# Patient Record
Sex: Female | Born: 1970 | Race: White | Hispanic: Yes | Marital: Married | Smoking: Former smoker
Health system: Southern US, Community
[De-identification: ages and names within clinical notes are randomized; demographics above are authoritative.]

## PROBLEM LIST (undated history)

## (undated) DIAGNOSIS — F329 Major depressive disorder, single episode, unspecified: Secondary | ICD-10-CM

## (undated) DIAGNOSIS — E119 Type 2 diabetes mellitus without complications: Secondary | ICD-10-CM

## (undated) DIAGNOSIS — Z9889 Other specified postprocedural states: Secondary | ICD-10-CM

## (undated) DIAGNOSIS — G5603 Carpal tunnel syndrome, bilateral upper limbs: Secondary | ICD-10-CM

## (undated) DIAGNOSIS — R112 Nausea with vomiting, unspecified: Secondary | ICD-10-CM

## (undated) DIAGNOSIS — G4730 Sleep apnea, unspecified: Secondary | ICD-10-CM

## (undated) DIAGNOSIS — K219 Gastro-esophageal reflux disease without esophagitis: Secondary | ICD-10-CM

## (undated) DIAGNOSIS — F419 Anxiety disorder, unspecified: Secondary | ICD-10-CM

## (undated) DIAGNOSIS — F32A Depression, unspecified: Secondary | ICD-10-CM

## (undated) DIAGNOSIS — Z8489 Family history of other specified conditions: Secondary | ICD-10-CM

## (undated) DIAGNOSIS — E669 Obesity, unspecified: Secondary | ICD-10-CM

## (undated) DIAGNOSIS — R011 Cardiac murmur, unspecified: Secondary | ICD-10-CM

## (undated) DIAGNOSIS — E039 Hypothyroidism, unspecified: Secondary | ICD-10-CM

## (undated) DIAGNOSIS — R42 Dizziness and giddiness: Secondary | ICD-10-CM

## (undated) DIAGNOSIS — K760 Fatty (change of) liver, not elsewhere classified: Secondary | ICD-10-CM

## (undated) DIAGNOSIS — E785 Hyperlipidemia, unspecified: Secondary | ICD-10-CM

## (undated) DIAGNOSIS — T8859XA Other complications of anesthesia, initial encounter: Secondary | ICD-10-CM

## (undated) DIAGNOSIS — I10 Essential (primary) hypertension: Secondary | ICD-10-CM

## (undated) DIAGNOSIS — T4145XA Adverse effect of unspecified anesthetic, initial encounter: Secondary | ICD-10-CM

## (undated) HISTORY — PX: ABDOMINAL HYSTERECTOMY: SHX81

## (undated) HISTORY — DX: Essential (primary) hypertension: I10

## (undated) HISTORY — PX: HYSTERECTOMY ABDOMINAL WITH SALPINGECTOMY: SHX6725

## (undated) HISTORY — PX: OVARIAN CYST REMOVAL: SHX89

## (undated) HISTORY — DX: Sleep apnea, unspecified: G47.30

## (undated) HISTORY — DX: Obesity, unspecified: E66.9

## (undated) HISTORY — DX: Anxiety disorder, unspecified: F41.9

## (undated) HISTORY — DX: Depression, unspecified: F32.A

## (undated) HISTORY — PX: TUBAL LIGATION: SHX77

## (undated) HISTORY — DX: Carpal tunnel syndrome, bilateral upper limbs: G56.03

## (undated) HISTORY — PX: EYE SURGERY: SHX253

## (undated) HISTORY — DX: Major depressive disorder, single episode, unspecified: F32.9

## (undated) HISTORY — DX: Hyperlipidemia, unspecified: E78.5

---

## 1993-03-18 HISTORY — PX: CHOLECYSTECTOMY: SHX55

## 2001-03-18 HISTORY — PX: BREAST SURGERY: SHX581

## 2001-03-18 HISTORY — PX: TONSILLECTOMY AND ADENOIDECTOMY: SUR1326

## 2006-07-25 ENCOUNTER — Ambulatory Visit: Payer: Self-pay | Admitting: Urology

## 2006-08-04 ENCOUNTER — Ambulatory Visit: Payer: Self-pay | Admitting: Unknown Physician Specialty

## 2006-08-12 ENCOUNTER — Ambulatory Visit: Payer: Self-pay | Admitting: Unknown Physician Specialty

## 2006-08-27 ENCOUNTER — Ambulatory Visit: Payer: Self-pay | Admitting: Unknown Physician Specialty

## 2006-12-10 ENCOUNTER — Ambulatory Visit: Payer: Self-pay | Admitting: Internal Medicine

## 2008-07-18 ENCOUNTER — Ambulatory Visit: Payer: Self-pay | Admitting: Internal Medicine

## 2008-08-12 ENCOUNTER — Ambulatory Visit: Payer: Self-pay | Admitting: Internal Medicine

## 2008-08-19 ENCOUNTER — Ambulatory Visit: Payer: Self-pay | Admitting: Internal Medicine

## 2009-03-18 DIAGNOSIS — G4730 Sleep apnea, unspecified: Secondary | ICD-10-CM

## 2009-03-18 HISTORY — DX: Sleep apnea, unspecified: G47.30

## 2009-11-16 LAB — HM PAP SMEAR

## 2011-03-26 ENCOUNTER — Ambulatory Visit (INDEPENDENT_AMBULATORY_CARE_PROVIDER_SITE_OTHER): Payer: 59 | Admitting: Internal Medicine

## 2011-03-26 ENCOUNTER — Encounter: Payer: Self-pay | Admitting: Internal Medicine

## 2011-03-26 VITALS — BP 134/90 | HR 76 | Temp 98.3°F | Wt 205.0 lb

## 2011-03-26 DIAGNOSIS — Z124 Encounter for screening for malignant neoplasm of cervix: Secondary | ICD-10-CM

## 2011-03-26 DIAGNOSIS — F329 Major depressive disorder, single episode, unspecified: Secondary | ICD-10-CM | POA: Insufficient documentation

## 2011-03-26 DIAGNOSIS — F32A Depression, unspecified: Secondary | ICD-10-CM | POA: Insufficient documentation

## 2011-03-26 DIAGNOSIS — I10 Essential (primary) hypertension: Secondary | ICD-10-CM

## 2011-03-26 DIAGNOSIS — Z87891 Personal history of nicotine dependence: Secondary | ICD-10-CM | POA: Insufficient documentation

## 2011-03-26 DIAGNOSIS — G5603 Carpal tunnel syndrome, bilateral upper limbs: Secondary | ICD-10-CM | POA: Insufficient documentation

## 2011-03-26 DIAGNOSIS — E785 Hyperlipidemia, unspecified: Secondary | ICD-10-CM

## 2011-03-26 DIAGNOSIS — E1169 Type 2 diabetes mellitus with other specified complication: Secondary | ICD-10-CM | POA: Insufficient documentation

## 2011-03-26 DIAGNOSIS — Z716 Tobacco abuse counseling: Secondary | ICD-10-CM

## 2011-03-26 DIAGNOSIS — Z7189 Other specified counseling: Secondary | ICD-10-CM

## 2011-03-26 DIAGNOSIS — E669 Obesity, unspecified: Secondary | ICD-10-CM | POA: Insufficient documentation

## 2011-03-26 DIAGNOSIS — G4733 Obstructive sleep apnea (adult) (pediatric): Secondary | ICD-10-CM | POA: Insufficient documentation

## 2011-03-26 LAB — BASIC METABOLIC PANEL
BUN: 10 mg/dL (ref 6–23)
Calcium: 9.3 mg/dL (ref 8.4–10.5)
Chloride: 104 mEq/L (ref 96–112)
Creatinine, Ser: 0.7 mg/dL (ref 0.4–1.2)
GFR: 95.66 mL/min (ref >60.00)

## 2011-03-26 LAB — MICROALBUMIN / CREATININE URINE RATIO
Creatinine,U: 41.2 mg/dL
Microalb Creat Ratio: 1.5 mg/g (ref 0.0–30.0)
Microalb, Ur: 0.6 mg/dL (ref 0.0–1.9)

## 2011-03-26 MED ORDER — VARENICLINE TARTRATE 0.5 MG PO TABS: 0.5000 mg | ORAL_TABLET | Freq: Two times a day (BID) | ORAL | Status: AC

## 2011-03-26 MED ORDER — HYDROCHLOROTHIAZIDE 25 MG PO TABS: 25.0000 mg | ORAL_TABLET | Freq: Every day | ORAL | Status: DC

## 2011-03-26 NOTE — Patient Instructions (Signed)
Your goal bp is < 130/80.   Return in one month fasting labs.

## 2011-03-26 NOTE — Assessment & Plan Note (Signed)
With prior elevated readings,   Checking BMET and urine for protein.  Starting with hctz.  Repeat labs in one month. Has history of OSA but wears her CPAP nightly.

## 2011-03-26 NOTE — Progress Notes (Signed)
Subjective:    Patient ID: Christina Stafford, female    DOB: Mar 06, 1971, 41 y.o.   MRN: [id number]  HPI  41 yo white female with history of obesity, elevated blood pressure readings without diagnosis of hypertension presents with recent development of visual changes, occurring on 3 separate occasions.  The fiirst occurred in a fastinng state. 2nd during dinner  Hared to read.  3rd time not sure of circumstances.  She describes the change is a waviness ot her vision,  Affected both eyes,. And lasted 10 to 15 minutes resolved spontaneously.  No headaches, sinus congestion or dizzy spells. She ahs an eye appt next week.  Last episode occurred about 2 weeks ago.  Past Medical History  Diagnosis Date  . Sleep apnea 2011    using CPAP 6 cm H20  [facility] Sleep Study  . Carpal tunnel syndrome on both sides   . Depression   . Hyperlipidemia   . Hypertension   . Obesity    No current outpatient prescriptions on file prior to visit.    Review of Systems  Constitutional: Negative for fever, chills and unexpected weight change.  HENT: Negative for hearing loss, ear pain, nosebleeds, congestion, sore throat, facial swelling, rhinorrhea, sneezing, mouth sores, trouble swallowing, neck pain, neck stiffness, voice change, postnasal drip, sinus pressure, tinnitus and ear discharge.   Eyes: Negative for pain, discharge, redness and visual disturbance.  Respiratory: Negative for cough, chest tightness, shortness of breath, wheezing and stridor.   Cardiovascular: Negative for chest pain, palpitations and leg swelling.  Musculoskeletal: Negative for myalgias and arthralgias.  Skin: Negative for color change and rash.  Neurological: Negative for dizziness, weakness, light-headedness and headaches.  Hematological: Negative for adenopathy.   BP 134/90  Pulse 76  Temp(Src) 98.3 F (36.8 C) (Oral)  Wt 205 lb (92.987 kg)     Objective:   Physical Exam  Constitutional: She is oriented to person, place, and  time. She appears well-developed and well-nourished.  HENT:  Mouth/Throat: Oropharynx is clear and moist.  Eyes: EOM are normal. Pupils are equal, round, and reactive to light. No scleral icterus.  Neck: Normal range of motion. Neck supple. No JVD present. No thyromegaly present.  Cardiovascular: Normal rate, regular rhythm, normal heart sounds and intact distal pulses.   Pulmonary/Chest: Effort normal and breath sounds normal.  Abdominal: Soft. Bowel sounds are normal. She exhibits no mass. There is no tenderness.  Musculoskeletal: Normal range of motion. She exhibits no edema.  Lymphadenopathy:    She has no cervical adenopathy.  Neurological: She is alert and oriented to person, place, and time.  Skin: Skin is warm and dry.  Psychiatric: She has a normal mood and affect.      Assessment & Plan:   Tobacco abuse counseling She has been using tobacco intermittently for 20 years and has had prior cessation for months at a time. Spent 5 minutes discussing mehtods of quitting.  Rx for chantix starter pack given with discussion of possible adverse effects done.   Hypertension With prior elevated readings,   Checking BMET and urine for protein.  Starting with hctz.  Repeat labs in one month. Has history of OSA but wears her CPAP nightly.     Updated Medication List Outpatient Encounter Prescriptions as of 03/26/2011  Medication Sig Dispense Refill  . cetirizine (ZYRTEC) 10 MG tablet Take one by mouth daily when needed.       Marland Kitchen omeprazole (PRILOSEC) 20 MG capsule Take 20 mg by mouth daily.        Marland Kitchen  hydrochlorothiazide (HYDRODIURIL) 25 MG tablet Take 1 tablet (25 mg total) by mouth daily.  30 tablet  11  . varenicline (CHANTIX) 0.5 MG tablet Take 1 tablet (0.5 mg total) by mouth 2 (two) times daily. Qty sufficient for starting month pack  60 tablet  1

## 2011-03-26 NOTE — Assessment & Plan Note (Addendum)
She has been using tobacco intermittently for 20 years and has had prior cessation for months at a time. Spent 5 minutes discussing mehtods of quitting.  Rx for chantix starter pack given with discussion of possible adverse effects done.

## 2011-03-29 ENCOUNTER — Telehealth: Payer: Self-pay | Admitting: *Deleted

## 2011-03-29 NOTE — Telephone Encounter (Signed)
Advised pt, she will try this and will call and let us know how taking half a day works.

## 2011-03-29 NOTE — Telephone Encounter (Signed)
Pt was given hctz on Tuesday.  She started this on Wednesday and had no problems.  She took it yesterday with lunch and felt dizzy, lightheaded and had nauseated.  She has not taken any today.  She's taking 25 mg's a day.  Should she decrease the dose until she gets used to it?

## 2011-03-29 NOTE — Telephone Encounter (Signed)
Yes she can cut it in half and take it once daily. If still a problem, stop it.

## 2011-04-02 ENCOUNTER — Telehealth: Payer: Self-pay | Admitting: *Deleted

## 2011-04-02 NOTE — Telephone Encounter (Signed)
Patient called to let you know that she has been taking 1/2 of the hctz since Friday and so far everything is good. She has not had any trouble with her BP or dizziness. She is asking if she should continue taking it this way.

## 2011-04-02 NOTE — Telephone Encounter (Signed)
Yes, continue 1/2 tablet daily

## 2011-04-03 NOTE — Telephone Encounter (Signed)
Patient notified

## 2011-05-09 ENCOUNTER — Encounter: Payer: Self-pay | Admitting: Internal Medicine

## 2011-05-09 ENCOUNTER — Ambulatory Visit (INDEPENDENT_AMBULATORY_CARE_PROVIDER_SITE_OTHER): Payer: 59 | Admitting: Internal Medicine

## 2011-05-09 DIAGNOSIS — R51 Headache: Secondary | ICD-10-CM

## 2011-05-09 DIAGNOSIS — Z716 Tobacco abuse counseling: Secondary | ICD-10-CM

## 2011-05-09 DIAGNOSIS — Z7189 Other specified counseling: Secondary | ICD-10-CM

## 2011-05-09 DIAGNOSIS — R519 Headache, unspecified: Secondary | ICD-10-CM

## 2011-05-09 DIAGNOSIS — E785 Hyperlipidemia, unspecified: Secondary | ICD-10-CM

## 2011-05-09 DIAGNOSIS — I10 Essential (primary) hypertension: Secondary | ICD-10-CM

## 2011-05-09 LAB — COMPREHENSIVE METABOLIC PANEL
ALT: 18 U/L (ref 0–35)
AST: 18 U/L (ref 0–37)
Alkaline Phosphatase: 74 U/L (ref 39–117)
BUN: 11 mg/dL (ref 6–23)
Calcium: 9.5 mg/dL (ref 8.4–10.5)
Chloride: 103 mEq/L (ref 96–112)
Creatinine, Ser: 0.8 mg/dL (ref 0.4–1.2)
Total Bilirubin: 0.2 mg/dL — ABNORMAL LOW (ref 0.3–1.2)

## 2011-05-09 LAB — LIPID PANEL
HDL: 39.6 mg/dL (ref >39.00)
Triglycerides: 224 mg/dL — ABNORMAL HIGH (ref 0.0–149.0)
VLDL: 44.8 mg/dL — ABNORMAL HIGH (ref 0.0–40.0)

## 2011-05-09 LAB — TSH: TSH: 0.88 u[IU]/mL (ref 0.35–5.50)

## 2011-05-09 LAB — LDL CHOLESTEROL, DIRECT: Direct LDL: 210.6 mg/dL

## 2011-05-09 NOTE — Patient Instructions (Addendum)
Congratulations on quitting tobacco!!  I will e amil you your labs tomorrow,  Make sure you sign up for MyChart tonight

## 2011-05-09 NOTE — Progress Notes (Signed)
Subjective:    Patient ID: Christina Stafford, female    DOB: 04-13-1970, 41 y.o.   MRN: [id number]  HPI  [name] is a 41 yr old white female with a history of hypertension and toacco abuse who presents for one month follow up.  She stopped smoking 5 days ago.  She developed new onset headaches starting the month before Christmas.   The headaches are not daily,  She had a severe headache on Monday through Tuesday.  Her headache resolved on Tuesday.  No neurologic  Symptoms or visual changes.   Past Medical History  Diagnosis Date  . Sleep apnea 2011    using CPAP 6 cm H20  [facility] Sleep Study  . Carpal tunnel syndrome on both sides   . Depression   . Hyperlipidemia   . Hypertension   . Obesity    Current Outpatient Prescriptions on File Prior to Visit  Medication Sig Dispense Refill  . cetirizine (ZYRTEC) 10 MG tablet Take one by mouth daily when needed.       Marland Kitchen omeprazole (PRILOSEC) 20 MG capsule Take 20 mg by mouth daily.        . varenicline (CHANTIX) 0.5 MG tablet Take 1 tablet (0.5 mg total) by mouth 2 (two) times daily. Qty sufficient for starting month pack  60 tablet  1     Review of Systems  Constitutional: Negative for fever, chills and unexpected weight change.  HENT: Negative for hearing loss, ear pain, nosebleeds, congestion, sore throat, facial swelling, rhinorrhea, sneezing, mouth sores, trouble swallowing, neck pain, neck stiffness, voice change, postnasal drip, sinus pressure, tinnitus and ear discharge.   Eyes: Negative for pain, discharge, redness and visual disturbance.  Respiratory: Negative for cough, chest tightness, shortness of breath, wheezing and stridor.   Cardiovascular: Negative for chest pain, palpitations and leg swelling.  Musculoskeletal: Negative for myalgias and arthralgias.  Skin: Negative for color change and rash.  Neurological: Negative for dizziness, weakness, light-headedness and headaches.  Hematological: Negative for adenopathy.      Objective:   Physical Exam  Constitutional: She is oriented to person, place, and time. She appears well-developed and well-nourished.  HENT:  Mouth/Throat: Oropharynx is clear and moist.  Eyes: EOM are normal. Pupils are equal, round, and reactive to light. No scleral icterus.  Neck: Normal range of motion. Neck supple. No JVD present. No thyromegaly present.  Cardiovascular: Normal rate, regular rhythm, normal heart sounds and intact distal pulses.   Pulmonary/Chest: Effort normal and breath sounds normal.  Abdominal: Soft. Bowel sounds are normal. She exhibits no mass. There is no tenderness.  Musculoskeletal: Normal range of motion. She exhibits no edema.  Lymphadenopathy:    She has no cervical adenopathy.  Neurological: She is alert and oriented to person, place, and time.  Skin: Skin is warm and dry.  Psychiatric: She has a normal mood and affect.       Assessment & Plan:   Hyperlipidemia Her untreated LDL is 210, and triglycerides are over 200 as well.  Will try red yeast rice  For a few months  And recheck.   Tobacco abuse counseling She has stopped smoking.  Encouragement given.   Headache in front of head New onset. aggravated by nicotine withdrawal.  Without neurologic symptoms will defer MRI for now and treat tension and nicotine withdrawal.     Updated Medication List Outpatient Encounter Prescriptions as of 05/09/2011  Medication Sig Dispense Refill  . cetirizine (ZYRTEC) 10 MG tablet Take one by mouth  daily when needed.       . hydrochlorothiazide (HYDRODIURIL) 25 MG tablet Take one half by mouth daily      . omeprazole (PRILOSEC) 20 MG capsule Take 20 mg by mouth daily.        Marland Kitchen DISCONTD: hydrochlorothiazide (HYDRODIURIL) 25 MG tablet Take 1 tablet (25 mg total) by mouth daily.  30 tablet  11  . varenicline (CHANTIX) 0.5 MG tablet Take 1 tablet (0.5 mg total) by mouth 2 (two) times daily. Qty sufficient for starting month pack  60 tablet  1

## 2011-05-10 ENCOUNTER — Telehealth: Payer: Self-pay | Admitting: *Deleted

## 2011-05-10 ENCOUNTER — Encounter: Payer: Self-pay | Admitting: Internal Medicine

## 2011-05-10 NOTE — Telephone Encounter (Signed)
Mychart message sent.

## 2011-05-10 NOTE — Telephone Encounter (Signed)
Pt received the labs that you released and wants to know if she needs medication OR if she needs follow up office visit to discuss.

## 2011-05-10 NOTE — Telephone Encounter (Signed)
In my message to her via mychart,  i suggested red yeast rice 500 mg bid and repeat  In 6 months.  Did she not get the message ?  Am I not doing something right? Again?

## 2011-05-12 ENCOUNTER — Encounter: Payer: Self-pay | Admitting: Internal Medicine

## 2011-05-12 DIAGNOSIS — R519 Headache, unspecified: Secondary | ICD-10-CM | POA: Insufficient documentation

## 2011-05-12 NOTE — Assessment & Plan Note (Signed)
She has stopped smoking.  Encouragement given.

## 2011-05-12 NOTE — Assessment & Plan Note (Signed)
New onset. aggravated by nicotine withdrawal.  Without neurologic symptoms will defer MRI for now and treat tension and nicotine withdrawal.

## 2011-05-12 NOTE — Assessment & Plan Note (Addendum)
Her untreated LDL is 210, and triglycerides are over 200 as well.  Will try red yeast rice  For a few months  And recheck.

## 2011-05-13 NOTE — Telephone Encounter (Signed)
See phone note

## 2011-12-24 ENCOUNTER — Encounter: Payer: Self-pay | Admitting: Internal Medicine

## 2012-01-11 ENCOUNTER — Emergency Department: Payer: Self-pay | Admitting: Emergency Medicine

## 2012-01-21 ENCOUNTER — Encounter: Payer: Self-pay | Admitting: Internal Medicine

## 2012-01-21 ENCOUNTER — Ambulatory Visit (INDEPENDENT_AMBULATORY_CARE_PROVIDER_SITE_OTHER): Payer: 59 | Admitting: Internal Medicine

## 2012-01-21 VITALS — BP 114/68 | HR 81 | Temp 99.1°F | Ht 63.0 in | Wt 200.0 lb

## 2012-01-21 DIAGNOSIS — N926 Irregular menstruation, unspecified: Secondary | ICD-10-CM

## 2012-01-21 DIAGNOSIS — Z23 Encounter for immunization: Secondary | ICD-10-CM

## 2012-01-21 DIAGNOSIS — E785 Hyperlipidemia, unspecified: Secondary | ICD-10-CM

## 2012-01-21 DIAGNOSIS — G4730 Sleep apnea, unspecified: Secondary | ICD-10-CM

## 2012-01-21 NOTE — Progress Notes (Signed)
Patient ID: Christina Stafford, female   DOB: [birth year], 41 y.o.   MRN: [id number]   Patient Active Problem List  Diagnosis  . Sleep apnea  . Carpal tunnel syndrome on both sides  . Tobacco abuse counseling  . Screening for cervical cancer  . Depression  . Hyperlipidemia  . Hypertension  . Obesity  . Headache in front of head  . Menstrual irregularity    Subjective:  CC:   Chief Complaint  Patient presents with  . Menstrual Problem    flu shot    HPI: St one was  Christina Stafford a 41 y.o. female who presents 1) recent onset of irregular periods, started 2 months ago. Her first one was 10 days late followed a second one occurring early so she was back on track after after 2 months. No changes in medications. No positive pregnancy tests.  2) 2 weeks ago woke up from a 1 hour nap which she took without her CPAP and woke up with neurologic symptoms including numbness of the lips and lips have felt like they wouldn't cooperate. She also noted that her hands were cramping up. She went to the ER for evaluation after a thorough neurologic evaluation without CT scan was given a dose of Xanax in symptoms promptly resolved.    Past Medical History  Diagnosis Date  . Sleep apnea 2011    using CPAP 6 cm H20  [facility] Sleep Study  . Carpal tunnel syndrome on both sides   . Depression   . Hyperlipidemia   . Hypertension   . Obesity     Past Surgical History  Procedure Date  . Breast surgery 2003    negative biopsy  . Cholecystectomy 1995  . Tonsillectomy and adenoidectomy 2003         The following portions of the patient's history were reviewed and updated as appropriate: Allergies, current medications, and problem list.    Review of Systems:   12 Pt  review of systems was negative except those addressed in the HPI,     History   Social History  . Marital Status: Married    Spouse Name: N/A    Number of Children: N/A  . Years of Education: N/A    Occupational History  . Not on file.   Social History Main Topics  . Smoking status: Current Every Day Smoker  . Smokeless tobacco: Not on file  . Alcohol Use: Not on file  . Drug Use: Not on file  . Sexually Active: Not on file   Other Topics Concern  . Not on file   Social History Narrative  . No narrative on file    Objective:  BP 114/68  Pulse 81  Temp 99.1 F (37.3 C) (Oral)  Ht 5' 3"  (1.6 m)  Wt 200 lb (90.719 kg)  BMI 35.43 kg/m2  SpO2 97%  LMP 12/21/2011  General appearance: alert, cooperative and appears stated age Ears: normal TM's and external ear canals both ears Throat: lips, mucosa, and tongue normal; teeth and gums normal Neck: no adenopathy, no carotid bruit, supple, symmetrical, trachea midline and thyroid not enlarged, symmetric, no tenderness/mass/nodules Back: symmetric, no curvature. ROM normal. No CVA tenderness. Lungs: clear to auscultation bilaterally Heart: regular rate and rhythm, S1, S2 normal, no murmur, click, rub or gallop Abdomen: soft, non-tender; bowel sounds normal; no masses,  no organomegaly Pulses: 2+ and symmetric Skin: Skin color, texture, turgor normal. No rashes or lesions Lymph nodes: Cervical, supraclavicular, and axillary nodes normal.  Neuro: CNS 2-12 intact, finger to nose intact. No ataxia . Sensation intact.   Assessment and Plan:  Menstrual irregularity Ideology may be due to development of polycystic ovarian syndrome given her obesity versus early menopause. Thyroid function was normal today. Since her symptoms have only been occurring for 2 months suggested delaying workup for now.   Sleep apnea Her recent episode of acrocyanosis was more likely caused by a hypoxic event  during her Pap was taken without a seatbelt.. Reassurance provided. Neurologic exam was normal  Hyperlipidemia Uncontrolled. LDL is 218, and triglycerides are over 250. Will recommend treatment given her excessively high LDL obesity and probable  metabolic syndrome.   Updated Medication List Outpatient Encounter Prescriptions as of 01/21/2012  Medication Sig Dispense Refill  . cetirizine (ZYRTEC) 10 MG tablet Take one by mouth daily when needed.       . hydrochlorothiazide (HYDRODIURIL) 25 MG tablet Take one half by mouth daily      . omeprazole (PRILOSEC) 20 MG capsule Take 20 mg by mouth daily.

## 2012-01-21 NOTE — Patient Instructions (Addendum)
This is  Dr. Cammy Brochure version of a  "Low GI"  Diet:  All of the foods can be found at grocery stores and in bulk at [employer].  The Atkins protein bars and shakes are available in more varieties at [employer], [employer] and [place].     7 AM Breakfast:  Low carbohydrate Protein  Shakes (I recommend the EAS AdvantEdge "Carb Control" shakes  Or the low carb shakes by Atkins.   Both are available everywhere:  In  cases at [employer]  Or in 4 packs at grocery stores and pharmacies  2.5 carbs  (Alternative is  a toasted Arnold's Sandwhich Thin w/ peanut butter, a "Bagel Thin" with cream cheese and salmon) or  a scrambled egg burrito made with a low carb tortilla .  Avoid cereal and bananas, oatmeal too unless you are cooking the old fashioned kind that takes 30-40 minutes to prepare.  the rest is overly processed, has minimal fiber, and is loaded with carbohydrates!   10 AM: Protein bar by Atkins (the snack size, under 200 cal).  There are many varieties , available widely again or in bulk in limited varieties at [employer])  Other so called "protein bars" tend to be loaded with carbohydrates.  Remember, in food advertising, the word "energy" is synonymous for " carbohydrate."  Lunch: sandwich of [place], (or any lunchmeat, grilled meat or canned tuna), fresh avocado, mayonnaise  and cheese on a lower carbohydrate pita bread, flatbread, or tortilla . Ok to use regular mayonnaise. The bread is the only source or carbohydrate that can be decreased (Joseph's makes a pita bread and a flat bread that are 50 cal and 4 net carbs ; Toufayan makes a low carb flatbread that's 100 cal and 9 net carbs  and  [place] makes a low carb whole wheat tortilla  That is 210 cal and 6 net carbs)  3 PM:  Mid day :  Another protein bar,  Or a  cheese stick (100 cal, 0 carbs),  Or 1 ounce of  almonds, walnuts, pistachios, pecans, peanuts,  Macadamia nuts. Or a Dannon light n Fit greek yogurt, 80 cal 8 net carbs . Avoid "granola"; the dried cranberries  and raisins are loaded with carbohydrates. Mixed nuts ok if no raisins or cranberries or dried fruit.      6 PM  Dinner:  "mean and green:"  Meat/chicken/fish or a high protein legume; , with a green salad, and a low GI  Veggie (broccoli, cauliflower, green beans, spinach, brussel sprouts. Lima beans) : Avoid "Low fat dressings, as well as [place]! They are loaded with sugar! Instead use ranch, vinagrette,  Blue cheese, etc  9 PM snack : Breyer's "low carb" fudgsicle or  ice cream bar (Carb Smart line), or  Weight Watcher's ice cream bar , or another "no sugar added" ice cream;a serving of fresh berries/cherries with whipped cream (Avoid bananas, pineapple, grapes  and watermelon on a regular basis because they are high in sugar)   Remember that snack Substitutions should be less than 15 to 20 carbs  Per serving. Remember to subtract fiber grams and sugar alcohols to get the "net carbs."

## 2012-01-22 ENCOUNTER — Other Ambulatory Visit (INDEPENDENT_AMBULATORY_CARE_PROVIDER_SITE_OTHER): Payer: 59

## 2012-01-22 DIAGNOSIS — Z0000 Encounter for general adult medical examination without abnormal findings: Secondary | ICD-10-CM

## 2012-01-22 LAB — COMPREHENSIVE METABOLIC PANEL
ALT: 25 U/L (ref 0–35)
CO2: 25 mEq/L (ref 19–32)
Calcium: 9.3 mg/dL (ref 8.4–10.5)
Chloride: 103 mEq/L (ref 96–112)
GFR: 78.48 mL/min (ref >60.00)
Glucose, Bld: 103 mg/dL — ABNORMAL HIGH (ref 70–99)
Sodium: 137 mEq/L (ref 135–145)
Total Protein: 7.6 g/dL (ref 6.0–8.3)

## 2012-01-22 LAB — CBC WITH DIFFERENTIAL/PLATELET
Basophils Relative: 0.5 % (ref 0.0–3.0)
Eosinophils Absolute: 0.1 10*3/uL (ref 0.0–0.7)
HCT: 42.2 % (ref 36.0–46.0)
Hemoglobin: 14.3 g/dL (ref 12.0–15.0)
Lymphs Abs: 2.4 10*3/uL (ref 0.7–4.0)
MCHC: 33.8 g/dL (ref 30.0–36.0)
MCV: 90.5 fl (ref 78.0–100.0)
Monocytes Absolute: 0.4 10*3/uL (ref 0.1–1.0)
Neutro Abs: 4.5 10*3/uL (ref 1.4–7.7)
RBC: 4.67 Mil/uL (ref 3.87–5.11)
RDW: 13.8 % (ref 11.5–14.6)

## 2012-01-22 LAB — LIPID PANEL: Cholesterol: 311 mg/dL — ABNORMAL HIGH (ref 0–200)

## 2012-01-23 ENCOUNTER — Encounter: Payer: Self-pay | Admitting: Internal Medicine

## 2012-01-23 ENCOUNTER — Telehealth: Payer: Self-pay | Admitting: Internal Medicine

## 2012-01-23 DIAGNOSIS — N926 Irregular menstruation, unspecified: Secondary | ICD-10-CM | POA: Insufficient documentation

## 2012-01-23 DIAGNOSIS — E785 Hyperlipidemia, unspecified: Secondary | ICD-10-CM

## 2012-01-23 DIAGNOSIS — Z79899 Other long term (current) drug therapy: Secondary | ICD-10-CM

## 2012-01-23 MED ORDER — SIMVASTATIN 20 MG PO TABS: 20.0000 mg | ORAL_TABLET | Freq: Every day | ORAL | Status: DC

## 2012-01-23 NOTE — Assessment & Plan Note (Addendum)
Ideology may be due to development of polycystic ovarian syndrome given her obesity versus early menopause. Thyroid function was normal today. Since her symptoms have only been occurring for 2 months suggested delaying workup for now.

## 2012-01-23 NOTE — Telephone Encounter (Signed)
Christina Stafford thyroid function and CBC were normal. Actually everything was normal except for cholesterol which was through the roof at 311. LDL was 218 and triglycerides are 250; I recommend that we start treating this. If she can lose enough weight to drop Christina Stafford cholesterol even lower we can stop the statin at some point.  I'm prescribing simvastatin 20 mg one tablet daily at bedtime. She can return in 3 months for repeat fasting lipids and see metastases.

## 2012-01-23 NOTE — Assessment & Plan Note (Signed)
Her recent episode of acrocyanosis was more likely caused by a hypoxic event  during her Pap was taken without a seatbelt.. Reassurance provided. Neurologic exam was normal

## 2012-01-23 NOTE — Assessment & Plan Note (Signed)
Uncontrolled. LDL is 218, and triglycerides are over 250. Will recommend treatment given her excessively high LDL obesity and probable metabolic syndrome.

## 2012-01-24 NOTE — Telephone Encounter (Signed)
Notified patient as directed.

## 2012-02-25 ENCOUNTER — Encounter: Payer: Self-pay | Admitting: Adult Health

## 2012-02-25 ENCOUNTER — Ambulatory Visit (INDEPENDENT_AMBULATORY_CARE_PROVIDER_SITE_OTHER): Payer: 59 | Admitting: Adult Health

## 2012-02-25 VITALS — BP 128/86 | HR 72 | Temp 98.6°F | Ht 63.0 in | Wt 200.0 lb

## 2012-02-25 DIAGNOSIS — N390 Urinary tract infection, site not specified: Secondary | ICD-10-CM

## 2012-02-25 LAB — POCT URINALYSIS DIPSTICK
Bilirubin, UA: NEGATIVE
Glucose, UA: NEGATIVE
Ketones, UA: NEGATIVE
Spec Grav, UA: <=1.005
Urobilinogen, UA: 0.2

## 2012-02-25 MED ORDER — SULFAMETHOXAZOLE-TRIMETHOPRIM 800-160 MG PO TABS: 1.0000 | ORAL_TABLET | Freq: Two times a day (BID) | ORAL | Status: DC

## 2012-02-25 NOTE — Patient Instructions (Addendum)
Please have labs done prior to leaving the office. The results will be available through My Chart for your convenience.  Drink plenty of liquids.   Start Bactrim DS today and take until complete. Please call if symptoms do not improve within 2-3 days.  You could also take pyridium (AZO, also have other products with different names) for symptoms of burning with urination. This medication turns your urine orange.

## 2012-02-25 NOTE — Progress Notes (Signed)
  Subjective:    Patient ID: Christina Stafford, female    DOB: 1970/05/20, 41 y.o.   MRN: [id number]  HPI  Patient is a 41 y/o female who presents to clinic today with symptoms of low back discomfort and burning with urination. Denies fever, chills or other systemic symptoms.  Current Outpatient Prescriptions on File Prior to Visit  Medication Sig Dispense Refill  . hydrochlorothiazide (HYDRODIURIL) 25 MG tablet Take one half by mouth daily      . omeprazole (PRILOSEC) 20 MG capsule Take 20 mg by mouth daily.        . simvastatin (ZOCOR) 20 MG tablet Take 1 tablet (20 mg total) by mouth at bedtime.  90 tablet  3  . cetirizine (ZYRTEC) 10 MG tablet Take one by mouth daily when needed.         Review of Systems  Constitutional: Negative for fever, chills and fatigue.  Respiratory: Negative.   Cardiovascular: Negative.   Genitourinary: Positive for dysuria, flank pain and pelvic pain.  Neurological: Negative.   Psychiatric/Behavioral: Negative.     BP 128/86  Pulse 72  Temp 98.6 F (37 C) (Oral)  Ht 5' 3"  (1.6 m)  Wt 200 lb (90.719 kg)  BMI 35.43 kg/m2  SpO2 99%  LMP 01/26/2012    Objective:   Physical Exam  Constitutional: She is oriented to person, place, and time. She appears well-developed and well-nourished. No distress.  Cardiovascular: Normal rate, regular rhythm and normal heart sounds.   No murmur heard. Pulmonary/Chest: Effort normal and breath sounds normal.  Abdominal: Soft. Bowel sounds are normal. She exhibits no mass. There is tenderness. There is no rebound and no guarding.  Lymphadenopathy:    She has no cervical adenopathy.  Neurological: She is alert and oriented to person, place, and time.  Skin: Skin is warm and dry.  Psychiatric: She has a normal mood and affect. Her behavior is normal. Judgment and thought content normal.       Assessment & Plan:

## 2012-02-25 NOTE — Assessment & Plan Note (Signed)
Start Bactrim DS bid x 3 days. May use pyridium for dysuria. Follow up if no relief in 2-3 days or if symptoms worsen.

## 2012-02-27 LAB — URINE CULTURE
Colony Count: NO GROWTH
Organism ID, Bacteria: NO GROWTH

## 2012-05-14 ENCOUNTER — Telehealth: Payer: Self-pay | Admitting: Internal Medicine

## 2012-05-14 DIAGNOSIS — R519 Headache, unspecified: Secondary | ICD-10-CM

## 2012-05-14 DIAGNOSIS — I10 Essential (primary) hypertension: Secondary | ICD-10-CM

## 2012-05-14 NOTE — Telephone Encounter (Signed)
Patient wanting labs done prior to her appointment on 3.11.14. She has been scheduled for labs on 3.6.14.

## 2012-05-14 NOTE — Telephone Encounter (Signed)
Dr. Derrel Nip,  The patient is requesting a refill for hydrodiuril 25 mg. Currently scratched through on her MAR.  Please advise.

## 2012-05-14 NOTE — Telephone Encounter (Signed)
hydrochlorothiazide (HYDRODIURIL) 25 MG tablet   #90

## 2012-05-15 MED ORDER — HYDROCHLOROTHIAZIDE 25 MG PO TABS: 12.5000 mg | ORAL_TABLET | Freq: Every day | ORAL | Status: DC

## 2012-05-15 NOTE — Telephone Encounter (Signed)
Labs are already ordered.  Refill sent. Epic updated

## 2012-05-21 ENCOUNTER — Other Ambulatory Visit (INDEPENDENT_AMBULATORY_CARE_PROVIDER_SITE_OTHER): Payer: 59

## 2012-05-21 ENCOUNTER — Encounter: Payer: Self-pay | Admitting: Internal Medicine

## 2012-05-21 DIAGNOSIS — Z79899 Other long term (current) drug therapy: Secondary | ICD-10-CM

## 2012-05-21 DIAGNOSIS — E785 Hyperlipidemia, unspecified: Secondary | ICD-10-CM

## 2012-05-21 LAB — COMPREHENSIVE METABOLIC PANEL
AST: 21 U/L (ref 0–37)
Albumin: 4 g/dL (ref 3.5–5.2)
Alkaline Phosphatase: 74 U/L (ref 39–117)
BUN: 14 mg/dL (ref 6–23)
Creatinine, Ser: 0.8 mg/dL (ref 0.4–1.2)
Glucose, Bld: 98 mg/dL (ref 70–99)
Potassium: 3.9 mEq/L (ref 3.5–5.1)
Total Bilirubin: 0.7 mg/dL (ref 0.3–1.2)

## 2012-05-21 LAB — LIPID PANEL
Cholesterol: 257 mg/dL — ABNORMAL HIGH (ref 0–200)
HDL: 43.4 mg/dL (ref >39.00)
Triglycerides: 235 mg/dL — ABNORMAL HIGH (ref 0.0–149.0)
VLDL: 47 mg/dL — ABNORMAL HIGH (ref 0.0–40.0)

## 2012-05-21 LAB — LDL CHOLESTEROL, DIRECT: Direct LDL: 176.5 mg/dL

## 2012-05-26 ENCOUNTER — Encounter: Payer: Self-pay | Admitting: Internal Medicine

## 2012-05-26 ENCOUNTER — Ambulatory Visit (INDEPENDENT_AMBULATORY_CARE_PROVIDER_SITE_OTHER): Payer: 59 | Admitting: Internal Medicine

## 2012-05-26 VITALS — BP 122/84 | HR 70 | Temp 98.5°F | Resp 12 | Wt 196.0 lb

## 2012-05-26 DIAGNOSIS — G4730 Sleep apnea, unspecified: Secondary | ICD-10-CM

## 2012-05-26 DIAGNOSIS — E785 Hyperlipidemia, unspecified: Secondary | ICD-10-CM

## 2012-05-26 DIAGNOSIS — I10 Essential (primary) hypertension: Secondary | ICD-10-CM

## 2012-05-26 DIAGNOSIS — E669 Obesity, unspecified: Secondary | ICD-10-CM

## 2012-05-28 ENCOUNTER — Encounter: Payer: Self-pay | Admitting: Internal Medicine

## 2012-05-28 NOTE — Assessment & Plan Note (Signed)
Improving on current regimen. Liver enzymes normal, no changes today. New ACC guidelines recommend statin use in patients aged 44 or higher with moderate intensity statin therapy for LDL between 70-189 and 10 yr risk of CAD > 7.5% ;  and high intensity therapy for anyone with LDL > 190.

## 2012-05-28 NOTE — Progress Notes (Signed)
Patient ID: Christina Stafford, female   DOB: [birth year], 42 y.o.   MRN: [id number]  Patient Active Problem List  Diagnosis  . Sleep apnea  . Carpal tunnel syndrome on both sides  . Tobacco abuse counseling  . Screening for cervical cancer  . Depression  . Hyperlipidemia  . Hypertension  . Obesity  . Headache in front of head  . Menstrual irregularity    Subjective:  CC:   Chief Complaint  Patient presents with  . Follow-up    Cholesterol     HPI:   Christina Stafford a 42 y.o. female who presents  For followup on hyperlipidemia hypertension and obesity with treated sleep apnea. . Patient is tolerating her medications well. Her blood pressure has been well controlled by outside checks. She has lost 9 pounds over the last year through dietary modification and regular exercise. She has no muscle aches but does note that she has frequent foot cramps after exercising. She is drinking water to rehydrate and has not tried any electrolyte replacement drinks. She remains abstinent from cigarettes. She is using her CPAP regularly every night and averaging 6-8 hours of sleep with use.   Past Medical History  Diagnosis Date  . Sleep apnea 2011    using CPAP 6 cm H20  [facility] Sleep Study  . Carpal tunnel syndrome on both sides   . Depression   . Hyperlipidemia   . Hypertension   . Obesity     Past Surgical History  Procedure Laterality Date  . Breast surgery  2003    negative biopsy  . Cholecystectomy  1995  . Tonsillectomy and adenoidectomy  2003       The following portions of the patient's history were reviewed and updated as appropriate: Allergies, current medications, and problem list.    Review of Systems:  A comprehensive ROS was done and positive for nausea.   The rest was negative.   History   Social History  . Marital Status: Married    Spouse Name: N/A    Number of Children: N/A  . Years of Education: N/A   Occupational History  . Not on file.    Social History Main Topics  . Smoking status: Former Smoker    Start date: 05/17/2011  . Smokeless tobacco: Not on file  . Alcohol Use: Not on file  . Drug Use: Not on file  . Sexually Active: Not on file   Other Topics Concern  . Not on file   Social History Narrative  . No narrative on file    Objective:  BP 122/84  Pulse 70  Temp(Src) 98.5 F (36.9 C) (Oral)  Resp 12  Wt 196 lb (88.905 kg)  BMI 34.73 kg/m2  SpO2 95%  LMP 05/26/2012  General appearance: alert, cooperative and appears stated age Ears: normal TM's and external ear canals both ears Throat: lips, mucosa, and tongue normal; teeth and gums normal Neck: no adenopathy, no carotid bruit, supple, symmetrical, trachea midline and thyroid not enlarged, symmetric, no tenderness/mass/nodules Back: symmetric, no curvature. ROM normal. No CVA tenderness. Lungs: clear to auscultation bilaterally Heart: regular rate and rhythm, S1, S2 normal, no murmur, click, rub or gallop Abdomen: soft, non-tender; bowel sounds normal; no masses,  no organomegaly Pulses: 2+ and symmetric Skin: Skin color, texture, turgor normal. No rashes or lesions Lymph nodes: Cervical, supraclavicular, and axillary nodes normal.  Assessment and Plan:  Sleep apnea Managed with daily use of CPAP. She feels well rested with use. No changes  today.  Hypertension Well controlled on current regimen. Renal function stable, no changes today.  Hyperlipidemia Improving on current regimen. Liver enzymes normal, no changes today. New ACC guidelines recommend statin use in patients aged 26 or higher with moderate intensity statin therapy for LDL between 70-189 and 10 yr risk of CAD > 7.5% ;  and high intensity therapy for anyone with LDL > 190.   Obesity Improving with current exercise and dietary modification plans. Encouragement given diet reviewed.   Updated Medication List Outpatient Encounter Prescriptions as of 05/26/2012  Medication Sig  Dispense Refill  . cetirizine (ZYRTEC) 10 MG tablet Take one by mouth daily when needed.       . hydrochlorothiazide (HYDRODIURIL) 25 MG tablet Take 0.5 tablets (12.5 mg total) by mouth daily. Take one half by mouth daily  30 tablet  5  . omeprazole (PRILOSEC) 20 MG capsule Take 20 mg by mouth daily.        . simvastatin (ZOCOR) 20 MG tablet Take 1 tablet (20 mg total) by mouth at bedtime.  90 tablet  3  . sulfamethoxazole-trimethoprim (BACTRIM DS,SEPTRA DS) 800-160 MG per tablet Take 1 tablet by mouth 2 (two) times daily. Take with food.  6 tablet  0   No facility-administered encounter medications on file as of 05/26/2012.     No orders of the defined types were placed in this encounter.    No Follow-up on file.

## 2012-05-28 NOTE — Assessment & Plan Note (Signed)
Well controlled on current regimen. Renal function stable, no changes today. 

## 2012-05-28 NOTE — Patient Instructions (Addendum)
You are doing great!  Keep up the good work on the diet and regular exercise.   We have not changed any medications today.   Return in 6 months.

## 2012-05-28 NOTE — Assessment & Plan Note (Signed)
Improving with current exercise and dietary modification plans. Encouragement given diet reviewed.

## 2012-05-28 NOTE — Assessment & Plan Note (Signed)
Managed with daily use of CPAP. She feels well rested with use. No changes today.

## 2012-07-06 ENCOUNTER — Encounter: Payer: Self-pay | Admitting: Internal Medicine

## 2012-07-06 ENCOUNTER — Ambulatory Visit (INDEPENDENT_AMBULATORY_CARE_PROVIDER_SITE_OTHER): Payer: 59 | Admitting: Internal Medicine

## 2012-07-06 VITALS — BP 128/68 | HR 76 | Temp 98.3°F | Resp 18 | Wt 196.8 lb

## 2012-07-06 DIAGNOSIS — I10 Essential (primary) hypertension: Secondary | ICD-10-CM

## 2012-07-06 DIAGNOSIS — R42 Dizziness and giddiness: Secondary | ICD-10-CM

## 2012-07-07 ENCOUNTER — Telehealth: Payer: Self-pay | Admitting: *Deleted

## 2012-07-07 ENCOUNTER — Encounter: Payer: Self-pay | Admitting: Internal Medicine

## 2012-07-07 NOTE — Telephone Encounter (Signed)
Pt called back, states much improved from yesterday, no dizziness episodes since appt yesterday morning with Dr. Nicki Reaper. Pt had appt with Dr. Kathyrn Sheriff yesterday, scheduled for a vertigo test tomorrow morning to confirm diagnosis. States Dr. Kathyrn Sheriff will then discuss management options if it is vertigo.

## 2012-07-07 NOTE — Assessment & Plan Note (Signed)
Blood pressure doing well.

## 2012-07-07 NOTE — Telephone Encounter (Signed)
Left message on home and work number, for pt to call back, to followup from yesterday's visit and appt with Dr. Kathyrn Sheriff

## 2012-07-07 NOTE — Progress Notes (Signed)
Subjective:    Patient ID: Christina Stafford, female    DOB: January 25, 1971, 42 y.o.   MRN: [id number]  HPI 42 year old female with past history of sleep apnea, hypertension and hypercholesterolemia who comes in today as a work in with concerns regarding dizziness.  States when she woke this am, she got up out of bed and had to sit back down.  Almost "fell back".  Felt dizzy. Described it as the "room spinning".  Lasted approximately 15 seconds and resolved.  Went to the bathroom and felt ok.  Back in bed and was lying on her right side.  She flipped over to her left side and the room started spinning again.  Got out of bed to go to the bathroom and had to hold on to things to walk.  She continues to feel a little dizzy.  Walking slowly.  Did not drive.  Husband brought her.  Has never had issues with vertigo/inner ear previously.     Past Medical History  Diagnosis Date  . Sleep apnea 2011    using CPAP 6 cm H20  [facility] Sleep Study  . Carpal tunnel syndrome on both sides   . Depression   . Hyperlipidemia   . Hypertension   . Obesity     Outpatient Encounter Prescriptions as of 07/06/2012  Medication Sig Dispense Refill  . cetirizine (ZYRTEC) 10 MG tablet Take one by mouth daily when needed.       . hydrochlorothiazide (HYDRODIURIL) 25 MG tablet Take 0.5 tablets (12.5 mg total) by mouth daily. Take one half by mouth daily  30 tablet  5  . omeprazole (PRILOSEC) 20 MG capsule Take 20 mg by mouth daily.        . simvastatin (ZOCOR) 20 MG tablet Take 1 tablet (20 mg total) by mouth at bedtime.  90 tablet  3  . [DISCONTINUED] sulfamethoxazole-trimethoprim (BACTRIM DS,SEPTRA DS) 800-160 MG per tablet Take 1 tablet by mouth 2 (two) times daily. Take with food.  6 tablet  0   No facility-administered encounter medications on file as of 07/06/2012.    Review of Systems Patient denies any headache.  States that last week she had some pressure in her ears and headache and has been taking zyrtec.  No  headache now.  No increased sinus pressure or nasal congestion.  Dizziness as outlined.  Described as room spinning.  Movement aggravates.   No chest pain, tightness or palpitations.  No increased shortness of breath, cough or congestion.  No nausea or vomiting.  No bowel change, such as diarrhea.  States she feels (other than the dizziness) - at her baseline.  She denies possibility of being pregnant.  Has had BTL.         Objective:   Physical Exam Filed Vitals:   07/06/12 1036  BP: 128/68  Pulse: 76  Temp: 98.3 F (36.8 C)  Resp: 5   42 year old female in no acute distress.   HEENT:  Nares- clear.  Oropharynx - without lesions.  TMs visualized - without erythema or fullness.  No tenderness to palpation over the sinuses.    Reproducible symptoms with going from lying to sitting.    NECK:  Supple.  Nontender.  No audible bruit.  HEART:  Appears to be regular. LUNGS:  No crackles or wheezing audible.  Respirations even and unlabored.  RADIAL PULSE:  Equal bilaterally.       Assessment & Plan:   ACUTE DIZZINESS.  Symptoms and exam  as outlined.  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  Is unable to work or drive.  Ongoing symptoms.  Discussed use of meclizine.  Will also refer to ENT for evaluation/testing and possible Epley maneuvers.  Pt comfortable with this plan.  If any change or problems, she is to call or be reevaluated.

## 2012-11-03 ENCOUNTER — Ambulatory Visit: Payer: Self-pay | Admitting: Internal Medicine

## 2012-11-04 ENCOUNTER — Ambulatory Visit (INDEPENDENT_AMBULATORY_CARE_PROVIDER_SITE_OTHER): Payer: 59 | Admitting: Internal Medicine

## 2012-11-04 ENCOUNTER — Encounter: Payer: Self-pay | Admitting: Internal Medicine

## 2012-11-04 VITALS — BP 128/98 | HR 91 | Temp 99.1°F | Resp 16 | Wt 198.5 lb

## 2012-11-04 DIAGNOSIS — I10 Essential (primary) hypertension: Secondary | ICD-10-CM

## 2012-11-04 DIAGNOSIS — N926 Irregular menstruation, unspecified: Secondary | ICD-10-CM

## 2012-11-04 DIAGNOSIS — E669 Obesity, unspecified: Secondary | ICD-10-CM

## 2012-11-04 DIAGNOSIS — F410 Panic disorder [episodic paroxysmal anxiety] without agoraphobia: Secondary | ICD-10-CM | POA: Insufficient documentation

## 2012-11-04 DIAGNOSIS — R002 Palpitations: Secondary | ICD-10-CM

## 2012-11-04 MED ORDER — ALPRAZOLAM 0.5 MG PO TABS: 0.5000 mg | ORAL_TABLET | Freq: Two times a day (BID) | ORAL | Status: DC | PRN

## 2012-11-04 MED ORDER — ALPRAZOLAM 0.5 MG PO TBDP: 0.5000 mg | ORAL_TABLET | Freq: Two times a day (BID) | ORAL | Status: DC | PRN

## 2012-11-04 MED ORDER — PAROXETINE HCL ER 12.5 MG PO TB24
12.5000 mg | ORAL_TABLET | ORAL | Status: DC
Start: 2012-11-04 — End: 2012-11-30

## 2012-11-04 NOTE — Assessment & Plan Note (Addendum)
Trial of paxil,  Prn alprazolam and referral to therapist offered. Patient was given Mateo Flow Padgett's contact information. Regular exercise also frecommewdned as a nonpharmacologic way to manage anxiety

## 2012-11-04 NOTE — Progress Notes (Signed)
Patient ID: Christina Stafford, female   DOB: 1970/12/02, 42 y.o.   MRN: [id number]   Patient Active Problem List   Diagnosis Date Noted  . Palpitations 11/06/2012  . Panic disorder (episodic paroxysmal anxiety) 11/04/2012  . Menstrual irregularity 01/23/2012  . Tobacco abuse counseling 03/26/2011  . Screening for cervical cancer 03/26/2011  . Sleep apnea   . Carpal tunnel syndrome on both sides   . Depression   . Hyperlipidemia   . Hypertension   . Obesity     Subjective:  CC:   Chief Complaint  Patient presents with  . Acute Visit    patient broke down crying in exam room.  Marland Kitchen Anxiety    having an impending since of doom, tingling feeling in lips.    HPI:   Christina Stafford a 42 y.o. female who presents For recent onset of uncontrolled anxiety and panic attackss.  She was been waking up with episodes of heart racing while in bed . No hyperventilation  But then feels panicky  Her PMS is worse,  Very angry a lot,  Irritable,  . Home life ok .  Periods have been irregular for the last 6 to 8 months .   She is tearful in the office when describing her sympotms.  She wakes up in the morning dreading the workday due to increased pressures and workload at the office.   She has OSA and has been using her CPAP intermittently due to an ill fitting mask and notes that some nights wakes up without it.   Not sure if the events are happening on the nights she hasn't worn it.   Dizziness on and off for 3 weeks ,  Not vertigo ,  Head feels heavy at times, and worried she might pass out.  Happens during the day and at night, also while driving.     Past Medical History  Diagnosis Date  . Sleep apnea 2011    using CPAP 6 cm H20  [facility] Sleep Study  . Carpal tunnel syndrome on both sides   . Depression   . Hyperlipidemia   . Hypertension   . Obesity     Past Surgical History  Procedure Laterality Date  . Breast surgery  2003    negative biopsy  . Cholecystectomy  1995  .  Tonsillectomy and adenoidectomy  2003       The following portions of the patient's history were reviewed and updated as appropriate: Allergies, current medications, and problem list.    Review of Systems:   12 Pt  review of systems was negative except those addressed in the HPI,     History   Social History  . Marital Status: Married    Spouse Name: N/A    Number of Children: N/A  . Years of Education: N/A   Occupational History  . Not on file.   Social History Main Topics  . Smoking status: Former Smoker    Start date: 05/17/2011  . Smokeless tobacco: Not on file  . Alcohol Use: 1.8 oz/week    3 Glasses of wine per week  . Drug Use: Not on file  . Sexual Activity: Not on file   Other Topics Concern  . Not on file   Social History Narrative  . No narrative on file    Objective:  Filed Vitals:   11/04/12 1614  BP: 128/98  Pulse: 91  Temp: 99.1 F (37.3 C)  Resp: 16     General appearance: alert,  cooperative and appears stated age Ears: normal TM's and external ear canals both ears Throat: lips, mucosa, and tongue normal; teeth and gums normal Neck: no adenopathy, no carotid bruit, supple, symmetrical, trachea midline and thyroid not enlarged, symmetric, no tenderness/mass/nodules Back: symmetric, no curvature. ROM normal. No CVA tenderness. Lungs: clear to auscultation bilaterally Heart: regular rate and rhythm, S1, S2 normal, no murmur, click, rub or gallop Abdomen: soft, non-tender; bowel sounds normal; no masses,  no organomegaly Pulses: 2+ and symmetric Skin: Skin color, texture, turgor normal. No rashes or lesions Lymph nodes: Cervical, supraclavicular, and axillary nodes normal.  Assessment and Plan:  Panic disorder (episodic paroxysmal anxiety) Trial of paxil,  Prn alprazolam and referral to therapist offered. Patient was given Mateo Flow Padgett's contact information. Regular exercise also frecommewdned as a nonpharmacologic way to manage  anxiety  Menstrual irregularity likely PCOS given her obesity and metabolic syndrome.   Obesity I have addressed  BMI and recommended a low glycemic index diet utilizing smaller more frequent meals to increase metabolism.  I have also recommended that patient start exercising with a goal of 30 minutes of aerobic exercise a minimum of 5 days per week. Screening for lipid disorders, thyroid and diabetes to be done today.    Hypertension Well controlled on current regimen. Renal function stable, no changes today.  Palpitations Patient has normal cardiac exam.,  Episodes are not nightly and most likely occurring bc of untreated OSA and resulting hypoxemia vs ;anic attacks since they occur nocturnally.,  checking thyroid, lytes today and if they contineu with treatment of OSA and anxiety will refer for Holter monitor. Marland Kitchen    Updated Medication List Outpatient Encounter Prescriptions as of 11/04/2012  Medication Sig Dispense Refill  . cetirizine (ZYRTEC) 10 MG tablet Take one by mouth daily when needed.       Marland Kitchen esomeprazole (NEXIUM) 20 MG packet Take 20 mg by mouth daily before breakfast.      . hydrochlorothiazide (HYDRODIURIL) 25 MG tablet Take 0.5 tablets (12.5 mg total) by mouth daily. Take one half by mouth daily  30 tablet  5  . ALPRAZolam (XANAX) 0.5 MG tablet Take 1 tablet (0.5 mg total) by mouth 2 (two) times daily as needed for sleep or anxiety.  30 tablet  2  . PARoxetine (PAXIL-CR) 12.5 MG 24 hr tablet Take 1 tablet (12.5 mg total) by mouth every morning.  30 tablet  1  . simvastatin (ZOCOR) 20 MG tablet Take 1 tablet (20 mg total) by mouth at bedtime.  90 tablet  3  . [DISCONTINUED] ALPRAZolam (NIRAVAM) 0.5 MG dissolvable tablet Take 1 tablet (0.5 mg total) by mouth 2 (two) times daily as needed for anxiety.  30 tablet  2  . [DISCONTINUED] omeprazole (PRILOSEC) 20 MG capsule Take 20 mg by mouth daily.         No facility-administered encounter medications on file as of 11/04/2012.      Orders Placed This Encounter  Procedures  . TSH  . Comprehensive metabolic panel  . CBC with Differential  . Magnesium    No Follow-up on file.

## 2012-11-04 NOTE — Patient Instructions (Addendum)
I am starting you on Paxil CR  At 12, 5 mg daily for your anxiety   The alprazolam can be used as needed for panic attacks  I recommend the therapist Lavella Hammock if you think you would benefit from talk therapy  Her phone number is [phone] and she is in [place]   Return in 3 weeks

## 2012-11-05 LAB — CBC WITH DIFFERENTIAL/PLATELET
Basophils Relative: 0.7 % (ref 0.0–3.0)
Eosinophils Absolute: 0.2 10*3/uL (ref 0.0–0.7)
Eosinophils Relative: 1.8 % (ref 0.0–5.0)
Hemoglobin: 13.6 g/dL (ref 12.0–15.0)
Lymphocytes Relative: 44.4 % (ref 12.0–46.0)
MCHC: 34.1 g/dL (ref 30.0–36.0)
Monocytes Relative: 6.3 % (ref 3.0–12.0)
Neutro Abs: 4 10*3/uL (ref 1.4–7.7)
RBC: 4.46 Mil/uL (ref 3.87–5.11)
WBC: 8.6 10*3/uL (ref 4.5–10.5)

## 2012-11-05 LAB — COMPREHENSIVE METABOLIC PANEL
ALT: 20 U/L (ref 0–35)
BUN: 11 mg/dL (ref 6–23)
CO2: 27 mEq/L (ref 19–32)
Creatinine, Ser: 0.8 mg/dL (ref 0.4–1.2)
GFR: 87.77 mL/min (ref >60.00)
Total Bilirubin: 0.4 mg/dL (ref 0.3–1.2)

## 2012-11-05 LAB — TSH: TSH: 2.04 u[IU]/mL (ref 0.35–5.50)

## 2012-11-06 ENCOUNTER — Encounter: Payer: Self-pay | Admitting: Internal Medicine

## 2012-11-06 DIAGNOSIS — R002 Palpitations: Secondary | ICD-10-CM | POA: Insufficient documentation

## 2012-11-06 NOTE — Assessment & Plan Note (Signed)
Patient has normal cardiac exam.,  Episodes are not nightly and most likely occurring bc of untreated OSA and resulting hypoxemia vs ;anic attacks since they occur nocturnally.,  checking thyroid, lytes today and if they contineu with treatment of OSA and anxiety will refer for Holter monitor. Marland Kitchen

## 2012-11-06 NOTE — Assessment & Plan Note (Signed)
I have addressed  BMI and recommended a low glycemic index diet utilizing smaller more frequent meals to increase metabolism.  I have also recommended that patient start exercising with a goal of 30 minutes of aerobic exercise a minimum of 5 days per week. Screening for lipid disorders, thyroid and diabetes to be done today.   

## 2012-11-06 NOTE — Assessment & Plan Note (Signed)
Well controlled on current regimen. Renal function stable, no changes today. 

## 2012-11-06 NOTE — Assessment & Plan Note (Signed)
likely PCOS given her obesity and metabolic syndrome.

## 2012-11-27 ENCOUNTER — Encounter: Payer: Self-pay | Admitting: *Deleted

## 2012-11-30 ENCOUNTER — Ambulatory Visit (INDEPENDENT_AMBULATORY_CARE_PROVIDER_SITE_OTHER): Payer: 59 | Admitting: Internal Medicine

## 2012-11-30 ENCOUNTER — Encounter: Payer: Self-pay | Admitting: Internal Medicine

## 2012-11-30 VITALS — BP 120/84 | HR 69 | Temp 98.3°F | Resp 14 | Ht 63.0 in | Wt 199.0 lb

## 2012-11-30 DIAGNOSIS — E669 Obesity, unspecified: Secondary | ICD-10-CM

## 2012-11-30 DIAGNOSIS — F329 Major depressive disorder, single episode, unspecified: Secondary | ICD-10-CM

## 2012-11-30 DIAGNOSIS — F3289 Other specified depressive episodes: Secondary | ICD-10-CM

## 2012-11-30 DIAGNOSIS — K219 Gastro-esophageal reflux disease without esophagitis: Secondary | ICD-10-CM

## 2012-11-30 DIAGNOSIS — F410 Panic disorder [episodic paroxysmal anxiety] without agoraphobia: Secondary | ICD-10-CM

## 2012-11-30 DIAGNOSIS — F32A Depression, unspecified: Secondary | ICD-10-CM

## 2012-11-30 DIAGNOSIS — N926 Irregular menstruation, unspecified: Secondary | ICD-10-CM

## 2012-11-30 DIAGNOSIS — E785 Hyperlipidemia, unspecified: Secondary | ICD-10-CM

## 2012-11-30 MED ORDER — PAROXETINE HCL ER 12.5 MG PO TB24: 12.5000 mg | ORAL_TABLET | ORAL | Status: DC

## 2012-11-30 NOTE — Progress Notes (Signed)
Patient ID: Christina Stafford, female   DOB: March 27, 1970, 42 y.o.   MRN: [id number]    Patient Active Problem List   Diagnosis Date Noted  . Palpitations 11/06/2012  . Panic disorder (episodic paroxysmal anxiety) 11/04/2012  . Menstrual irregularity 01/23/2012  . Tobacco abuse counseling 03/26/2011  . Screening for cervical cancer 03/26/2011  . Sleep apnea   . Carpal tunnel syndrome on both sides   . Depression   . Hyperlipidemia   . Hypertension   . Obesity     Subjective:  CC:   Chief Complaint  Patient presents with  . Follow-up    anxiety issue's    HPI:   Christina Stafford a 42 y.o. female who presents Followup on recent escalation of anxiety and depression with frequent panic attacks. She was evaluated last month and was started on Paxil CR for daily use. She was also given a prescription for alprazolam to use for panic attacks. She was not using her CPAP for known sleep apnea the time and was advised strongly to resume using this. She states that she feels much better today given the changes that were made last month. She is using her CPAP every night and has noted improved energy level and concentration. Her for panic attacks have been minimal and infrequent. She is using the alprazolam very rarely but keeps it with her for reassurance.  Gastritis. She continues to have dyspepsia and increased flatulence with occasional loose stools. She thought that her atorvastatin may been the cause so she suspend it for several weeks but has noted no difference. She switched her omeprazole to Nexium but developed recurrent dizziness which has resolved since she went back to omeprazole. She has made no dietary changes;  specifically she has not reduced her meal size or given up those foods that are known to cause reflux.   Past Medical History  Diagnosis Date  . Sleep apnea 2011    using CPAP 6 cm H20  [facility] Sleep Study  . Carpal tunnel syndrome on both sides   . Depression    . Hyperlipidemia   . Hypertension   . Obesity     Past Surgical History  Procedure Laterality Date  . Breast surgery  2003    negative biopsy  . Cholecystectomy  1995  . Tonsillectomy and adenoidectomy  2003       The following portions of the patient's history were reviewed and updated as appropriate: Allergies, current medications, and problem list.    Review of Systems:   12 Pt  review of systems was negative except those addressed in the HPI,     History   Social History  . Marital Status: Married    Spouse Name: N/A    Number of Children: N/A  . Years of Education: N/A   Occupational History  . Not on file.   Social History Main Topics  . Smoking status: Former Smoker    Start date: 05/17/2011  . Smokeless tobacco: Not on file  . Alcohol Use: 1.8 oz/week    3 Glasses of wine per week  . Drug Use: Not on file  . Sexual Activity: Not on file   Other Topics Concern  . Not on file   Social History Narrative  . No narrative on file    Objective:  Filed Vitals:   11/30/12 1612  BP: 120/84  Pulse: 69  Temp: 98.3 F (36.8 C)  Resp: 14     General appearance: alert, cooperative and appears  stated age Ears: normal TM's and external ear canals both ears Throat: lips, mucosa, and tongue normal; teeth and gums normal Neck: no adenopathy, no carotid bruit, supple, symmetrical, trachea midline and thyroid not enlarged, symmetric, no tenderness/mass/nodules Back: symmetric, no curvature. ROM normal. No CVA tenderness. Lungs: clear to auscultation bilaterally Heart: regular rate and rhythm, S1, S2 normal, no murmur, click, rub or gallop Abdomen: soft, non-tender; bowel sounds normal; no masses,  no organomegaly Pulses: 2+ and symmetric Skin: Skin color, texture, turgor normal. No rashes or lesions Lymph nodes: Cervical, supraclavicular, and axillary nodes normal.  Assessment and Plan:  Depression Improved with treatment of sleep apnea and  initiation of Paxil. No changes today. Recommended that she remain on Paxil for the holidays. We will discuss tapering her off of it in January.  Menstrual irregularity Likely secondary to polycystic ovarian syndrome given her metabolic state. Recommended weight loss and regular exercise.  Panic disorder (episodic paroxysmal anxiety) Her panic attacks were also occurring nocturnally likely a result of hypoxia from noncompliance with CPAP. Since she has resumed using CPAP she hasn't not had a single episode at night. She has alprazolam for when necessary use and has been using it very sparingly.  Hyperlipidemia Recommended that she resume the atorvastatin since her symptoms have not changed with suspension of statin and she has dyslipidemia obesity and tobacco abuse.  GERD (gastroesophageal reflux disease) Recommended   Updated Medication List Outpatient Encounter Prescriptions as of 11/30/2012  Medication Sig Dispense Refill  . cetirizine (ZYRTEC) 10 MG tablet Take one by mouth daily when needed.       . hydrochlorothiazide (HYDRODIURIL) 25 MG tablet Take 0.5 tablets (12.5 mg total) by mouth daily. Take one half by mouth daily  30 tablet  5  . omeprazole (PRILOSEC OTC) 20 MG tablet Take 20 mg by mouth daily.      Marland Kitchen PARoxetine (PAXIL-CR) 12.5 MG 24 hr tablet Take 1 tablet (12.5 mg total) by mouth every morning.  30 tablet  4  . [DISCONTINUED] PARoxetine (PAXIL-CR) 12.5 MG 24 hr tablet Take 1 tablet (12.5 mg total) by mouth every morning.  30 tablet  1  . ALPRAZolam (XANAX) 0.5 MG tablet Take 1 tablet (0.5 mg total) by mouth 2 (two) times daily as needed for sleep or anxiety.  30 tablet  2  . simvastatin (ZOCOR) 20 MG tablet Take 1 tablet (20 mg total) by mouth at bedtime.  90 tablet  3  . [DISCONTINUED] esomeprazole (NEXIUM) 20 MG packet Take 20 mg by mouth daily before breakfast.       No facility-administered encounter medications on file as of 11/30/2012.

## 2012-11-30 NOTE — Patient Instructions (Addendum)
For reflux:  Continue omeprazole in the AM 30 minutes ahead of food  Try adding 20 mg pepcid (famotidine) or zantac (ranitidine) 150 mg in the evening or a swig of Mylanta Gas   Avoid large meals,  chococolate and mint  NO lying down within 2 hrs of eating

## 2012-12-01 DIAGNOSIS — K219 Gastro-esophageal reflux disease without esophagitis: Secondary | ICD-10-CM | POA: Insufficient documentation

## 2012-12-01 NOTE — Assessment & Plan Note (Signed)
Her panic attacks were also occurring nocturnally likely a result of hypoxia from noncompliance with CPAP. Since she has resumed using CPAP she hasn't not had a single episode at night. She has alprazolam for when necessary use and has been using it very sparingly.

## 2012-12-01 NOTE — Assessment & Plan Note (Signed)
Likely secondary to polycystic ovarian syndrome given her metabolic state. Recommended weight loss and regular exercise.

## 2012-12-01 NOTE — Assessment & Plan Note (Signed)
Improved with treatment of sleep apnea and initiation of Paxil. No changes today. Recommended that she remain on Paxil for the holidays. We will discuss tapering her off of it in January.

## 2012-12-01 NOTE — Assessment & Plan Note (Addendum)
Recommended that she resume the atorvastatin since her symptoms have not changed with suspension of statin and she has dyslipidemia obesity and tobacco abuse.

## 2012-12-01 NOTE — Assessment & Plan Note (Signed)
Recommended

## 2013-01-21 ENCOUNTER — Other Ambulatory Visit: Payer: Self-pay

## 2013-02-04 ENCOUNTER — Encounter: Payer: Self-pay | Admitting: Adult Health

## 2013-02-04 ENCOUNTER — Ambulatory Visit (INDEPENDENT_AMBULATORY_CARE_PROVIDER_SITE_OTHER): Payer: 59 | Admitting: Adult Health

## 2013-02-04 VITALS — BP 126/82 | HR 74 | Temp 98.5°F | Resp 12 | Ht 63.0 in | Wt 204.0 lb

## 2013-02-04 DIAGNOSIS — J329 Chronic sinusitis, unspecified: Secondary | ICD-10-CM

## 2013-02-04 MED ORDER — AMOXICILLIN-POT CLAVULANATE 875-125 MG PO TABS: 1.0000 | ORAL_TABLET | Freq: Two times a day (BID) | ORAL | Status: DC

## 2013-02-04 MED ORDER — FLUTICASONE PROPIONATE 50 MCG/ACT NA SUSP: 2.0000 | Freq: Every day | NASAL | Status: DC

## 2013-02-04 NOTE — Progress Notes (Signed)
Pre visit review using our clinic review tool, if applicable. No additional management support is needed unless otherwise documented below in the visit note. 

## 2013-02-04 NOTE — Progress Notes (Signed)
  Subjective:    Patient ID: Christina Stafford, female    DOB: 16-Apr-1970, 42 y.o.   MRN: [id number]  HPI  Pt is a pleasant 42 y/o female who presents with sinus pressure, ear fullness, cough, green sputum, nasal drainage. Symptoms started on Monday. She started taking ibuprofen but now is taking DayQuil. No fever, however, she has been taking antipyretics.    Current Outpatient Prescriptions on File Prior to Visit  Medication Sig Dispense Refill  . ALPRAZolam (XANAX) 0.5 MG tablet Take 1 tablet (0.5 mg total) by mouth 2 (two) times daily as needed for sleep or anxiety.  30 tablet  2  . cetirizine (ZYRTEC) 10 MG tablet Take one by mouth daily when needed.       . hydrochlorothiazide (HYDRODIURIL) 25 MG tablet Take 0.5 tablets (12.5 mg total) by mouth daily. Take one half by mouth daily  30 tablet  5  . omeprazole (PRILOSEC OTC) 20 MG tablet Take 20 mg by mouth daily.      Marland Kitchen PARoxetine (PAXIL-CR) 12.5 MG 24 hr tablet Take 1 tablet (12.5 mg total) by mouth every morning.  30 tablet  4   No current facility-administered medications on file prior to visit.     Review of Systems  Constitutional: Negative for fever and chills.  HENT: Positive for congestion, postnasal drip, rhinorrhea, sinus pressure, sore throat and voice change.        Ear pressure  Respiratory: Positive for cough and wheezing.        Objective:   Physical Exam  Constitutional: She is oriented to person, place, and time. No distress.  HENT:  Head: Normocephalic and atraumatic.  Right Ear: External ear normal.  Left Ear: External ear normal.  Pharyngeal erythema  Cardiovascular: Normal rate and regular rhythm.   Pulmonary/Chest: Effort normal and breath sounds normal. No respiratory distress. She has no wheezes. She has no rales.  Lymphadenopathy:    She has cervical adenopathy.  Neurological: She is alert and oriented to person, place, and time.  Skin: Skin is warm and dry.  Psychiatric: She has a normal mood  and affect. Her behavior is normal. Judgment and thought content normal.          Assessment & Plan:

## 2013-02-04 NOTE — Patient Instructions (Signed)
Start Augmentin twice a day for 10 days. Flonase nasal spray 2 sprays in each nostril daily  Sinusitis Sinusitis is redness, soreness, and swelling (inflammation) of the paranasal sinuses. Paranasal sinuses are air pockets within the bones of your face (beneath the eyes, the middle of the forehead, or above the eyes). In healthy paranasal sinuses, mucus is able to drain out, and air is able to circulate through them by way of your nose. However, when your paranasal sinuses are inflamed, mucus and air can become trapped. This can allow bacteria and other germs to grow and cause infection. Sinusitis can develop quickly and last only a short time (acute) or continue over a long period (chronic). Sinusitis that lasts for more than 12 weeks is considered chronic.  CAUSES  Causes of sinusitis include:  Allergies.  Structural abnormalities, such as displacement of the cartilage that separates your nostrils (deviated septum), which can decrease the air flow through your nose and sinuses and affect sinus drainage.  Functional abnormalities, such as when the small hairs (cilia) that line your sinuses and help remove mucus do not work properly or are not present. SYMPTOMS  Symptoms of acute and chronic sinusitis are the same. The primary symptoms are pain and pressure around the affected sinuses. Other symptoms include:  Upper toothache.  Earache.  Headache.  Bad breath.  Decreased sense of smell and taste.  A cough, which worsens when you are lying flat.  Fatigue.  Fever.  Thick drainage from your nose, which often is green and may contain pus (purulent).  Swelling and warmth over the affected sinuses. DIAGNOSIS  Your caregiver will perform a physical exam. During the exam, your caregiver may:  Look in your nose for signs of abnormal growths in your nostrils (nasal polyps).  Tap over the affected sinus to check for signs of infection.  View the inside of your sinuses (endoscopy) with  a special imaging device with a light attached (endoscope), which is inserted into your sinuses. If your caregiver suspects that you have chronic sinusitis, one or more of the following tests may be recommended:  Allergy tests.  Nasal culture A sample of mucus is taken from your nose and sent to a lab and screened for bacteria.  Nasal cytology A sample of mucus is taken from your nose and examined by your caregiver to determine if your sinusitis is related to an allergy. TREATMENT  Most cases of acute sinusitis are related to a viral infection and will resolve on their own within 10 days. Sometimes medicines are prescribed to help relieve symptoms (pain medicine, decongestants, nasal steroid sprays, or saline sprays).  However, for sinusitis related to a bacterial infection, your caregiver will prescribe antibiotic medicines. These are medicines that will help kill the bacteria causing the infection.  Rarely, sinusitis is caused by a fungal infection. In theses cases, your caregiver will prescribe antifungal medicine. For some cases of chronic sinusitis, surgery is needed. Generally, these are cases in which sinusitis recurs more than 3 times per year, despite other treatments. HOME CARE INSTRUCTIONS   Drink plenty of water. Water helps thin the mucus so your sinuses can drain more easily.  Use a humidifier.  Inhale steam 3 to 4 times a day (for example, sit in the bathroom with the shower running).  Apply a warm, moist washcloth to your face 3 to 4 times a day, or as directed by your caregiver.  Use saline nasal sprays to help moisten and clean your sinuses.  Take  over-the-counter or prescription medicines for pain, discomfort, or fever only as directed by your caregiver. SEEK IMMEDIATE MEDICAL CARE IF:  You have increasing pain or severe headaches.  You have nausea, vomiting, or drowsiness.  You have swelling around your face.  You have vision problems.  You have a stiff  neck.  You have difficulty breathing. MAKE SURE YOU:   Understand these instructions.  Will watch your condition.  Will get help right away if you are not doing well or get worse. Document Released: 03/04/2005 Document Revised: 05/27/2011 Document Reviewed: 03/19/2011 [facility] Patient Information 2014 [place], [place].

## 2013-02-04 NOTE — Assessment & Plan Note (Signed)
Start Augmentin twice a day for 10 days. Flonase nasal spray 2 sprays in each nostril daily

## 2013-05-23 ENCOUNTER — Other Ambulatory Visit: Payer: Self-pay | Admitting: Internal Medicine

## 2013-06-17 ENCOUNTER — Other Ambulatory Visit: Payer: Self-pay | Admitting: Internal Medicine

## 2013-06-25 ENCOUNTER — Ambulatory Visit: Payer: Self-pay | Admitting: Internal Medicine

## 2013-07-02 ENCOUNTER — Ambulatory Visit: Payer: 59 | Admitting: Internal Medicine

## 2013-07-05 ENCOUNTER — Encounter: Payer: Self-pay | Admitting: Internal Medicine

## 2013-07-05 ENCOUNTER — Ambulatory Visit (INDEPENDENT_AMBULATORY_CARE_PROVIDER_SITE_OTHER): Payer: 59 | Admitting: Internal Medicine

## 2013-07-05 VITALS — BP 108/76 | HR 74 | Temp 98.8°F | Resp 16 | Wt 200.5 lb

## 2013-07-05 DIAGNOSIS — E669 Obesity, unspecified: Secondary | ICD-10-CM

## 2013-07-05 DIAGNOSIS — Z9989 Dependence on other enabling machines and devices: Secondary | ICD-10-CM

## 2013-07-05 DIAGNOSIS — I10 Essential (primary) hypertension: Secondary | ICD-10-CM

## 2013-07-05 DIAGNOSIS — Z79899 Other long term (current) drug therapy: Secondary | ICD-10-CM

## 2013-07-05 DIAGNOSIS — G4733 Obstructive sleep apnea (adult) (pediatric): Secondary | ICD-10-CM

## 2013-07-05 DIAGNOSIS — E785 Hyperlipidemia, unspecified: Secondary | ICD-10-CM

## 2013-07-05 DIAGNOSIS — N943 Premenstrual tension syndrome: Secondary | ICD-10-CM

## 2013-07-05 MED ORDER — BUSPIRONE HCL 5 MG PO TABS: 5.0000 mg | ORAL_TABLET | Freq: Three times a day (TID) | ORAL | Status: DC

## 2013-07-05 MED ORDER — PAROXETINE HCL ER 12.5 MG PO TB24: ORAL_TABLET | ORAL | Status: DC

## 2013-07-05 NOTE — Patient Instructions (Signed)
Start the buspirone  10 days before your period ,  Can take up to 3 times daily   After the period starts  Decrease gradually over 3 days  Continue the paxil Cr for now and e mail me when you are ready to dc it.   Return in 5 weeks for fasting labs  [facility] lab appt only)

## 2013-07-05 NOTE — Progress Notes (Signed)
Patient ID: Christina Stafford, female   DOB: 1970/09/03, 43 y.o.   MRN: [id number]  Patient Active Problem List   Diagnosis Date Noted  . PMS (premenstrual syndrome) 07/06/2013  . Sinusitis 02/04/2013  . GERD (gastroesophageal reflux disease) 12/01/2012  . Palpitations 11/06/2012  . Panic disorder (episodic paroxysmal anxiety) 11/04/2012  . Menstrual irregularity 01/23/2012  . Tobacco abuse counseling 03/26/2011  . Screening for cervical cancer 03/26/2011  . OSA on CPAP   . Carpal tunnel syndrome on both sides   . Depression   . Hyperlipidemia   . Hypertension   . Obesity     Subjective:  CC:   Chief Complaint  Patient presents with  . Follow-up    medication    HPI:   Christina Stafford is a 43 y.o. female who presents for   6 month follow up on panic disorder managed with paxil .  Feels she is having fewr attacks since resuming paxil but has some mood swings which she describes as PMS before her period. becomes very inpatient with coworkers,  And has had discussion with supervisor .      Obesity:  Participating in a 6 week GET FIT challenge, has lost 8 lbs in 4 weeks,   mela plans exercises.    Past Medical History  Diagnosis Date  . Sleep apnea 2011    using CPAP 6 cm H20  [facility] Sleep Study  . Carpal tunnel syndrome on both sides   . Depression   . Hyperlipidemia   . Hypertension   . Obesity     Past Surgical History  Procedure Laterality Date  . Breast surgery  2003    negative biopsy  . Cholecystectomy  1995  . Tonsillectomy and adenoidectomy  2003       The following portions of the patient's history were reviewed and updated as appropriate: Allergies, current medications, and problem list.    Review of Systems:   Patient denies headache, fevers, malaise, unintentional weight loss, skin rash, eye pain, sinus congestion and sinus pain, sore throat, dysphagia,  hemoptysis , cough, dyspnea, wheezing, chest pain, palpitations, orthopnea, edema,  abdominal pain, nausea, melena, diarrhea, constipation, flank pain, dysuria, hematuria, urinary  Frequency, nocturia, numbness, tingling, seizures,  Focal weakness, Loss of consciousness,  Tremor, insomnia, depression, anxiety, and suicidal ideation.     History   Social History  . Marital Status: Married    Spouse Name: N/A    Number of Children: N/A  . Years of Education: N/A   Occupational History  . Not on file.   Social History Main Topics  . Smoking status: Former Smoker    Start date: 05/17/2011  . Smokeless tobacco: Not on file  . Alcohol Use: 1.8 oz/week    3 Glasses of wine per week  . Drug Use: Not on file  . Sexual Activity: Not on file   Other Topics Concern  . Not on file   Social History Narrative  . No narrative on file    Objective:  Filed Vitals:   07/05/13 1726  BP: 108/76  Pulse: 74  Temp: 98.8 F (37.1 C)  Resp: 16     General appearance: alert, cooperative and appears stated age Ears: normal TM's and external ear canals both ears Throat: lips, mucosa, and tongue normal; teeth and gums normal Neck: no adenopathy, no carotid bruit, supple, symmetrical, trachea midline and thyroid not enlarged, symmetric, no tenderness/mass/nodules Back: symmetric, no curvature. ROM normal. No CVA tenderness. Lungs: clear to auscultation  bilaterally Heart: regular rate and rhythm, S1, S2 normal, no murmur, click, rub or gallop Abdomen: soft, non-tender; bowel sounds normal; no masses,  no organomegaly Pulses: 2+ and symmetric Skin: Skin color, texture, turgor normal. No rashes or lesions Lymph nodes: Cervical, supraclavicular, and axillary nodes normal.  Assessment and Plan:  PMS (premenstrual syndrome) Adding buspirone for PMs sympotnms of increased anxiety and irritability starting 10 days prior to menses   Obesity Improving with participation in  a comprehensive program.    Hypertension Well controlled on current regimen. Renal function stable, no  changes today.  Lab Results  Component Value Date   CREATININE 0.8 11/04/2012     OSA on CPAP Diagnosed by sleep study. She is wearing her CPAP every night a minimum of 6 hours per night.    Updated Medication List Outpatient Encounter Prescriptions as of 07/05/2013  Medication Sig  . ALPRAZolam (XANAX) 0.5 MG tablet Take 1 tablet (0.5 mg total) by mouth 2 (two) times daily as needed for sleep or anxiety.  Marland Kitchen amoxicillin-clavulanate (AUGMENTIN) 875-125 MG per tablet Take 1 tablet by mouth 2 (two) times daily.  . cetirizine (ZYRTEC) 10 MG tablet Take one by mouth daily when needed.   . fluticasone (FLONASE) 50 MCG/ACT nasal spray Place 2 sprays into both nostrils daily.  . hydrochlorothiazide (HYDRODIURIL) 25 MG tablet TAKE 1/2 TABLET BY MOUTH EVERY DAY  . omeprazole (PRILOSEC OTC) 20 MG tablet Take 20 mg by mouth daily.  Marland Kitchen PARoxetine (PAXIL-CR) 12.5 MG 24 hr tablet TAKE 1 TABLET BY MOUTH EVERY MORNING  . [DISCONTINUED] PARoxetine (PAXIL-CR) 12.5 MG 24 hr tablet TAKE 1 TABLET BY MOUTH EVERY MORNING  . busPIRone (BUSPAR) 5 MG tablet Take 1 tablet (5 mg total) by mouth 3 (three) times daily.     Orders Placed This Encounter  Procedures  . Comprehensive metabolic panel  . Lipid panel    No Follow-up on file.

## 2013-07-05 NOTE — Progress Notes (Signed)
Pre-visit discussion using our clinic review tool. No additional management support is needed unless otherwise documented below in the visit note.  

## 2013-07-06 DIAGNOSIS — N943 Premenstrual tension syndrome: Secondary | ICD-10-CM | POA: Insufficient documentation

## 2013-07-06 NOTE — Assessment & Plan Note (Signed)
Diagnosed by sleep study. She is wearing her CPAP every night a minimum of 6 hours per night.

## 2013-07-06 NOTE — Assessment & Plan Note (Signed)
Improving with participation in  a comprehensive program.

## 2013-07-06 NOTE — Assessment & Plan Note (Signed)
Well controlled on current regimen. Renal function stable, no changes today.  Lab Results  Component Value Date   CREATININE 0.8 11/04/2012

## 2013-07-06 NOTE — Assessment & Plan Note (Signed)
Adding buspirone for PMs sympotnms of increased anxiety and irritability starting 10 days prior to menses

## 2013-08-01 ENCOUNTER — Other Ambulatory Visit: Payer: Self-pay | Admitting: Internal Medicine

## 2013-10-12 ENCOUNTER — Telehealth: Payer: Self-pay | Admitting: *Deleted

## 2013-10-12 NOTE — Telephone Encounter (Signed)
Pt called states she is ready to wean off Paxil.  Please advise

## 2013-10-12 NOTE — Telephone Encounter (Signed)
Paxil wean:  I tablet every other day for 4 tablets,  then every 3rd day for 4 tablets, then every 4th day for 4 tablets,  Then stop

## 2013-10-13 NOTE — Telephone Encounter (Signed)
Left detailed message on pts identified VM

## 2013-10-15 ENCOUNTER — Other Ambulatory Visit (INDEPENDENT_AMBULATORY_CARE_PROVIDER_SITE_OTHER): Payer: 59

## 2013-10-15 DIAGNOSIS — Z79899 Other long term (current) drug therapy: Secondary | ICD-10-CM

## 2013-10-15 DIAGNOSIS — E785 Hyperlipidemia, unspecified: Secondary | ICD-10-CM

## 2013-10-15 LAB — COMPREHENSIVE METABOLIC PANEL
ALT: 28 U/L (ref 0–35)
AST: 25 U/L (ref 0–37)
Albumin: 3.8 g/dL (ref 3.5–5.2)
Alkaline Phosphatase: 67 U/L (ref 39–117)
BILIRUBIN TOTAL: 0.6 mg/dL (ref 0.2–1.2)
BUN: 17 mg/dL (ref 6–23)
CO2: 28 meq/L (ref 19–32)
CREATININE: 0.8 mg/dL (ref 0.4–1.2)
Calcium: 9.2 mg/dL (ref 8.4–10.5)
Chloride: 104 mEq/L (ref 96–112)
GFR: 81.2 mL/min (ref >60.00)
GLUCOSE: 98 mg/dL (ref 70–99)
Potassium: 3.8 mEq/L (ref 3.5–5.1)
SODIUM: 138 meq/L (ref 135–145)
Total Protein: 7.2 g/dL (ref 6.0–8.3)

## 2013-10-15 LAB — LIPID PANEL
CHOL/HDL RATIO: 6
CHOLESTEROL: 246 mg/dL — AB (ref 0–200)
HDL: 41.1 mg/dL (ref >39.00)
LDL CALC: 180 mg/dL — AB (ref 0–99)
NonHDL: 204.9
Triglycerides: 125 mg/dL (ref 0.0–149.0)
VLDL: 25 mg/dL (ref 0.0–40.0)

## 2013-10-17 ENCOUNTER — Encounter: Payer: Self-pay | Admitting: Internal Medicine

## 2013-11-03 ENCOUNTER — Encounter: Payer: Self-pay | Admitting: Internal Medicine

## 2014-03-07 ENCOUNTER — Ambulatory Visit (INDEPENDENT_AMBULATORY_CARE_PROVIDER_SITE_OTHER): Payer: 59 | Admitting: Internal Medicine

## 2014-03-07 ENCOUNTER — Ambulatory Visit (INDEPENDENT_AMBULATORY_CARE_PROVIDER_SITE_OTHER): Payer: 59 | Admitting: *Deleted

## 2014-03-07 ENCOUNTER — Encounter: Payer: Self-pay | Admitting: Internal Medicine

## 2014-03-07 ENCOUNTER — Other Ambulatory Visit: Payer: Self-pay | Admitting: Internal Medicine

## 2014-03-07 VITALS — BP 112/78 | HR 67 | Temp 98.3°F | Ht 63.0 in | Wt 187.0 lb

## 2014-03-07 DIAGNOSIS — Z23 Encounter for immunization: Secondary | ICD-10-CM

## 2014-03-07 DIAGNOSIS — I10 Essential (primary) hypertension: Secondary | ICD-10-CM

## 2014-03-07 DIAGNOSIS — E785 Hyperlipidemia, unspecified: Secondary | ICD-10-CM

## 2014-03-07 DIAGNOSIS — E669 Obesity, unspecified: Secondary | ICD-10-CM

## 2014-03-07 DIAGNOSIS — N926 Irregular menstruation, unspecified: Secondary | ICD-10-CM

## 2014-03-07 NOTE — Progress Notes (Signed)
Patient ID: Christina Stafford, female   DOB: 07/22/1970, 43 y.o.   MRN: [id number]   Patient Active Problem List   Diagnosis Date Noted  . Irregular menstrual bleeding 03/07/2014  . PMS (premenstrual syndrome) 07/06/2013  . GERD (gastroesophageal reflux disease) 12/01/2012  . Palpitations 11/06/2012  . Panic disorder (episodic paroxysmal anxiety) 11/04/2012  . Menstrual irregularity 01/23/2012  . Tobacco abuse counseling 03/26/2011  . Screening for cervical cancer 03/26/2011  . OSA on CPAP   . Carpal tunnel syndrome on both sides   . Depression   . Hyperlipidemia   . Hypertension   . Obesity     Subjective:  CC:   Chief Complaint  Patient presents with  . Follow-up    cholesterol and blood pressure     HPI:   Christina Stafford is a 43 y.o. female who presents for 6 month follow up on hypertension, obesity and GAD. She weaned herself off of  paxil  About 2 months ago. She feels calm ,  Does not thinks she needs to resume any daily pharmacotherapy at this time.  However her symptoms are aggravated by premenstural syndrome and has been using the buspirone  prn when premenstrual   Having some night sweats ,  A couple of hot flashes, no palpitations,  tremors or headaches.   Periods have become irregular and are occurring every 2 weeks since Nov 4th .   Wants  to have her thyoid checked and rule out menopause.    Past Medical History  Diagnosis Date  . Sleep apnea 2011    using CPAP 6 cm H20  [facility] Sleep Study  . Carpal tunnel syndrome on both sides   . Depression   . Hyperlipidemia   . Hypertension   . Obesity     Past Surgical History  Procedure Laterality Date  . Breast surgery  2003    negative biopsy  . Cholecystectomy  1995  . Tonsillectomy and adenoidectomy  2003       The following portions of the patient's history were reviewed and updated as appropriate: Allergies, current medications, and problem list.    Review of Systems:   Patient  denies headache, fevers, malaise, unintentional weight loss, skin rash, eye pain, sinus congestion and sinus pain, sore throat, dysphagia,  hemoptysis , cough, dyspnea, wheezing, chest pain, palpitations, orthopnea, edema, abdominal pain, nausea, melena, diarrhea, constipation, flank pain, dysuria, hematuria, urinary  Frequency, nocturia, numbness, tingling, seizures,  Focal weakness, Loss of consciousness,  Tremor, insomnia, depression, anxiety, and suicidal ideation.     History   Social History  . Marital Status: Married    Spouse Name: N/A    Number of Children: N/A  . Years of Education: N/A   Occupational History  . Not on file.   Social History Main Topics  . Smoking status: Former Smoker    Start date: 05/17/2011  . Smokeless tobacco: Not on file  . Alcohol Use: 1.8 oz/week    3 Glasses of wine per week  . Drug Use: Not on file  . Sexual Activity: Not on file   Other Topics Concern  . Not on file   Social History Narrative    Objective:  Filed Vitals:   03/07/14 1728  BP: 112/78  Pulse: 67  Temp: 98.3 F (36.8 C)     General appearance: alert, cooperative and appears stated age Ears: normal TM's and external ear canals both ears Throat: lips, mucosa, and tongue normal; teeth and gums normal  Neck: no adenopathy, no carotid bruit, supple, symmetrical, trachea midline and thyroid not enlarged, symmetric, no tenderness/mass/nodules Back: symmetric, no curvature. ROM normal. No CVA tenderness. Lungs: clear to auscultation bilaterally Heart: regular rate and rhythm, S1, S2 normal, no murmur, click, rub or gallop Abdomen: soft, non-tender; bowel sounds normal; no masses,  no organomegaly Pulses: 2+ and symmetric Skin: Skin color, texture, turgor normal. No rashes or lesions Lymph nodes: Cervical, supraclavicular, and axillary nodes normal.  Assessment and Plan:  Problem List Items Addressed This Visit    Hyperlipidemia   Relevant Orders      Lipid panel    Hypertension    Current normotensive without medications since she lost weight. Renal function was normal in late July   Lab Results  Component Value Date   CREATININE 0.8 10/15/2013   Lab Results  Component Value Date   NA 138 10/15/2013   K 3.8 10/15/2013   CL 104 10/15/2013   CO2 28 10/15/2013       Relevant Orders      Comprehensive metabolic panel   Irregular menstrual bleeding - Primary    Recurrent , with last year's occurrence considered secondary to PCOS.  checking TSH, FSH, and LH.  If all are normal,  Consider PCOS    Relevant Orders      TSH      Follicle stimulating hormone      Luteinizing hormone      CBC with Differential          Obesity    I have congratulated her in reduction of   BMI and encouraged  Continued weight loss with goal of 10% of body weigh over the next 6 months using a low glycemic index diet and regular exercise a minimum of 5 days per week.

## 2014-03-07 NOTE — Patient Instructions (Signed)
Congratulations on the weight loss!  You may have resolved your hypertension  Stop the hctz and return in 2 weeks for a RN visit for BP check AND fasting labs (including hormone levels)  Perimenopause Perimenopause is the time when your body begins to move into the menopause (no menstrual period for 12 straight months). It is a natural process. Perimenopause can begin 2-8 years before the menopause and usually lasts for 1 year after the menopause. During this time, your ovaries may or may not produce an egg. The ovaries vary in their production of estrogen and progesterone hormones each month. This can cause irregular menstrual periods, difficulty getting pregnant, vaginal bleeding between periods, and uncomfortable symptoms. CAUSES  Irregular production of the ovarian hormones, estrogen and progesterone, and not ovulating every month.  Other causes include:  Tumor of the pituitary gland in the brain.  Medical disease that affects the ovaries.  Radiation treatment.  Chemotherapy.  Unknown causes.  Heavy smoking and excessive alcohol intake can bring on perimenopause sooner. SIGNS AND SYMPTOMS   Hot flashes.  Night sweats.  Irregular menstrual periods.  Decreased sex drive.  Vaginal dryness.  Headaches.  Mood swings.  Depression.  Memory problems.  Irritability.  Tiredness.  Weight gain.  Trouble getting pregnant.  The beginning of losing bone cells (osteoporosis).  The beginning of hardening of the arteries (atherosclerosis). DIAGNOSIS  Your health care provider will make a diagnosis by analyzing your age, menstrual history, and symptoms. He or she will do a physical exam and note any changes in your body, especially your female organs. Female hormone tests may or may not be helpful depending on the amount of female hormones you produce and when you produce them. However, other hormone tests may be helpful to rule out other problems. TREATMENT  In some cases,  no treatment is needed. The decision on whether treatment is necessary during the perimenopause should be made by you and your health care provider based on how the symptoms are affecting you and your lifestyle. Various treatments are available, such as:  Treating individual symptoms with a specific medicine for that symptom.  Herbal medicines that can help specific symptoms.  Counseling.  Group therapy. HOME CARE INSTRUCTIONS   Keep track of your menstrual periods (when they occur, how heavy they are, how long between periods, and how long they last) as well as your symptoms and when they started.  Only take over-the-counter or prescription medicines as directed by your health care provider.  Sleep and rest.  Exercise.  Eat a diet that contains calcium (good for your bones) and soy (acts like the estrogen hormone).  Do not smoke.  Avoid alcoholic beverages.  Take vitamin supplements as recommended by your health care provider. Taking vitamin E may help in certain cases.  Take calcium and vitamin D supplements to help prevent bone loss.  Group therapy is sometimes helpful.  Acupuncture may help in some cases. SEEK MEDICAL CARE IF:   You have questions about any symptoms you are having.  You need a referral to a specialist (gynecologist, psychiatrist, or psychologist). SEEK IMMEDIATE MEDICAL CARE IF:   You have vaginal bleeding.  Your period lasts longer than 8 days.  Your periods are recurring sooner than 21 days.  You have bleeding after intercourse.  You have severe depression.  You have pain when you urinate.  You have severe headaches.  You have vision problems. Document Released: 04/11/2004 Document Revised: 12/23/2012 Document Reviewed: 10/01/2012 [employer] Patient Information 2015 [place] [employer]. This information is not intended to replace advice given to you by your health care provider. Make sure you discuss any questions you have with your health care  provider.

## 2014-03-07 NOTE — Progress Notes (Signed)
Pre visit review using our clinic review tool, if applicable. No additional management support is needed unless otherwise documented below in the visit note. 

## 2014-03-10 ENCOUNTER — Encounter: Payer: Self-pay | Admitting: Internal Medicine

## 2014-03-10 NOTE — Assessment & Plan Note (Addendum)
Recurrent , with last year's occurrence considered secondary to PCOS.  checking TSH, FSH, and LH.  If all are normal,  Consider PCOS

## 2014-03-10 NOTE — Assessment & Plan Note (Addendum)
Current normotensive without medications since she lost weight. Renal function was normal in late July   Lab Results  Component Value Date   CREATININE 0.8 10/15/2013   Lab Results  Component Value Date   NA 138 10/15/2013   K 3.8 10/15/2013   CL 104 10/15/2013   CO2 28 10/15/2013

## 2014-03-10 NOTE — Assessment & Plan Note (Signed)
I have congratulated her in reduction of   BMI and encouraged  Continued weight loss with goal of 10% of body weigh over the next 6 months using a low glycemic index diet and regular exercise a minimum of 5 days per week.    

## 2014-03-10 NOTE — Assessment & Plan Note (Signed)
Likely secondary to polycystic ovarian syndrome given her metabolic state. Recommended weight loss and regular exercise.

## 2014-03-24 ENCOUNTER — Ambulatory Visit: Payer: Commercial Managed Care - PPO

## 2014-03-24 ENCOUNTER — Other Ambulatory Visit (INDEPENDENT_AMBULATORY_CARE_PROVIDER_SITE_OTHER): Payer: Commercial Managed Care - PPO

## 2014-03-24 ENCOUNTER — Telehealth: Payer: Self-pay

## 2014-03-24 VITALS — BP 122/80 | HR 76

## 2014-03-24 DIAGNOSIS — I10 Essential (primary) hypertension: Secondary | ICD-10-CM

## 2014-03-24 DIAGNOSIS — N926 Irregular menstruation, unspecified: Secondary | ICD-10-CM

## 2014-03-24 DIAGNOSIS — E785 Hyperlipidemia, unspecified: Secondary | ICD-10-CM

## 2014-03-24 LAB — LIPID PANEL
CHOL/HDL RATIO: 7
Cholesterol: 372 mg/dL — ABNORMAL HIGH (ref 0–200)
HDL: 57 mg/dL (ref >39.00)
LDL CALC: 276 mg/dL — AB (ref 0–99)
NonHDL: 315
TRIGLYCERIDES: 196 mg/dL — AB (ref 0.0–149.0)
VLDL: 39.2 mg/dL (ref 0.0–40.0)

## 2014-03-24 LAB — CBC WITH DIFFERENTIAL/PLATELET
BASOS PCT: 0.5 % (ref 0.0–3.0)
Basophils Absolute: 0 10*3/uL (ref 0.0–0.1)
EOS ABS: 0.2 10*3/uL (ref 0.0–0.7)
Eosinophils Relative: 2.1 % (ref 0.0–5.0)
HCT: 41.7 % (ref 36.0–46.0)
HEMOGLOBIN: 14 g/dL (ref 12.0–15.0)
LYMPHS ABS: 3.3 10*3/uL (ref 0.7–4.0)
Lymphocytes Relative: 40.7 % (ref 12.0–46.0)
MCHC: 33.6 g/dL (ref 30.0–36.0)
MCV: 92.5 fl (ref 78.0–100.0)
Monocytes Absolute: 0.5 10*3/uL (ref 0.1–1.0)
Monocytes Relative: 5.7 % (ref 3.0–12.0)
NEUTROS ABS: 4.1 10*3/uL (ref 1.4–7.7)
Neutrophils Relative %: 51 % (ref 43.0–77.0)
Platelets: 303 10*3/uL (ref 150.0–400.0)
RBC: 4.51 Mil/uL (ref 3.87–5.11)
RDW: 13.4 % (ref 11.5–15.5)
WBC: 8.1 10*3/uL (ref 4.0–10.5)

## 2014-03-24 LAB — COMPREHENSIVE METABOLIC PANEL
ALT: 23 U/L (ref 0–35)
AST: 23 U/L (ref 0–37)
Albumin: 4.1 g/dL (ref 3.5–5.2)
Alkaline Phosphatase: 63 U/L (ref 39–117)
BUN: 14 mg/dL (ref 6–23)
CALCIUM: 9.4 mg/dL (ref 8.4–10.5)
CHLORIDE: 104 meq/L (ref 96–112)
CO2: 27 mEq/L (ref 19–32)
CREATININE: 0.8 mg/dL (ref 0.4–1.2)
GFR: 83.41 mL/min (ref >60.00)
Glucose, Bld: 96 mg/dL (ref 70–99)
Potassium: 4.4 mEq/L (ref 3.5–5.1)
Sodium: 138 mEq/L (ref 135–145)
Total Bilirubin: 0.7 mg/dL (ref 0.2–1.2)
Total Protein: 7.2 g/dL (ref 6.0–8.3)

## 2014-03-24 LAB — TSH: TSH: 3.29 u[IU]/mL (ref 0.35–4.50)

## 2014-03-24 LAB — FOLLICLE STIMULATING HORMONE: FSH: 25.8 m[IU]/mL

## 2014-03-24 LAB — LUTEINIZING HORMONE: LH: 19.67 m[IU]/mL

## 2014-03-24 NOTE — Telephone Encounter (Signed)
Patient was seen in office today to have her blood pressure checked after stopping medication to treat hypertension. Had patient sit in the chair with feet flat on the ground in silence for 5 minutes. Took patient's blood pressure using a manual, normal size cuff on her right arm. Patient blood pressure was 122/80 pulse was 76. Note will be routed to PCP for review.

## 2014-03-24 NOTE — Progress Notes (Signed)
Patient was seen in office today to have her blood pressure checked after stopping medication to treat hypertension. Had patient sit in the chair with feet flat on the ground in silence for 5 minutes. Took patient's blood pressure using a manual, normal size cuff on her right arm. Patient blood pressure was 122/80 pulse was 76. Note will be routed to PCP for review.

## 2014-05-02 ENCOUNTER — Encounter: Payer: Self-pay | Admitting: Internal Medicine

## 2014-05-02 ENCOUNTER — Ambulatory Visit (INDEPENDENT_AMBULATORY_CARE_PROVIDER_SITE_OTHER): Payer: 59 | Admitting: Internal Medicine

## 2014-05-02 VITALS — BP 132/74 | HR 75 | Temp 98.3°F | Resp 16 | Ht 63.0 in | Wt 187.5 lb

## 2014-05-02 DIAGNOSIS — E669 Obesity, unspecified: Secondary | ICD-10-CM

## 2014-05-02 DIAGNOSIS — Z23 Encounter for immunization: Secondary | ICD-10-CM

## 2014-05-02 DIAGNOSIS — N926 Irregular menstruation, unspecified: Secondary | ICD-10-CM

## 2014-05-02 DIAGNOSIS — E785 Hyperlipidemia, unspecified: Secondary | ICD-10-CM

## 2014-05-02 DIAGNOSIS — Z87891 Personal history of nicotine dependence: Secondary | ICD-10-CM

## 2014-05-02 NOTE — Progress Notes (Signed)
Pre-visit discussion using our clinic review tool. No additional management support is needed unless otherwise documented below in the visit note.  

## 2014-05-02 NOTE — Progress Notes (Signed)
Patient ID: Christina Stafford, female   DOB: September 25, 1970, 44 y.o.   MRN: [id number]  Patient Active Problem List   Diagnosis Date Noted  . Irregular menstrual bleeding 03/07/2014  . PMS (premenstrual syndrome) 07/06/2013  . GERD (gastroesophageal reflux disease) 12/01/2012  . Palpitations 11/06/2012  . Panic disorder (episodic paroxysmal anxiety) 11/04/2012  . Menstrual irregularity 01/23/2012  . History of tobacco abuse 03/26/2011  . Screening for cervical cancer 03/26/2011  . OSA on CPAP   . Carpal tunnel syndrome on both sides   . Depression   . Hyperlipidemia   . Hypertension   . Obesity     Subjective:  CC:   Chief Complaint  Patient presents with  . Follow-up    Cholesterol    HPI:   Christina Stafford is a 44 y.o. female who presents for  follow up on recent fasting labs including a fasting lipid panel indicated moderate Hyperlipidemia.  Patient reports that the labs were drawn after 3 months of poor dietary habits and sedentary lifestyle due family stressors including the death of her father in law who died in 03/07/23.  For the last several weeks she has been making a concerted effort to change her lifestyle.  She is particpitating in a community sponsored weight loss challenge using both diet and exercise.  She has a personal gaol for 150 lbs as a target weight .  She is currently eating  A carb , calorie reduced diet involving  6 meals daily    Her thyroid function was normal, as were her [place] and LF, checked due to new onset menstrual  irregularities and hot flashes,    Past Medical History  Diagnosis Date  . Sleep apnea 2011    using CPAP 6 cm H20  [facility] Sleep Study  . Carpal tunnel syndrome on both sides   . Depression   . Hyperlipidemia   . Hypertension   . Obesity     Past Surgical History  Procedure Laterality Date  . Breast surgery  2003    negative biopsy  . Cholecystectomy  1995  . Tonsillectomy and adenoidectomy  2003       The  following portions of the patient's history were reviewed and updated as appropriate: Allergies, current medications, and problem list.    Review of Systems:   Patient denies headache, fevers, malaise, unintentional weight loss, skin rash, eye pain, sinus congestion and sinus pain, sore throat, dysphagia,  hemoptysis , cough, dyspnea, wheezing, chest pain, palpitations, orthopnea, edema, abdominal pain, nausea, melena, diarrhea, constipation, flank pain, dysuria, hematuria, urinary  Frequency, nocturia, numbness, tingling, seizures,  Focal weakness, Loss of consciousness,  Tremor, insomnia, depression, anxiety, and suicidal ideation.     History   Social History  . Marital Status: Married    Spouse Name: N/A  . Number of Children: N/A  . Years of Education: N/A   Occupational History  . Not on file.   Social History Main Topics  . Smoking status: Former Smoker    Start date: 05/17/2011  . Smokeless tobacco: Not on file  . Alcohol Use: 1.8 oz/week    3 Glasses of wine per week  . Drug Use: Not on file  . Sexual Activity: Not on file   Other Topics Concern  . Not on file   Social History Narrative    Objective:  Filed Vitals:   05/02/14 1424  BP: 132/74  Pulse: 75  Temp: 98.3 F (36.8 C)  Resp: 16  General appearance: alert, cooperative and appears stated age Lungs: clear to auscultation bilaterally Heart: regular rate and rhythm, S1, S2 normal, no murmur, click, rub or gallop  Pulses: 2+ and symmetric Skin: Skin color, texture, turgor normal. No rashes or lesions   Assessment and Plan:  Obesity I have congratulated her in reduction of   BMI and encouraged  Continued weight loss with goal of 10% of body weigh over the next 6 months using a low glycemic index diet and regular exercise a minimum of 5 days per week. Her personal goal is 150 lbs  Based on her height,    BMI < 30 : 169 lbs  BMI < 25 :  140 Lbs     Menstrual irregularity Patient may be  perimenopausal or developing PCOS.  Weight loss and management of metabolic syndrome advised   Hyperlipidemia New ACC guidelines recommend starting patients aged 46 or higher on moderate intensity statin therapy for LDL between 70-189 and 10 yr risk of CAD > 7.5% ;  and high intensity therapy for anyone with LDL > 190. She has requested deferring until summer after she has made a comprehensive lifestyle change.    History of tobacco abuse She remains tobacco abstinent.    A total of 25 minutes of face to face time was spent with patient more than half of which was spent in counselling about the above mentioned conditions  and coordination of care  Updated Medication List Outpatient Encounter Prescriptions as of 05/02/2014  Medication Sig  . ALPRAZolam (XANAX) 0.5 MG tablet Take 1 tablet (0.5 mg total) by mouth 2 (two) times daily as needed for sleep or anxiety.  . busPIRone (BUSPAR) 5 MG tablet Take 1 tablet (5 mg total) by mouth 3 (three) times daily.  . fluticasone (FLONASE) 50 MCG/ACT nasal spray Place 2 sprays into both nostrils daily.  Marland Kitchen omeprazole (PRILOSEC OTC) 20 MG tablet Take 20 mg by mouth daily.  . cetirizine (ZYRTEC) 10 MG tablet Take one by mouth daily when needed.      Orders Placed This Encounter  Procedures  . Td vaccine greater than 7yo preservative free IM  . Lipid panel  . Comprehensive metabolic panel    Return in about 4 months (around 08/31/2014).

## 2014-05-02 NOTE — Patient Instructions (Addendum)
Joseph's Pita Bread and Lavash breads are  low carb and low calorie.    They taste better toasted and work great with   Hummous '(available  at [employer] and at [facility])    We will repeat your fasting labs in 4 months   You received a tetanus booster today

## 2014-05-03 ENCOUNTER — Encounter: Payer: Self-pay | Admitting: Internal Medicine

## 2014-05-03 NOTE — Assessment & Plan Note (Signed)
New ACC guidelines recommend starting patients aged 46 or higher on moderate intensity statin therapy for LDL between 70-189 and 10 yr risk of CAD > 7.5% ;  and high intensity therapy for anyone with LDL > 190. She has requested deferring until summer after she has made a comprehensive lifestyle change.

## 2014-05-03 NOTE — Assessment & Plan Note (Signed)
Patient may be perimenopausal or developing PCOS.  Weight loss and management of metabolic syndrome advised

## 2014-05-03 NOTE — Assessment & Plan Note (Signed)
She remains tobacco abstinent.

## 2014-05-03 NOTE — Assessment & Plan Note (Signed)
I have congratulated her in reduction of   BMI and encouraged  Continued weight loss with goal of 10% of body weigh over the next 6 months using a low glycemic index diet and regular exercise a minimum of 5 days per week. Her personal goal is 150 lbs  Based on her height,    BMI < 30 : 169 lbs  BMI < 25 :  140 Lbs

## 2014-07-27 ENCOUNTER — Encounter: Payer: Self-pay | Admitting: Internal Medicine

## 2014-08-02 ENCOUNTER — Other Ambulatory Visit (INDEPENDENT_AMBULATORY_CARE_PROVIDER_SITE_OTHER): Payer: 59

## 2014-08-02 DIAGNOSIS — E785 Hyperlipidemia, unspecified: Secondary | ICD-10-CM

## 2014-08-02 DIAGNOSIS — E669 Obesity, unspecified: Secondary | ICD-10-CM | POA: Diagnosis not present

## 2014-08-02 LAB — COMPREHENSIVE METABOLIC PANEL
ALT: 22 U/L (ref 0–35)
AST: 27 U/L (ref 0–37)
Albumin: 4.4 g/dL (ref 3.5–5.2)
Alkaline Phosphatase: 57 U/L (ref 39–117)
BILIRUBIN TOTAL: 0.5 mg/dL (ref 0.2–1.2)
BUN: 16 mg/dL (ref 6–23)
CALCIUM: 9.9 mg/dL (ref 8.4–10.5)
CHLORIDE: 102 meq/L (ref 96–112)
CO2: 29 mEq/L (ref 19–32)
Creatinine, Ser: 0.83 mg/dL (ref 0.40–1.20)
GFR: 78.67 mL/min (ref >60.00)
GLUCOSE: 89 mg/dL (ref 70–99)
Potassium: 3.7 mEq/L (ref 3.5–5.1)
Sodium: 136 mEq/L (ref 135–145)
TOTAL PROTEIN: 7.3 g/dL (ref 6.0–8.3)

## 2014-08-02 LAB — LIPID PANEL
CHOLESTEROL: 246 mg/dL — AB (ref 0–200)
HDL: 63.5 mg/dL (ref >39.00)
LDL Cholesterol: 165 mg/dL — ABNORMAL HIGH (ref 0–99)
NonHDL: 182.5
Total CHOL/HDL Ratio: 4
Triglycerides: 90 mg/dL (ref 0.0–149.0)
VLDL: 18 mg/dL (ref 0.0–40.0)

## 2014-08-03 ENCOUNTER — Encounter: Payer: Self-pay | Admitting: Internal Medicine

## 2014-10-11 ENCOUNTER — Emergency Department: Payer: 59

## 2014-10-11 ENCOUNTER — Encounter: Payer: Self-pay | Admitting: Emergency Medicine

## 2014-10-11 ENCOUNTER — Emergency Department
Admission: EM | Admit: 2014-10-11 | Discharge: 2014-10-11 | Disposition: A | Discharge status: Home / Self Care | Payer: 59 | Attending: Emergency Medicine | Admitting: Emergency Medicine

## 2014-10-11 ENCOUNTER — Other Ambulatory Visit: Payer: Self-pay

## 2014-10-11 DIAGNOSIS — Z87891 Personal history of nicotine dependence: Secondary | ICD-10-CM | POA: Insufficient documentation

## 2014-10-11 DIAGNOSIS — R0602 Shortness of breath: Secondary | ICD-10-CM | POA: Diagnosis not present

## 2014-10-11 DIAGNOSIS — Z79899 Other long term (current) drug therapy: Secondary | ICD-10-CM | POA: Diagnosis not present

## 2014-10-11 DIAGNOSIS — R079 Chest pain, unspecified: Secondary | ICD-10-CM | POA: Insufficient documentation

## 2014-10-11 DIAGNOSIS — I10 Essential (primary) hypertension: Secondary | ICD-10-CM | POA: Insufficient documentation

## 2014-10-11 HISTORY — DX: Gastro-esophageal reflux disease without esophagitis: K21.9

## 2014-10-11 LAB — CBC
HEMATOCRIT: 42.7 % (ref 35.0–47.0)
Hemoglobin: 14.5 g/dL (ref 12.0–16.0)
MCH: 31.7 pg (ref 26.0–34.0)
MCHC: 34 g/dL (ref 32.0–36.0)
MCV: 93.3 fL (ref 80.0–100.0)
Platelets: 268 10*3/uL (ref 150–440)
RBC: 4.57 MIL/uL (ref 3.80–5.20)
RDW: 13.6 % (ref 11.5–14.5)
WBC: 8.2 10*3/uL (ref 3.6–11.0)

## 2014-10-11 LAB — BASIC METABOLIC PANEL
Anion gap: 7 (ref 5–15)
BUN: 10 mg/dL (ref 6–20)
CO2: 25 mmol/L (ref 22–32)
Calcium: 9 mg/dL (ref 8.9–10.3)
Chloride: 106 mmol/L (ref 101–111)
Creatinine, Ser: 0.77 mg/dL (ref 0.44–1.00)
GFR calc Af Amer: >60 mL/min (ref >60)
GFR calc non Af Amer: >60 mL/min (ref >60)
GLUCOSE: 131 mg/dL — AB (ref 65–99)
POTASSIUM: 4.2 mmol/L (ref 3.5–5.1)
SODIUM: 138 mmol/L (ref 135–145)

## 2014-10-11 LAB — TROPONIN I: Troponin I: <0.03 ng/mL (ref <0.031)

## 2014-10-11 NOTE — ED Notes (Signed)
Patient denies chest pain at this time. Denies any symptoms

## 2014-10-11 NOTE — Discharge Instructions (Signed)
Please seek medical attention for any high fevers, chest pain, shortness of breath, change in behavior, persistent vomiting, bloody stool or any other new or concerning symptoms. ° °Chest Pain (Nonspecific) °It is often hard to give a specific diagnosis for the cause of chest pain. There is always a chance that your pain could be related to something serious, such as a heart attack or a blood clot in the lungs. You need to follow up with your health care provider for further evaluation. °CAUSES  °· Heartburn. °· Pneumonia or bronchitis. °· Anxiety or stress. °· Inflammation around your heart (pericarditis) or lung (pleuritis or pleurisy). °· A blood clot in the lung. °· A collapsed lung (pneumothorax). It can develop suddenly on its own (spontaneous pneumothorax) or from trauma to the chest. °· Shingles infection (herpes zoster virus). °The chest wall is composed of bones, muscles, and cartilage. Any of these can be the source of the pain. °· The bones can be bruised by injury. °· The muscles or cartilage can be strained by coughing or overwork. °· The cartilage can be affected by inflammation and become sore (costochondritis). °DIAGNOSIS  °Lab tests or other studies may be needed to find the cause of your pain. Your health care provider may have you take a test called an ambulatory electrocardiogram (ECG). An ECG records your heartbeat patterns over a 24-hour period. You may also have other tests, such as: °· Transthoracic echocardiogram (TTE). During echocardiography, sound waves are used to evaluate how blood flows through your heart. °· Transesophageal echocardiogram (TEE). °· Cardiac monitoring. This allows your health care provider to monitor your heart rate and rhythm in real time. °· Holter monitor. This is a portable device that records your heartbeat and can help diagnose heart arrhythmias. It allows your health care provider to track your heart activity for several days, if needed. °· Stress tests by  exercise or by giving medicine that makes the heart beat faster. °TREATMENT  °· Treatment depends on what may be causing your chest pain. Treatment may include: °¨ Acid blockers for heartburn. °¨ Anti-inflammatory medicine. °¨ Pain medicine for inflammatory conditions. °¨ Antibiotics if an infection is present. °· You may be advised to change lifestyle habits. This includes stopping smoking and avoiding alcohol, caffeine, and chocolate. °· You may be advised to keep your head raised (elevated) when sleeping. This reduces the chance of acid going backward from your stomach into your esophagus. °Most of the time, nonspecific chest pain will improve within 2-3 days with rest and mild pain medicine.  °HOME CARE INSTRUCTIONS  °· If antibiotics were prescribed, take them as directed. Finish them even if you start to feel better. °· For the next few days, avoid physical activities that bring on chest pain. Continue physical activities as directed. °· Do not use any tobacco products, including cigarettes, chewing tobacco, or electronic cigarettes. °· Avoid drinking alcohol. °· Only take medicine as directed by your health care provider. °· Follow your health care provider's suggestions for further testing if your chest pain does not go away. °· Keep any follow-up appointments you made. If you do not go to an appointment, you could develop lasting (chronic) problems with pain. If there is any problem keeping an appointment, call to reschedule. °SEEK MEDICAL CARE IF:  °· Your chest pain does not go away, even after treatment. °· You have a rash with blisters on your chest. °· You have a fever. °SEEK IMMEDIATE MEDICAL CARE IF:  °· You have increased chest   pain or pain that spreads to your arm, neck, jaw, back, or abdomen. °· You have shortness of breath. °· You have an increasing cough, or you cough up blood. °· You have severe back or abdominal pain. °· You feel nauseous or vomit. °· You have severe weakness. °· You  faint. °· You have chills. °This is an emergency. Do not wait to see if the pain will go away. Get medical help at once. Call your local emergency services (911 in [place] Do not drive yourself to the hospital. °MAKE SURE YOU:  °· Understand these instructions. °· Will watch your condition. °· Will get help right away if you are not doing well or get worse. °Document Released: 12/12/2004 Document Revised: 03/09/2013 Document Reviewed: 10/08/2007 °ExitCare® Patient Information ©2015 [employer]. This information is not intended to replace advice given to you by your health care provider. Make sure you discuss any questions you have with your health care provider. ° °

## 2014-10-11 NOTE — ED Provider Notes (Signed)
[facility] Emergency Department Provider Note    ____________________________________________  Time seen: 1310  I have reviewed the triage vital signs and the nursing notes.   HISTORY  Chief Complaint Chest Pain   History limited by: Not Limited   HPI Christina Stafford is a 44 y.o. female who presented to the emergency department today because of concerns for chest pain. Patient states that she has had intermittent chest pain for 4 days. She describes the pain as being located in her center chest and right chest. She describes a pressure type pain with occasional episodes of sharp pain. She denies any exertional component to the pain. She had a little bit of shortness breath this morning but otherwise has not had any shortness of breath with the chest pain. She denies any fevers or diaphoresis. She denies similar symptoms in the past.   Past Medical History  Diagnosis Date  . Sleep apnea 2011    using CPAP 6 cm H20  [facility] Sleep Study  . Carpal tunnel syndrome on both sides   . Depression   . Hyperlipidemia   . Hypertension   . Obesity   . GERD (gastroesophageal reflux disease)     Patient Active Problem List   Diagnosis Date Noted  . Irregular menstrual bleeding 03/07/2014  . PMS (premenstrual syndrome) 07/06/2013  . GERD (gastroesophageal reflux disease) 12/01/2012  . Palpitations 11/06/2012  . Panic disorder (episodic paroxysmal anxiety) 11/04/2012  . Menstrual irregularity 01/23/2012  . History of tobacco abuse 03/26/2011  . Screening for cervical cancer 03/26/2011  . OSA on CPAP   . Carpal tunnel syndrome on both sides   . Depression   . Hyperlipidemia   . Hypertension   . Obesity     Past Surgical History  Procedure Laterality Date  . Breast surgery  2003    negative biopsy  . Cholecystectomy  1995  . Tonsillectomy and adenoidectomy  2003    Current Outpatient Rx  Name  Route  Sig  Dispense  Refill  . ALPRAZolam (XANAX)  0.5 MG tablet   Oral   Take 1 tablet (0.5 mg total) by mouth 2 (two) times daily as needed for sleep or anxiety.   30 tablet   2   . busPIRone (BUSPAR) 5 MG tablet   Oral   Take 1 tablet (5 mg total) by mouth 3 (three) times daily.   90 tablet   1   . cetirizine (ZYRTEC) 10 MG tablet      Take one by mouth daily when needed.          . fluticasone (FLONASE) 50 MCG/ACT nasal spray   Each Nare   Place 2 sprays into both nostrils daily.   16 g   6   . omeprazole (PRILOSEC OTC) 20 MG tablet   Oral   Take 20 mg by mouth daily.           Allergies Review of patient's allergies indicates no known allergies.  Family History  Problem Relation Age of Onset  . Hypertension Mother   . Diabetes Mother   . Hyperlipidemia Father     Social History History  Substance Use Topics  . Smoking status: Former Smoker    Start date: 05/17/2011  . Smokeless tobacco: Not on file  . Alcohol Use: 1.8 oz/week    3 Glasses of wine per week    Review of Systems  Constitutional: Negative for fever. Cardiovascular: Positive for chest pain. Respiratory: Positive for  shortness of breath. Gastrointestinal: Negative for abdominal pain, vomiting and diarrhea. Genitourinary: Negative for dysuria. Musculoskeletal: Negative for back pain. Skin: Negative for rash. Neurological: Negative for headaches, focal weakness or numbness.   10-point ROS otherwise negative.  ____________________________________________   PHYSICAL EXAM:  VITAL SIGNS: ED Triage Vitals  Enc Vitals Group     BP 10/11/14 1047 153/95 mmHg     Pulse Rate 10/11/14 1047 81     Resp 10/11/14 1047 20     Temp 10/11/14 1047 98.6 F (37 C)     Temp Source 10/11/14 1047 Oral     SpO2 10/11/14 1047 100 %     Weight 10/11/14 1047 178 lb (80.74 kg)     Height 10/11/14 1047 5' 3"  (1.6 m)     Head Cir --      Peak Flow --      Pain Score 10/11/14 1048 2   Constitutional: Alert and oriented. Well appearing and in no  distress. Eyes: Conjunctivae are normal. PERRL. Normal extraocular movements. ENT   Head: Normocephalic and atraumatic.   Nose: No congestion/rhinnorhea.   Mouth/Throat: Mucous membranes are moist.   Neck: No stridor. Hematological/Lymphatic/Immunilogical: No cervical lymphadenopathy. Cardiovascular: Normal rate, regular rhythm.  No murmurs, rubs, or gallops. Respiratory: Normal respiratory effort without tachypnea nor retractions. Breath sounds are clear and equal bilaterally. No wheezes/rales/rhonchi. Genitourinary: Deferred Musculoskeletal: Normal range of motion in all extremities. No joint effusions.  No lower extremity tenderness nor edema. Neurologic:  Normal speech and language. No gross focal neurologic deficits are appreciated. Speech is normal.  Skin:  Skin is warm, dry and intact. No rash noted. Psychiatric: Mood and affect are normal. Speech and behavior are normal. Patient exhibits appropriate insight and judgment.  ____________________________________________    LABS (pertinent positives/negatives) Labs Reviewed  BASIC METABOLIC PANEL - Abnormal; Notable for the following:    Glucose, Bld 131 (*)    All other components within normal limits  CBC  TROPONIN I     ____________________________________________   EKG  I, Nance Pear, attending physician, personally viewed and interpreted this EKG  EKG Time: 1046 Rate: 83 Rhythm: Normal sinus rhythm  Axis: Normal Intervals: qtc 465 QRS: Narrow ST changes: No ST elevation  ____________________________________________    RADIOLOGY  Chest x-ray  IMPRESSION: No edema or consolidation. ____________________________________________   PROCEDURES  Procedure(s) performed: None  Critical Care performed: No  ____________________________________________   INITIAL IMPRESSION / ASSESSMENT AND PLAN / ED COURSE  Pertinent labs & imaging results that were available during my care of the patient  were reviewed by me and considered in my medical decision making (see chart for details).  Patient presented to the emergency department today because of concerns for pressure type and sharp chest pain in the center and right chest. This is been going on intermittently for the past 4 days. EKG without any acute findings. Additionally Trop negative. I think ACS unlikely given clinical story, negative troponin. Additionally patient perc negative. Chest x-ray without any concerning findings. Discussed with patient that this could be inflammation of the chest wall. I did however discuss the patient following up with cardiology. Will discharge home.  ____________________________________________   FINAL CLINICAL IMPRESSION(S) / ED DIAGNOSES  Final diagnoses:  Chest pain, unspecified chest pain type     Nance Pear, MD 10/11/14 1334

## 2014-10-11 NOTE — ED Notes (Signed)
Pt to ed with c/o intermittent chest pain since Friday.  Pt states she has sharp pain in right shoulder but a pressure in center of chest, also states she feels sob and weak.

## 2014-10-12 ENCOUNTER — Ambulatory Visit (INDEPENDENT_AMBULATORY_CARE_PROVIDER_SITE_OTHER): Payer: 59 | Admitting: Nurse Practitioner

## 2014-10-12 VITALS — BP 124/96 | HR 78 | Temp 99.2°F | Resp 16 | Ht 63.0 in | Wt 181.2 lb

## 2014-10-12 DIAGNOSIS — Z131 Encounter for screening for diabetes mellitus: Secondary | ICD-10-CM

## 2014-10-12 DIAGNOSIS — F410 Panic disorder [episodic paroxysmal anxiety] without agoraphobia: Secondary | ICD-10-CM

## 2014-10-12 DIAGNOSIS — E669 Obesity, unspecified: Secondary | ICD-10-CM | POA: Diagnosis not present

## 2014-10-12 LAB — HEMOGLOBIN A1C: Hgb A1c MFr Bld: 5.3 % (ref 4.6–6.5)

## 2014-10-12 MED ORDER — BUSPIRONE HCL 5 MG PO TABS: 5.0000 mg | ORAL_TABLET | Freq: Three times a day (TID) | ORAL | Status: DC

## 2014-10-12 NOTE — Patient Instructions (Signed)
Have fun at the beach and we will contact you with your results. Follow up in 2 weeks.

## 2014-10-12 NOTE — Progress Notes (Signed)
   Subjective:    Patient ID: Christina Stafford, female    DOB: 1970/10/02, 44 y.o.   MRN: [id number]  HPI  Christina Stafford is a 44 yo female with a CC of ER f/u with anxiety diagnosis.   1) Started last Friday, chest tightness, feels like a pressure almost like a sense of dread. Eating bland because food makes her queasy. Last Monday had light and wavy lines that lasted 30 minutes. Last 2 times had a bad headache within an hour, headache was relieved by rest. Took 2 Advil. Pt has GERD and takes prilosec daily. Added acid reliever otc as needed since Monday.   Avoided Xanax due to grogginess  Buspar took one yesterday and today   BMET- wnl except glucose 131 CBC- wnl Trponin I- wnl  EKG- NSR Chest X-ray- wnl   Feels this anxiety related. 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 Negative for fever, chills and diaphoresis.  Respiratory: Positive for chest tightness. Negative for shortness of breath and wheezing.   Cardiovascular: Negative for chest pain, palpitations and leg swelling.  Gastrointestinal: Positive for nausea. Negative for vomiting and diarrhea.  Skin: Negative for rash.  Neurological: Positive for headaches. Negative for dizziness, weakness and numbness.  Psychiatric/Behavioral: The patient is nervous/anxious.       Objective:   Physical Exam  Constitutional: She is oriented to person, place, and time. She appears well-developed and well-nourished. No distress.  BP 124/96 mmHg  Pulse 78  Temp(Src) 99.2 F (37.3 C)  Resp 16  Ht 5' 3"  (1.6 m)  Wt 181 lb 3.2 oz (82.192 kg)  BMI 32.11 kg/m2  SpO2 97%  LMP 09/30/2014   HENT:  Head: Normocephalic and atraumatic.  Right Ear: External ear normal.  Left Ear: External ear normal.  Cardiovascular: Normal rate, regular rhythm, normal heart sounds and intact distal pulses.  Exam reveals no gallop  and no friction rub.   No murmur heard. Pulmonary/Chest: Effort normal and breath sounds normal. No respiratory distress. She has no wheezes. She has no rales. She exhibits no tenderness.  Neurological: She is alert and oriented to person, place, and time. No cranial nerve deficit. She exhibits normal muscle tone. Coordination normal.  Skin: Skin is warm and dry. No rash noted. She is not diaphoretic.  Psychiatric: She has a normal mood and affect. Her behavior is normal. Judgment and thought content normal.  Patient is tearful       Assessment & Plan:

## 2014-10-12 NOTE — Progress Notes (Signed)
Pre visit review using our clinic review tool, if applicable. No additional management support is needed unless otherwise documented below in the visit note. 

## 2014-10-23 ENCOUNTER — Encounter: Payer: Self-pay | Admitting: Nurse Practitioner

## 2014-10-23 NOTE — Assessment & Plan Note (Addendum)
Pt was in ED w/ full workup negative for chest pains. Pt has hx of anxiety w/ panic attacks and feels it is now manifesting again. She has many stressors in her life. She uses Xanax sparingly and Buspar, but needs refill on this. Talked with pt about stress reduction techniques in addition to medication. FU in 2 wks.

## 2014-10-23 NOTE — Assessment & Plan Note (Signed)
Will obtain A1c as a screen for DM due to family hx and pt request.

## 2014-10-26 ENCOUNTER — Ambulatory Visit: Payer: 59 | Admitting: Nurse Practitioner

## 2014-11-01 DIAGNOSIS — E7849 Other hyperlipidemia: Secondary | ICD-10-CM | POA: Insufficient documentation

## 2014-11-01 DIAGNOSIS — F329 Major depressive disorder, single episode, unspecified: Secondary | ICD-10-CM | POA: Insufficient documentation

## 2014-11-03 ENCOUNTER — Ambulatory Visit: Payer: 59 | Admitting: Nurse Practitioner

## 2014-11-04 ENCOUNTER — Ambulatory Visit (INDEPENDENT_AMBULATORY_CARE_PROVIDER_SITE_OTHER): Payer: 59 | Admitting: Nurse Practitioner

## 2014-11-04 VITALS — BP 118/84 | HR 79 | Temp 98.4°F | Resp 16 | Ht 63.0 in | Wt 179.4 lb

## 2014-11-04 DIAGNOSIS — N898 Other specified noninflammatory disorders of vagina: Secondary | ICD-10-CM | POA: Diagnosis not present

## 2014-11-04 DIAGNOSIS — R011 Cardiac murmur, unspecified: Secondary | ICD-10-CM | POA: Diagnosis not present

## 2014-11-04 NOTE — Progress Notes (Signed)
Pre visit review using our clinic review tool, if applicable. No additional management support is needed unless otherwise documented below in the visit note. 

## 2014-11-04 NOTE — Progress Notes (Signed)
Patient ID: Christina Stafford, female    DOB: 07-28-1970  Age: 44 y.o. MRN: [id number]  CC: Follow-up   HPI Christina Stafford presents for follow up of chest pain.   1) Heart Murmur- scheduled for echo and stress test next Thursday.   2) Still having chest on and off with and without exertion  No chest pains with run this morning   3) White discharge with green tint she reports, no odor changes, burning/itching.                     History Christina Stafford has a past medical history of Sleep apnea (2011); Carpal tunnel syndrome on both sides; Depression; Hyperlipidemia; Hypertension; Obesity; and GERD (gastroesophageal reflux disease).   She has past surgical history that includes Breast surgery (2003); Cholecystectomy (1995); and Tonsillectomy and adenoidectomy (2003).   Her family history includes Diabetes in her mother; Hyperlipidemia in her father; Hypertension in her mother.She reports that she has quit smoking. She started smoking about 3 years ago. She does not have any smokeless tobacco history on file. She reports that she drinks about 1.8 oz of alcohol per week. She reports that she does not use illicit drugs.  Outpatient Prescriptions Prior to Visit  Medication Sig Dispense Refill  . ALPRAZolam (XANAX) 0.5 MG tablet Take 1 tablet (0.5 mg total) by mouth 2 (two) times daily as needed for sleep or anxiety. 30 tablet 2  . busPIRone (BUSPAR) 5 MG tablet Take 1 tablet (5 mg total) by mouth 3 (three) times daily. 90 tablet 1  . cetirizine (ZYRTEC) 10 MG tablet Take one by mouth daily when needed.     . fluticasone (FLONASE) 50 MCG/ACT nasal spray Place 2 sprays into both nostrils daily. 16 g 6  . omeprazole (PRILOSEC OTC) 20 MG tablet Take 20 mg by mouth daily.     No facility-administered medications prior to visit.    ROS Review of Systems  Constitutional: Negative for fever, chills, diaphoresis and fatigue.  Respiratory: Negative for chest tightness, shortness of breath  and wheezing.   Cardiovascular: Negative for chest pain, palpitations and leg swelling.  Gastrointestinal: Negative for nausea, vomiting and diarrhea.  Skin: Negative for rash.  Neurological: Negative for dizziness, weakness, numbness and headaches.  Psychiatric/Behavioral: The patient is nervous/anxious.     Objective:  BP 118/84 mmHg  Pulse 79  Temp(Src) 98.4 F (36.9 C)  Resp 16  Ht 5' 3"  (1.6 m)  Wt 179 lb 6.4 oz (81.375 kg)  BMI 31.79 kg/m2  SpO2 97%  LMP 09/30/2014  Physical Exam  Constitutional: She is oriented to person, place, and time. She appears well-developed and well-nourished. No distress.  HENT:  Head: Normocephalic and atraumatic.  Right Ear: External ear normal.  Left Ear: External ear normal.  Cardiovascular: Normal rate and regular rhythm.  Exam reveals no gallop and no friction rub.   Murmur heard. Pulmonary/Chest: Effort normal and breath sounds normal. No respiratory distress. She has no wheezes. She has no rales. She exhibits no tenderness.  Genitourinary: Vaginal discharge found.  Neurological: She is alert and oriented to person, place, and time. No cranial nerve deficit. She exhibits normal muscle tone. Coordination normal.  Skin: Skin is warm and dry. No rash noted. She is not diaphoretic.  Psychiatric: She has a normal mood and affect. Her behavior is normal. Judgment and thought content normal.   Assessment & Plan:   Christina Stafford was seen today for follow-up.  Diagnoses and all orders for  this visit:  Heart murmur  Vaginal discharge -     WET PREP BY MOLECULAR PROBE   I am having [name] maintain her cetirizine, ALPRAZolam, omeprazole, fluticasone, and busPIRone.  No orders of the defined types were placed in this encounter.     Follow-up: Return if symptoms worsen or fail to improve.

## 2014-11-05 LAB — WET PREP BY MOLECULAR PROBE
CANDIDA SPECIES: POSITIVE — AB
Gardnerella vaginalis: NEGATIVE
Trichomonas vaginosis: NEGATIVE

## 2014-11-07 ENCOUNTER — Other Ambulatory Visit: Payer: Self-pay | Admitting: Nurse Practitioner

## 2014-11-07 MED ORDER — FLUCONAZOLE 150 MG PO TABS: 150.0000 mg | ORAL_TABLET | Freq: Once | ORAL | Status: DC

## 2014-11-13 ENCOUNTER — Encounter: Payer: Self-pay | Admitting: Nurse Practitioner

## 2014-11-13 DIAGNOSIS — R011 Cardiac murmur, unspecified: Secondary | ICD-10-CM | POA: Insufficient documentation

## 2014-11-13 DIAGNOSIS — N898 Other specified noninflammatory disorders of vagina: Secondary | ICD-10-CM | POA: Insufficient documentation

## 2014-11-13 NOTE — Assessment & Plan Note (Signed)
Pt went for echo and stress test with cardiology. Will obtain results. Pt reports she is still having angina with/without exertion. She did go on a run this morning and reports no symptoms.

## 2014-11-13 NOTE — Assessment & Plan Note (Signed)
Worsening. Obtained wet prep, will wait for results before calling in a treatment. Will follow.

## 2014-12-07 ENCOUNTER — Encounter: Payer: Self-pay | Admitting: Internal Medicine

## 2014-12-07 DIAGNOSIS — Z9989 Dependence on other enabling machines and devices: Principal | ICD-10-CM

## 2014-12-07 DIAGNOSIS — G4733 Obstructive sleep apnea (adult) (pediatric): Secondary | ICD-10-CM

## 2014-12-08 ENCOUNTER — Other Ambulatory Visit: Payer: Self-pay | Admitting: Internal Medicine

## 2014-12-08 DIAGNOSIS — G4733 Obstructive sleep apnea (adult) (pediatric): Secondary | ICD-10-CM

## 2014-12-08 DIAGNOSIS — Z9989 Dependence on other enabling machines and devices: Principal | ICD-10-CM

## 2015-02-08 ENCOUNTER — Ambulatory Visit (INDEPENDENT_AMBULATORY_CARE_PROVIDER_SITE_OTHER): Payer: 59 | Admitting: Nurse Practitioner

## 2015-02-08 VITALS — BP 118/76 | HR 69 | Temp 98.3°F | Resp 14 | Ht 63.0 in | Wt 186.6 lb

## 2015-02-08 DIAGNOSIS — K2100 Gastro-esophageal reflux disease with esophagitis, without bleeding: Secondary | ICD-10-CM

## 2015-02-08 NOTE — Progress Notes (Signed)
Pre visit review using our clinic review tool, if applicable. No additional management support is needed unless otherwise documented below in the visit note. 

## 2015-02-08 NOTE — Progress Notes (Signed)
Patient ID: Christina Stafford, female    DOB: 12/10/1970  Age: 44 y.o. MRN: [id number]  CC: Pain When Swallowing   HPI Christina Stafford presents for CC of pain with swallowing.   1) Pt reports she had labs drawn then went to [employer] for an egg white delight (wasn't overly hot) when she was eating and reports a slight discomfort with swallowing for the rest of the day and a burning sensation in throat. She tried to eat small snack foods  Changed from prilosec to Nexium Liquid diet yesterday was helpful  Today: drank a protein shake for lunch  Improved today and less concern, but wanted to get it checked out  Also, reports gassy and bloating  History Christina has a past medical history of Sleep apnea (2011); Carpal tunnel syndrome on both sides; Depression; Hyperlipidemia; Hypertension; Obesity; and GERD (gastroesophageal reflux disease).   She has past surgical history that includes Breast surgery (2003); Cholecystectomy (1995); and Tonsillectomy and adenoidectomy (2003).   Her family history includes Diabetes in her mother; Hyperlipidemia in her father; Hypertension in her mother.She reports that she has quit smoking. She started smoking about 3 years ago. She does not have any smokeless tobacco history on file. She reports that she drinks about 1.8 oz of alcohol per week. She reports that she does not use illicit drugs.  Outpatient Prescriptions Prior to Visit  Medication Sig Dispense Refill  . ALPRAZolam (XANAX) 0.5 MG tablet Take 1 tablet (0.5 mg total) by mouth 2 (two) times daily as needed for sleep or anxiety. 30 tablet 2  . busPIRone (BUSPAR) 5 MG tablet Take 1 tablet (5 mg total) by mouth 3 (three) times daily. 90 tablet 1  . cetirizine (ZYRTEC) 10 MG tablet Take one by mouth daily when needed.     . fluconazole (DIFLUCAN) 150 MG tablet Take 1 tablet (150 mg total) by mouth once. 1 tablet 0  . fluticasone (FLONASE) 50 MCG/ACT nasal spray Place 2 sprays into both  nostrils daily. 16 g 6  . omeprazole (PRILOSEC OTC) 20 MG tablet Take 20 mg by mouth daily.     No facility-administered medications prior to visit.    ROS Review of Systems  Constitutional: Negative for fever, chills, diaphoresis and fatigue.  HENT: Positive for sore throat and trouble swallowing.   Eyes: Negative for visual disturbance.  Respiratory: Negative for chest tightness, shortness of breath and wheezing.   Cardiovascular: Negative for chest pain, palpitations and leg swelling.  Gastrointestinal: Positive for abdominal distention. Negative for nausea, vomiting, abdominal pain, diarrhea, constipation and blood in stool.  Skin: Negative for rash.  Neurological: Negative for dizziness, weakness, numbness and headaches.  Psychiatric/Behavioral: The patient is not nervous/anxious.     Objective:  BP 118/76 mmHg  Pulse 69  Temp(Src) 98.3 F (36.8 C)  Resp 14  Ht 5' 3"  (1.6 m)  Wt 186 lb 9.6 oz (84.641 kg)  BMI 33.06 kg/m2  SpO2 98%  Physical Exam  Constitutional: She is oriented to person, place, and time. She appears well-developed and well-nourished. No distress.  HENT:  Head: Normocephalic and atraumatic.  Right Ear: External ear normal.  Left Ear: External ear normal.  Mouth/Throat: No oropharyngeal exudate.  Neck: Normal range of motion. Neck supple. No JVD present. No tracheal deviation present. No thyromegaly present.  Cardiovascular: Normal rate and regular rhythm.  Exam reveals no gallop and no friction rub.   Murmur heard. Pulmonary/Chest: Effort normal and breath sounds normal. No respiratory distress. She  has no wheezes. She has no rales. She exhibits no tenderness.  Abdominal: Soft. Bowel sounds are normal. She exhibits no distension and no mass. There is no tenderness. There is no rebound and no guarding.  Lymphadenopathy:    She has no cervical adenopathy.  Neurological: She is alert and oriented to person, place, and time. No cranial nerve deficit. She  exhibits normal muscle tone. Coordination normal.  Skin: Skin is warm and dry. No rash noted. She is not diaphoretic.  Psychiatric: She has a normal mood and affect. Her behavior is normal. Judgment and thought content normal.   Assessment & Plan:   There are no diagnoses linked to this encounter. I am having Ms. Stafford maintain her cetirizine, ALPRAZolam, omeprazole, fluticasone, busPIRone, fluconazole, and esomeprazole.  Meds ordered this encounter  Medications  . esomeprazole (NEXIUM) 20 MG capsule    Sig: Take 20 mg by mouth daily at 12 noon.     Follow-up: Return if symptoms worsen or fail to improve.

## 2015-02-08 NOTE — Patient Instructions (Signed)
Let us know if Monday not helpful. Double up on Nexium (40 mg daily) and you can take tums if needed. Watch these foods below. Small bites, watch caffeine, and drink water.   Food Choices for Gastroesophageal Reflux Disease, Adult When you have gastroesophageal reflux disease (GERD), the foods you eat and your eating habits are very important. Choosing the right foods can help ease the discomfort of GERD. WHAT GENERAL GUIDELINES DO I NEED TO FOLLOW?  Choose fruits, vegetables, whole grains, low-fat dairy products, and low-fat meat, fish, and poultry.  Limit fats such as oils, salad dressings, butter, nuts, and avocado.  Keep a food diary to identify foods that cause symptoms.  Avoid foods that cause reflux. These may be different for different people.  Eat frequent small meals instead of three large meals each day.  Eat your meals slowly, in a relaxed setting.  Limit fried foods.  Cook foods using methods other than frying.  Avoid drinking alcohol.  Avoid drinking large amounts of liquids with your meals.  Avoid bending over or lying down until 2-3 hours after eating. WHAT FOODS ARE NOT RECOMMENDED? The following are some foods and drinks that may worsen your symptoms: Vegetables Tomatoes. Tomato juice. Tomato and spaghetti sauce. Chili peppers. Onion and garlic. Horseradish. Fruits Oranges, grapefruit, and lemon (fruit and juice). Meats High-fat meats, fish, and poultry. This includes hot dogs, ribs, ham, sausage, salami, and bacon. Dairy Whole milk and chocolate milk. Sour cream. Cream. Butter. Ice cream. Cream cheese.  Beverages Coffee and tea, with or without caffeine. Carbonated beverages or energy drinks. Condiments Hot sauce. Barbecue sauce.  Sweets/Desserts Chocolate and cocoa. Donuts. Peppermint and spearmint. Fats and Oils High-fat foods, including [place] fries and potato chips. Other Vinegar. Strong spices, such as black pepper, white pepper, red pepper,  cayenne, curry powder, cloves, ginger, and chili powder. The items listed above may not be a complete list of foods and beverages to avoid. Contact your dietitian for more information.   This information is not intended to replace advice given to you by your health care provider. Make sure you discuss any questions you have with your health care provider.   Document Released: 03/04/2005 Document Revised: 03/25/2014 Document Reviewed: 01/06/2013 Elsevier Interactive Patient Education [employer].

## 2015-02-19 ENCOUNTER — Encounter: Payer: Self-pay | Admitting: Nurse Practitioner

## 2015-02-19 NOTE — Assessment & Plan Note (Signed)
Probably GERD related. Denies trouble swallowing today. Switched to Nexium OTC she reports. Discussed continuing this, but doubling it to = 40 mg daily, take before meals, watch caffeine, add tums prn, small bites, and gave handout with foods to watch for GERD. RTC if worsens or changes in symptoms.

## 2015-05-15 ENCOUNTER — Telehealth: Payer: Self-pay | Admitting: *Deleted

## 2015-05-15 NOTE — Telephone Encounter (Signed)
Patient questioned if she should reschedule her appt, she has started her menstrual cycle.  shes scheduled to come in 05/16/15 for her physical/pap Please advise [phone]

## 2015-05-15 NOTE — Telephone Encounter (Signed)
Pt rescheduled 05/29/15 at 3:00pm

## 2015-05-15 NOTE — Telephone Encounter (Signed)
Pt is due to have her CPE 05/16/15, the pt is due to get her pap done on that day but she is on her menstrual cycle. She wants to know if she needs to reschedule. Please advise, thanks

## 2015-05-15 NOTE — Telephone Encounter (Signed)
Yes, please reschedule her

## 2015-05-16 ENCOUNTER — Encounter: Payer: 59 | Admitting: Internal Medicine

## 2015-05-29 ENCOUNTER — Ambulatory Visit (INDEPENDENT_AMBULATORY_CARE_PROVIDER_SITE_OTHER): Payer: 59 | Admitting: Internal Medicine

## 2015-05-29 ENCOUNTER — Encounter: Payer: Self-pay | Admitting: Internal Medicine

## 2015-05-29 ENCOUNTER — Other Ambulatory Visit (HOSPITAL_COMMUNITY)
Admission: RE | Admit: 2015-05-29 | Discharge: 2015-05-29 | Disposition: A | Discharge status: Home / Self Care | Payer: 59 | Source: Ambulatory Visit | Attending: Internal Medicine | Admitting: Internal Medicine

## 2015-05-29 VITALS — BP 138/76 | HR 64 | Temp 98.2°F | Resp 12 | Ht 63.0 in | Wt 188.2 lb

## 2015-05-29 DIAGNOSIS — E669 Obesity, unspecified: Secondary | ICD-10-CM

## 2015-05-29 DIAGNOSIS — Z0000 Encounter for general adult medical examination without abnormal findings: Secondary | ICD-10-CM

## 2015-05-29 DIAGNOSIS — Z1151 Encounter for screening for human papillomavirus (HPV): Secondary | ICD-10-CM | POA: Insufficient documentation

## 2015-05-29 DIAGNOSIS — Z9989 Dependence on other enabling machines and devices: Secondary | ICD-10-CM

## 2015-05-29 DIAGNOSIS — Z01419 Encounter for gynecological examination (general) (routine) without abnormal findings: Secondary | ICD-10-CM | POA: Insufficient documentation

## 2015-05-29 DIAGNOSIS — Z1239 Encounter for other screening for malignant neoplasm of breast: Secondary | ICD-10-CM | POA: Diagnosis not present

## 2015-05-29 DIAGNOSIS — E785 Hyperlipidemia, unspecified: Secondary | ICD-10-CM

## 2015-05-29 DIAGNOSIS — F32A Depression, unspecified: Secondary | ICD-10-CM

## 2015-05-29 DIAGNOSIS — N926 Irregular menstruation, unspecified: Secondary | ICD-10-CM | POA: Diagnosis not present

## 2015-05-29 DIAGNOSIS — Z23 Encounter for immunization: Secondary | ICD-10-CM | POA: Diagnosis not present

## 2015-05-29 DIAGNOSIS — Z124 Encounter for screening for malignant neoplasm of cervix: Secondary | ICD-10-CM

## 2015-05-29 DIAGNOSIS — Z7289 Other problems related to lifestyle: Secondary | ICD-10-CM | POA: Diagnosis not present

## 2015-05-29 DIAGNOSIS — F329 Major depressive disorder, single episode, unspecified: Secondary | ICD-10-CM

## 2015-05-29 DIAGNOSIS — E559 Vitamin D deficiency, unspecified: Secondary | ICD-10-CM

## 2015-05-29 DIAGNOSIS — G4733 Obstructive sleep apnea (adult) (pediatric): Secondary | ICD-10-CM

## 2015-05-29 MED ORDER — PREDNISONE 10 MG PO TABS: ORAL_TABLET | ORAL | Status: DC

## 2015-05-29 MED ORDER — SPIRONOLACTONE 25 MG PO TABS: 25.0000 mg | ORAL_TABLET | Freq: Every day | ORAL | Status: DC

## 2015-05-29 MED ORDER — SCOPOLAMINE 1 MG/3DAYS TD PT72: 1.0000 | MEDICATED_PATCH | TRANSDERMAL | Status: DC

## 2015-05-29 MED ORDER — BUSPIRONE HCL 5 MG PO TABS: 5.0000 mg | ORAL_TABLET | Freq: Three times a day (TID) | ORAL | Status: DC

## 2015-05-29 MED ORDER — LEVOFLOXACIN 500 MG PO TABS: 500.0000 mg | ORAL_TABLET | Freq: Every day | ORAL | Status: DC

## 2015-05-29 MED ORDER — MEDROXYPROGESTERONE ACETATE 5 MG PO TABS: 5.0000 mg | ORAL_TABLET | Freq: Every day | ORAL | Status: DC

## 2015-05-29 MED ORDER — ALPRAZOLAM 0.5 MG PO TABS: 0.5000 mg | ORAL_TABLET | Freq: Two times a day (BID) | ORAL | Status: DC | PRN

## 2015-05-29 NOTE — Progress Notes (Signed)
Patient ID: Christina Stafford, female    DOB: [birth year]  Age: 45 y.o. MRN: [id number]  The patient is here for annual GYN and  wellness examination and management of other chronic and acute problems.  Last PAP 2011 Last mammogram 2013 Needs flu vaccine     The risk factors are reflected in the social history.  The roster of all physicians providing medical care to patient - is listed in the Snapshot section of the chart.  Activities of daily living:  The patient is 100% independent in all ADLs: dressing, toileting, feeding as well as independent mobility  Home safety : The patient has smoke detectors in the home. They wear seatbelts.  There are no firearms at home. There is no violence in the home.   There is no risks for hepatitis, STDs or HIV. There is no   history of blood transfusion. They have no travel history to infectious disease endemic areas of the world.  The patient has seen their dentist in the last six month. They have seen their eye doctor in the last year. They admit to slight hearing difficulty with regard to whispered voices and some television programs.  They have deferred audiologic testing in the last year.  They do not  have excessive sun exposure. Discussed the need for sun protection: hats, long sleeves and use of sunscreen if there is significant sun exposure.   Diet: the importance of a healthy diet is discussed. They do have a healthy diet.  The benefits of regular aerobic exercise were discussed. She walks 4 times per week ,  20 minutes.   Depression screen: there are no signs or vegative symptoms of depression- irritability, change in appetite, anhedonia, sadness/tearfullness.  The following portions of the patient's history were reviewed and updated as appropriate: allergies, current medications, past family history, past medical history,  past surgical history, past social history  and problem list.  Visual acuity was not assessed per patient preference  since she has regular follow up with her ophthalmologist. Hearing and body mass index were assessed and reviewed.   During the course of the visit the patient was educated and counseled about appropriate screening and preventive services including : fall prevention , diabetes screening, nutrition counseling, colorectal cancer screening, and recommended immunizations.    CC: The primary encounter diagnosis was Breast cancer screening. Diagnoses of Cervical cancer screening, Hyperlipidemia, Irregular menstrual cycle, Other problems related to lifestyle, Vitamin D deficiency, Encounter for immunization, Irregular menstrual bleeding, Obesity, Depression, OSA on CPAP, Screening for cervical cancer, and Encounter for preventive health examination were also pertinent to this visit.  Last seen by me Feb 2016 for annual exam ;  She had . 3 visits after that by Lorane Gell,  For various issues including candida vaginitis,  negative screening for Type 2 DM given serum glucose of 136   Hyperlipidemia:  LDL of f 296  Jan 2016 . Patient deferred treatment and repeat LDL was 165 in May after dietary changes . Obesity: no weight loss yet but following a low GI diet and working with a trainer 3 times  weekly   tobacco abuse.: quit 5 years ago    Stress test and ECHO 2016 by Nehemiah Massed were normal   [place] trip in April .  scopalamine abs, discussed.  Doesn't want to have her period during trip.  Periods are occasionally irregular.  GAD:  Using buspirone and alprazolam prn  Wants refils   Nov 2016 for GERD  History Shawntrice has a past medical history of Sleep apnea (2011); Carpal tunnel syndrome on both sides; Depression; Hyperlipidemia; Hypertension; Obesity; and GERD (gastroesophageal reflux disease).   She has past surgical history that includes Breast surgery (2003); Cholecystectomy (1995); and Tonsillectomy and adenoidectomy (2003).   Her family history includes Diabetes in her mother;  Hyperlipidemia in her father; Hypertension in her mother.She reports that she has quit smoking. She started smoking about 4 years ago. She does not have any smokeless tobacco history on file. She reports that she drinks about 1.8 oz of alcohol per week. She reports that she does not use illicit drugs.  Outpatient Prescriptions Prior to Visit  Medication Sig Dispense Refill  . cetirizine (ZYRTEC) 10 MG tablet Take one by mouth daily when needed.     Marland Kitchen esomeprazole (NEXIUM) 20 MG capsule Take 20 mg by mouth daily at 12 noon.    . fluconazole (DIFLUCAN) 150 MG tablet Take 1 tablet (150 mg total) by mouth once. 1 tablet 0  . fluticasone (FLONASE) 50 MCG/ACT nasal spray Place 2 sprays into both nostrils daily. 16 g 6  . omeprazole (PRILOSEC OTC) 20 MG tablet Take 20 mg by mouth daily.    Marland Kitchen ALPRAZolam (XANAX) 0.5 MG tablet Take 1 tablet (0.5 mg total) by mouth 2 (two) times daily as needed for sleep or anxiety. 30 tablet 2  . busPIRone (BUSPAR) 5 MG tablet Take 1 tablet (5 mg total) by mouth 3 (three) times daily. 90 tablet 1   No facility-administered medications prior to visit.    Review of Systems   Patient denies headache, fevers, malaise, unintentional weight loss, skin rash, eye pain, sinus congestion and sinus pain, sore throat, dysphagia,  hemoptysis , cough, dyspnea, wheezing, chest pain, palpitations, orthopnea, edema, abdominal pain, nausea, melena, diarrhea, constipation, flank pain, dysuria, hematuria, urinary  Frequency, nocturia, numbness, tingling, seizures,  Focal weakness, Loss of consciousness,  Tremor, insomnia, depression, and suicidal ideation.      Objective:  BP 138/76 mmHg  Pulse 64  Temp(Src) 98.2 F (36.8 C) (Oral)  Resp 12  Ht 5' 3"  (1.6 m)  Wt 188 lb 4 oz (85.39 kg)  BMI 33.36 kg/m2  SpO2 99%  LMP 05/13/2015  Physical Exam  General Appearance:    Alert, cooperative, no distress, appears stated age  Head:    Normocephalic, without obvious abnormality,  atraumatic  Eyes:    PERRL, conjunctiva/corneas clear, EOM's intact, fundi    benign, both eyes  Ears:    Normal TM's and external ear canals, both ears  Nose:   Nares normal, septum midline, mucosa normal, no drainage    or sinus tenderness  Throat:   Lips, mucosa, and tongue normal; teeth and gums normal  Neck:   Supple, symmetrical, trachea midline, no adenopathy;    thyroid:  no enlargement/tenderness/nodules; no carotid   bruit or JVD  Back:     Symmetric, no curvature, ROM normal, no CVA tenderness  Lungs:     Clear to auscultation bilaterally, respirations unlabored  Chest Wall:    No tenderness or deformity   Heart:    Regular rate and rhythm, S1 and S2 normal, no murmur, rub   or gallop  Breast Exam:    No tenderness, masses, or nipple abnormality  Abdomen:     Soft, non-tender, bowel sounds active all four quadrants,    no masses, no organomegaly  Genitalia:    Pelvic: cervix normal in appearance, external genitalia normal, no adnexal masses or  tenderness, no cervical motion tenderness, rectovaginal septum normal, uterus normal size, shape, and consistency and vagina normal without discharge  Extremities:   Extremities normal, atraumatic, no cyanosis or edema  Pulses:   2+ and symmetric all extremities  Skin:   Skin color, texture, turgor normal, no rashes or lesions  Lymph nodes:   Cervical, supraclavicular, and axillary nodes normal  Neurologic:   CNII-XII intact, normal strength, sensation and reflexes    throughout      Assessment & Plan:   Problem List Items Addressed This Visit    OSA on CPAP    Diagnosed by prior sleep study. Patient is using CPAP every night a minimum of 6 hours per night and notes improved daytime wakefulness and decreased fatigue       Screening for cervical cancer    Done today       Depression    Resolved,   with treatment of sleep apnea . Has been off of Paxil .  Using buspirone and alprazolam for anxiety .  Refills given        Relevant Medications   ALPRAZolam (XANAX) 0.5 MG tablet   busPIRone (BUSPAR) 5 MG tablet   Hyperlipidemia    New ACC guidelines recommend starting patients aged 40 or higher on moderate intensity statin therapy for LDL between 70-189 and 10 yr risk of CAD > 7.5% ;  and high intensity therapy for anyone with LDL > 190. She has requested deferring until summer after she has made a comprehensive lifestyle change.   Lab Results  Component Value Date   CHOL 246* 08/02/2014   HDL 63.50 08/02/2014   LDLCALC 165* 08/02/2014   LDLDIRECT 176.5 05/21/2012   TRIG 90.0 08/02/2014   CHOLHDL 4 08/02/2014         Relevant Medications   spironolactone (ALDACTONE) 25 MG tablet   Other Relevant Orders   Lipid panel   Obesity    I have addressed  BMI and recommended wt loss of 10% of body weigh over the next 6 months using a low glycemic index diet and regular exercise a minimum of 5 days per week.        Irregular menstrual bleeding    Discussed ue of provera for 10 days to force menses priro to trip to [place]      Encounter for preventive health examination    Annual comprehensive preventive exam was done as well as an evaluation and management of chronic conditions .  During the course of the visit the patient was educated and counseled about appropriate screening and preventive services including :  diabetes screening, lipid analysis with projected  10 year  risk for CAD , nutrition counseling, breast, cervical and colorectal cancer screening, and recommended immunizations.  Printed recommendations for health maintenance screenings was given       Other Visit Diagnoses    Breast cancer screening    -  Primary    Relevant Orders    MM DIGITAL SCREENING BILATERAL    Cervical cancer screening        Relevant Orders    Cytology - PAP    Irregular menstrual cycle        Relevant Orders    Comprehensive metabolic panel    TSH    CBC with Differential/Platelet    Other problems related to  lifestyle        Relevant Orders    HIV antibody    Hepatitis C antibody    Vitamin D  deficiency        Relevant Orders    VITAMIN D 25 Hydroxy (Vit-D Deficiency, Fractures)    Encounter for immunization        Relevant Orders    Flu Vaccine QUAD 36+ mos IM       I am having Ms. Sealey start on medroxyPROGESTERone, spironolactone, predniSONE, levofloxacin, and scopolamine. I am also having her maintain her cetirizine, omeprazole, fluticasone, fluconazole, esomeprazole, ALPRAZolam, and busPIRone.  Meds ordered this encounter  Medications  . medroxyPROGESTERone (PROVERA) 5 MG tablet    Sig: Take 1 tablet (5 mg total) by mouth daily.    Dispense:  10 tablet    Refill:  0  . spironolactone (ALDACTONE) 25 MG tablet    Sig: Take 1 tablet (25 mg total) by mouth daily. As needed  For fluid retention    Dispense:  30 tablet    Refill:  0  . predniSONE (DELTASONE) 10 MG tablet    Sig: 6 tablets on Day 1 , then reduce by 1 tablet daily until gone    Dispense:  21 tablet    Refill:  0  . levofloxacin (LEVAQUIN) 500 MG tablet    Sig: Take 1 tablet (500 mg total) by mouth daily.    Dispense:  7 tablet    Refill:  0  . scopolamine (TRANSDERM-SCOP, 1.5 MG,) 1 MG/3DAYS    Sig: Place 1 patch (1.5 mg total) onto the skin every 3 (three) days.    Dispense:  4 patch    Refill:  0  . ALPRAZolam (XANAX) 0.5 MG tablet    Sig: Take 1 tablet (0.5 mg total) by mouth 2 (two) times daily as needed for sleep or anxiety.    Dispense:  30 tablet    Refill:  2  . busPIRone (BUSPAR) 5 MG tablet    Sig: Take 1 tablet (5 mg total) by mouth 3 (three) times daily.    Dispense:  90 tablet    Refill:  1    Medications Discontinued During This Encounter  Medication Reason  . ALPRAZolam (XANAX) 0.5 MG tablet Reorder  . busPIRone (BUSPAR) 5 MG tablet Reorder    Follow-up: No Follow-up on file.   Crecencio Mc, MD

## 2015-05-29 NOTE — Progress Notes (Signed)
Pre-visit discussion using our clinic review tool. No additional management support is needed unless otherwise documented below in the visit note.  

## 2015-05-29 NOTE — Patient Instructions (Addendum)
We're going to force your period to come EARLY so it doesn't ruin your [place] vacation.  Take the Provera for 10 days, then STOP.  Your period will come within the next few days  Use the spironolactone  As needed for fluid retention.  Wear the mask on the flight if you hear anybody that sounds infectious.  Use the levaquin for sinusitis, bronchitis,  Or UTI.  Use the prednisone for bronchitis or sinusitis  That feels more like a viral infection (cough,  Headcold, no pain or fevers)  Return for fasting labs soon        Medroxyprogesterone tablets What is this medicine? MEDROXYPROGESTERONE (me DROX ee proe JES te rone) is a hormone in a class called progestins. It is commonly used to prevent the uterine lining from overgrowth in women taking an estrogen after menopause. It is also used to treat irregular menstrual bleeding or a lack of menstrual bleeding in women. This medicine may be used for other purposes; ask your health care provider or pharmacist if you have questions. What should I tell my health care provider before I take this medicine? They need to know if you have any of these conditions: -blood vessel disease or a history of a blood clot in the lungs or legs -breast, cervical or vaginal cancer -heart disease -kidney disease -liver disease -migraine -recent miscarriage or abortion -mental depression -migraine -seizures (convulsions) -stroke -vaginal bleeding that has not been evaluated -an unusual or allergic reaction to medroxyprogesterone, other medicines, foods, dyes, or preservatives -pregnant or trying to get pregnant -breast-feeding How should I use this medicine? Take this medicine by mouth with a glass of water. Follow the directions on the prescription label. Take your doses at regular intervals. Do not take your medicine more often than directed. Talk to your pediatrician regarding the use of this medicine in children. Special care may be needed. While this  drug may be prescribed for children as young as 13 years for selected conditions, precautions do apply. Overdosage: If you think you have taken too much of this medicine contact a poison control center or emergency room at once. NOTE: This medicine is only for you. Do not share this medicine with others. What if I miss a dose? If you miss a dose, take it as soon as you can. If it is almost time for your next dose, take only that dose. Do not take double or extra doses. What may interact with this medicine? -barbiturate medicines for inducing sleep or treating seizures (convulsions) -bosentan -carbamazepine -phenytoin -rifampin -St. John's Wort This list may not describe all possible interactions. Give your health care provider a list of all the medicines, herbs, non-prescription drugs, or dietary supplements you use. Also tell them if you smoke, drink alcohol, or use illegal drugs. Some items may interact with your medicine. What should I watch for while using this medicine? Visit your health care professional for regular checks on your progress. You will need a regular breast and pelvic exam. If you have any reason to think you are pregnant, stop taking this medicine at once and contact your doctor or health care professional. What side effects may I notice from receiving this medicine? Side effects that you should report to your doctor or health care professional as soon as possible: -breast tenderness or discharge -changes in mood or emotions, such as depression -changes in vision or speech -pain in the abdomen, chest, groin, or leg -severe headache -skin rash, itching, or hives -sudden shortness  of breath -unusually weak or tired -yellowing of skin or eyes Side effects that usually do not require medical attention (report to your doctor or health care professional if they continue or are bothersome): -acne -change in menstrual bleeding pattern or flow -changes in sexual  desire -facial hair growth -fluid retention and swelling -headache -upset stomach -weight gain or loss This list may not describe all possible side effects. Call your doctor for medical advice about side effects. You may report side effects to FDA at 1-800-FDA-1088. Where should I keep my medicine? Keep out of the reach of children. Store at room temperature between 20 and 25 degrees C (68 and 77 degrees F). Throw away any unused medicine after the expiration date. NOTE: This sheet is a summary. It may not cover all possible information. If you have questions about this medicine, talk to your doctor, pharmacist, or health care provider.    2016, Elsevier/Gold Standard. (2008-03-03 11:26:12)

## 2015-05-31 DIAGNOSIS — Z0000 Encounter for general adult medical examination without abnormal findings: Secondary | ICD-10-CM | POA: Insufficient documentation

## 2015-05-31 NOTE — Assessment & Plan Note (Signed)
New ACC guidelines recommend starting patients aged 47 or higher on moderate intensity statin therapy for LDL between 70-189 and 10 yr risk of CAD > 7.5% ;  and high intensity therapy for anyone with LDL > 190. She has requested deferring until summer after she has made a comprehensive lifestyle change.   Lab Results  Component Value Date   CHOL 246* 08/02/2014   HDL 63.50 08/02/2014   LDLCALC 165* 08/02/2014   LDLDIRECT 176.5 05/21/2012   TRIG 90.0 08/02/2014   CHOLHDL 4 08/02/2014

## 2015-05-31 NOTE — Assessment & Plan Note (Signed)
Resolved,   with treatment of sleep apnea . Has been off of Paxil .  Using buspirone and alprazolam for anxiety .  Refills given

## 2015-05-31 NOTE — Assessment & Plan Note (Signed)
Discussed ue of provera for 10 days to force menses priro to trip to [place]

## 2015-05-31 NOTE — Assessment & Plan Note (Addendum)
Annual comprehensive preventive exam was done as well as an evaluation and management of chronic conditions .  During the course of the visit the patient was educated and counseled about appropriate screening and preventive services including :  diabetes screening, lipid analysis with projected  10 year  risk for CAD , nutrition counseling, breast, cervical and colorectal cancer screening, and recommended immunizations.  Printed recommendations for health maintenance screenings was given 

## 2015-05-31 NOTE — Assessment & Plan Note (Signed)
Done today.

## 2015-05-31 NOTE — Assessment & Plan Note (Signed)
Diagnosed by prior sleep study. Patient is using CPAP every night a minimum of 6 hours per night and notes improved daytime wakefulness and decreased fatigue  

## 2015-05-31 NOTE — Assessment & Plan Note (Signed)
I have addressed  BMI and recommended wt loss of 10% of body weigh over the next 6 months using a low glycemic index diet and regular exercise a minimum of 5 days per week.   

## 2015-06-02 ENCOUNTER — Encounter: Payer: Self-pay | Admitting: Internal Medicine

## 2015-06-02 LAB — CYTOLOGY - PAP

## 2015-06-07 ENCOUNTER — Other Ambulatory Visit (INDEPENDENT_AMBULATORY_CARE_PROVIDER_SITE_OTHER): Payer: 59

## 2015-06-07 DIAGNOSIS — N926 Irregular menstruation, unspecified: Secondary | ICD-10-CM

## 2015-06-07 DIAGNOSIS — E785 Hyperlipidemia, unspecified: Secondary | ICD-10-CM | POA: Diagnosis not present

## 2015-06-07 DIAGNOSIS — Z7289 Other problems related to lifestyle: Secondary | ICD-10-CM

## 2015-06-07 DIAGNOSIS — E559 Vitamin D deficiency, unspecified: Secondary | ICD-10-CM | POA: Diagnosis not present

## 2015-06-07 LAB — VITAMIN D 25 HYDROXY (VIT D DEFICIENCY, FRACTURES): VITD: 23.68 ng/mL — ABNORMAL LOW (ref 30.00–100.00)

## 2015-06-07 LAB — TSH: TSH: 2.89 u[IU]/mL (ref 0.35–4.50)

## 2015-06-07 LAB — COMPREHENSIVE METABOLIC PANEL
ALT: 19 U/L (ref 0–35)
AST: 22 U/L (ref 0–37)
Albumin: 4.5 g/dL (ref 3.5–5.2)
Alkaline Phosphatase: 62 U/L (ref 39–117)
BUN: 15 mg/dL (ref 6–23)
CALCIUM: 10.1 mg/dL (ref 8.4–10.5)
CO2: 28 meq/L (ref 19–32)
CREATININE: 0.84 mg/dL (ref 0.40–1.20)
Chloride: 102 mEq/L (ref 96–112)
GFR: 77.3 mL/min (ref >60.00)
Glucose, Bld: 90 mg/dL (ref 70–99)
Potassium: 4.3 mEq/L (ref 3.5–5.1)
Sodium: 138 mEq/L (ref 135–145)
Total Bilirubin: 0.5 mg/dL (ref 0.2–1.2)
Total Protein: 7.8 g/dL (ref 6.0–8.3)

## 2015-06-07 LAB — CBC WITH DIFFERENTIAL/PLATELET
BASOS PCT: 0.6 % (ref 0.0–3.0)
Basophils Absolute: 0 10*3/uL (ref 0.0–0.1)
EOS PCT: 1.7 % (ref 0.0–5.0)
Eosinophils Absolute: 0.1 10*3/uL (ref 0.0–0.7)
HCT: 43.2 % (ref 36.0–46.0)
Hemoglobin: 14.8 g/dL (ref 12.0–15.0)
LYMPHS ABS: 2.9 10*3/uL (ref 0.7–4.0)
Lymphocytes Relative: 48.3 % — ABNORMAL HIGH (ref 12.0–46.0)
MCHC: 34.2 g/dL (ref 30.0–36.0)
MCV: 91.6 fl (ref 78.0–100.0)
MONO ABS: 0.4 10*3/uL (ref 0.1–1.0)
Monocytes Relative: 6.6 % (ref 3.0–12.0)
NEUTROS PCT: 42.8 % — AB (ref 43.0–77.0)
Neutro Abs: 2.6 10*3/uL (ref 1.4–7.7)
Platelets: 310 10*3/uL (ref 150.0–400.0)
RBC: 4.72 Mil/uL (ref 3.87–5.11)
RDW: 13.4 % (ref 11.5–15.5)
WBC: 6.1 10*3/uL (ref 4.0–10.5)

## 2015-06-07 LAB — LIPID PANEL
CHOLESTEROL: 327 mg/dL — AB (ref 0–200)
HDL: 64.2 mg/dL (ref >39.00)
LDL CALC: 230 mg/dL — AB (ref 0–99)
NonHDL: 262.83
TRIGLYCERIDES: 162 mg/dL — AB (ref 0.0–149.0)
Total CHOL/HDL Ratio: 5
VLDL: 32.4 mg/dL (ref 0.0–40.0)

## 2015-06-08 LAB — HIV ANTIBODY (ROUTINE TESTING W REFLEX): HIV 1&2 Ab, 4th Generation: NONREACTIVE

## 2015-06-08 LAB — HEPATITIS C ANTIBODY: HCV Ab: NEGATIVE

## 2015-06-10 ENCOUNTER — Encounter: Payer: Self-pay | Admitting: Internal Medicine

## 2015-06-10 ENCOUNTER — Telehealth: Payer: Self-pay | Admitting: Internal Medicine

## 2015-06-10 MED ORDER — ERGOCALCIFEROL 1.25 MG (50000 UT) PO CAPS: 50000.0000 [IU] | ORAL_CAPSULE | ORAL | Status: DC

## 2015-06-10 NOTE — Telephone Encounter (Signed)
MyChart message sent re labs

## 2015-07-07 ENCOUNTER — Encounter: Payer: Self-pay | Admitting: Family Medicine

## 2015-07-07 ENCOUNTER — Other Ambulatory Visit (HOSPITAL_COMMUNITY)
Admission: RE | Admit: 2015-07-07 | Discharge: 2015-07-07 | Disposition: A | Discharge status: Home / Self Care | Payer: 59 | Source: Ambulatory Visit | Attending: Family Medicine | Admitting: Family Medicine

## 2015-07-07 ENCOUNTER — Ambulatory Visit: Payer: 59 | Admitting: Nurse Practitioner

## 2015-07-07 ENCOUNTER — Ambulatory Visit (INDEPENDENT_AMBULATORY_CARE_PROVIDER_SITE_OTHER): Payer: 59 | Admitting: Family Medicine

## 2015-07-07 VITALS — BP 124/82 | HR 69 | Temp 98.3°F | Ht 63.0 in | Wt 184.0 lb

## 2015-07-07 DIAGNOSIS — N949 Unspecified condition associated with female genital organs and menstrual cycle: Secondary | ICD-10-CM

## 2015-07-07 DIAGNOSIS — R300 Dysuria: Secondary | ICD-10-CM

## 2015-07-07 DIAGNOSIS — N898 Other specified noninflammatory disorders of vagina: Secondary | ICD-10-CM | POA: Diagnosis not present

## 2015-07-07 DIAGNOSIS — N9489 Other specified conditions associated with female genital organs and menstrual cycle: Secondary | ICD-10-CM

## 2015-07-07 DIAGNOSIS — Z113 Encounter for screening for infections with a predominantly sexual mode of transmission: Secondary | ICD-10-CM | POA: Insufficient documentation

## 2015-07-07 LAB — POCT URINALYSIS DIPSTICK
BILIRUBIN UA: NEGATIVE
Blood, UA: NEGATIVE
GLUCOSE UA: NEGATIVE
Ketones, UA: NEGATIVE
Leukocytes, UA: NEGATIVE
Nitrite, UA: NEGATIVE
Protein, UA: NEGATIVE
SPEC GRAV UA: 1.02
Urobilinogen, UA: 0.2
pH, UA: 6

## 2015-07-07 MED ORDER — FLUCONAZOLE 150 MG PO TABS: 150.0000 mg | ORAL_TABLET | ORAL | Status: DC

## 2015-07-07 NOTE — Assessment & Plan Note (Signed)
Patient with one episode of vaginal burning while sexually active. Vaginal exam does reveal small amount of thick white discharge. No cervical irritation. Otherwise benign pelvic exam. I discussed options for treatment. She was provided with Diflucan to take if her symptoms recur. We sent off gonorrhea chlamydia and wet prep. She'll continue to monitor. She is given return precautions.

## 2015-07-07 NOTE — Progress Notes (Signed)
Patient ID: Christina Stafford, female   DOB: 09/02/1970, 45 y.o.   MRN: [id number]  Tommi Rumps, MD Phone: [phone]  Christina Stafford is a 45 y.o. female who presents today for same-day visit.  Patient notes several days ago she had vaginal burning during intercourse. Notes no additional burning since then. She is unsure if she has any discharge. She notes 2 weeks ago she had her period and forgot a tampon in her vagina for 3 days. No fevers. No abdominal pain. No history of STDs. She is sexually active with only one partner. No dysuria. She has a history of yeast infections.  PMH: Has a history of yeast infections in the past   ROS see history of present illness  Objective  Physical Exam Filed Vitals:   07/07/15 0806  BP: 124/82  Pulse: 69  Temp: 98.3 F (36.8 C)    BP Readings from Last 3 Encounters:  07/07/15 124/82  05/29/15 138/76  02/08/15 118/76   Wt Readings from Last 3 Encounters:  07/07/15 184 lb (83.462 kg)  05/29/15 188 lb 4 oz (85.39 kg)  02/08/15 186 lb 9.6 oz (84.641 kg)    Physical Exam  Constitutional: She is well-developed, well-nourished, and in no distress.  Cardiovascular: Normal rate, regular rhythm and normal heart sounds.   Pulmonary/Chest: Effort normal and breath sounds normal.  Abdominal: Soft. Bowel sounds are normal. She exhibits no distension. There is no tenderness. There is no rebound and no guarding.  Neurological: She is alert. Gait normal.  Skin: Skin is warm and dry. She is not diaphoretic.     Assessment/Plan: Please see individual problem list.  Vaginal burning Patient with one episode of vaginal burning while sexually active. Vaginal exam does reveal small amount of thick white discharge. No cervical irritation. Otherwise benign pelvic exam. I discussed options for treatment. She was provided with Diflucan to take if her symptoms recur. We sent off gonorrhea chlamydia and wet prep. She'll continue to monitor. She  is given return precautions.    Orders Placed This Encounter  Procedures  . WET PREP BY MOLECULAR PROBE  . POCT Urinalysis Dipstick    Meds ordered this encounter  Medications  . fluconazole (DIFLUCAN) 150 MG tablet    Sig: Take 1 tablet (150 mg total) by mouth every 3 (three) days.    Dispense:  2 tablet    Refill:  0   Tommi Rumps, MD [place]

## 2015-07-07 NOTE — Patient Instructions (Signed)
Nice to meet you. We're going to send off vaginal swabs to evaluate for infection. If your symptoms persist while you're out of the country you can start the Diflucan. If you develop abdominal pain, fevers, burning with urination, or any new or changing symptoms please seek medical attention.

## 2015-07-07 NOTE — Progress Notes (Signed)
Pre visit review using our clinic review tool, if applicable. No additional management support is needed unless otherwise documented below in the visit note. 

## 2015-07-08 LAB — WET PREP BY MOLECULAR PROBE
Candida species: NEGATIVE
Gardnerella vaginalis: NEGATIVE
Trichomonas vaginosis: NEGATIVE

## 2015-07-10 LAB — CERVICOVAGINAL ANCILLARY ONLY
CHLAMYDIA, DNA PROBE: NEGATIVE
NEISSERIA GONORRHEA: NEGATIVE

## 2015-12-20 ENCOUNTER — Encounter: Payer: Self-pay | Admitting: Intensive Care

## 2015-12-20 ENCOUNTER — Emergency Department
Admission: EM | Admit: 2015-12-20 | Discharge: 2015-12-20 | Disposition: A | Discharge status: Home / Self Care | Payer: BLUE CROSS/BLUE SHIELD | Attending: Emergency Medicine | Admitting: Emergency Medicine

## 2015-12-20 ENCOUNTER — Emergency Department: Payer: BLUE CROSS/BLUE SHIELD

## 2015-12-20 DIAGNOSIS — Z79899 Other long term (current) drug therapy: Secondary | ICD-10-CM | POA: Insufficient documentation

## 2015-12-20 DIAGNOSIS — R0789 Other chest pain: Secondary | ICD-10-CM | POA: Insufficient documentation

## 2015-12-20 DIAGNOSIS — Z87891 Personal history of nicotine dependence: Secondary | ICD-10-CM | POA: Diagnosis not present

## 2015-12-20 DIAGNOSIS — I10 Essential (primary) hypertension: Secondary | ICD-10-CM | POA: Insufficient documentation

## 2015-12-20 DIAGNOSIS — R079 Chest pain, unspecified: Secondary | ICD-10-CM

## 2015-12-20 LAB — BASIC METABOLIC PANEL
Anion gap: 8 (ref 5–15)
BUN: 15 mg/dL (ref 6–20)
CO2: 26 mmol/L (ref 22–32)
CREATININE: 0.9 mg/dL (ref 0.44–1.00)
Calcium: 9.7 mg/dL (ref 8.9–10.3)
Chloride: 104 mmol/L (ref 101–111)
GFR calc Af Amer: >60 mL/min (ref >60)
GLUCOSE: 120 mg/dL — AB (ref 65–99)
Potassium: 3.5 mmol/L (ref 3.5–5.1)
SODIUM: 138 mmol/L (ref 135–145)

## 2015-12-20 LAB — TROPONIN I: Troponin I: <0.03 ng/mL (ref <0.03)

## 2015-12-20 LAB — CBC
HCT: 44 % (ref 35.0–47.0)
Hemoglobin: 15.1 g/dL (ref 12.0–16.0)
MCH: 31.5 pg (ref 26.0–34.0)
MCHC: 34.3 g/dL (ref 32.0–36.0)
MCV: 92 fL (ref 80.0–100.0)
PLATELETS: 334 10*3/uL (ref 150–440)
RBC: 4.78 MIL/uL (ref 3.80–5.20)
RDW: 13 % (ref 11.5–14.5)
WBC: 7.7 10*3/uL (ref 3.6–11.0)

## 2015-12-20 NOTE — Discharge Instructions (Signed)
Please seek medical attention for any high fevers, chest pain, shortness of breath, change in behavior, persistent vomiting, bloody stool or any other new or concerning symptoms.  

## 2015-12-20 NOTE — ED Triage Notes (Signed)
Patient reports she started feeling dull chest pain on Saturday. The pain has continually gotten worse. Patient reports she feel constant, dull pain at rest or activity. C/o chest pain at this time. Pt reports nausea and dizziness. Denies SOB

## 2015-12-20 NOTE — ED Provider Notes (Signed)
[facility] Emergency Department Provider Note    ____________________________________________   I have reviewed the triage vital signs and the nursing notes.   HISTORY  Chief Complaint Chest Pain   History limited by: Not Limited   HPI Christina Stafford is a 45 y.o. female who presents to the emergency department today because of concerns for chest pain. It is located in her center chest. It started 4 days ago. It has been constant. It is pressure-like. The patient has not had any shortness of breath or cough with it. No recent fevers. Patient does state that she was under a lot of stress at work. She does have medication for anxiety and stress and states that she has taken it the past few days and that has provided some relief. Patient has appointment with primary care scheduled for next week.   Past Medical History:  Diagnosis Date  . Carpal tunnel syndrome on both sides   . Depression   . GERD (gastroesophageal reflux disease)   . Hyperlipidemia   . Hypertension   . Obesity   . Sleep apnea 2011   using CPAP 6 cm H20  [facility] Sleep Study    Patient Active Problem List   Diagnosis Date Noted  . Vaginal burning 07/07/2015  . Encounter for preventive health examination 05/31/2015  . Heart murmur 11/13/2014  . Irregular menstrual bleeding 03/07/2014  . PMS (premenstrual syndrome) 07/06/2013  . GERD (gastroesophageal reflux disease) 12/01/2012  . Palpitations 11/06/2012  . Panic attacks 11/04/2012  . History of tobacco abuse 03/26/2011  . Screening for cervical cancer 03/26/2011  . OSA on CPAP   . Carpal tunnel syndrome on both sides   . Depression   . Hyperlipidemia   . Hypertension   . Obesity     Past Surgical History:  Procedure Laterality Date  . BREAST SURGERY  2003   negative biopsy  . CHOLECYSTECTOMY  1995  . TONSILLECTOMY AND ADENOIDECTOMY  2003    Prior to Admission medications   Medication Sig Start Date End Date Taking?  Authorizing Provider  ALPRAZolam Duanne Moron) 0.5 MG tablet Take 1 tablet (0.5 mg total) by mouth 2 (two) times daily as needed for sleep or anxiety. 05/29/15   Crecencio Mc, MD  busPIRone (BUSPAR) 5 MG tablet Take 1 tablet (5 mg total) by mouth 3 (three) times daily. 05/29/15   Crecencio Mc, MD  cetirizine (ZYRTEC) 10 MG tablet Take one by mouth daily when needed.     Historical Provider, MD  ergocalciferol (DRISDOL) 50000 units capsule Take 1 capsule (50,000 Units total) by mouth once a week. 06/10/15   Crecencio Mc, MD  esomeprazole (NEXIUM) 20 MG capsule Take 20 mg by mouth daily at 12 noon.    Historical Provider, MD  fluconazole (DIFLUCAN) 150 MG tablet Take 1 tablet (150 mg total) by mouth every 3 (three) days. 07/07/15   Leone Haven, MD  fluticasone (FLONASE) 50 MCG/ACT nasal spray Place 2 sprays into both nostrils daily. 02/04/13   Raquel Dagoberto Ligas, NP  levofloxacin (LEVAQUIN) 500 MG tablet Take 1 tablet (500 mg total) by mouth daily. 05/29/15   Crecencio Mc, MD  medroxyPROGESTERone (PROVERA) 5 MG tablet Take 1 tablet (5 mg total) by mouth daily. 05/29/15   Crecencio Mc, MD  omeprazole (PRILOSEC OTC) 20 MG tablet Take 20 mg by mouth daily.    Historical Provider, MD  predniSONE (DELTASONE) 10 MG tablet 6 tablets on Day 1 , then  reduce by 1 tablet daily until gone 05/29/15   Crecencio Mc, MD  scopolamine (TRANSDERM-SCOP, 1.5 MG,) 1 MG/3DAYS Place 1 patch (1.5 mg total) onto the skin every 3 (three) days. 05/29/15   Crecencio Mc, MD  spironolactone (ALDACTONE) 25 MG tablet Take 1 tablet (25 mg total) by mouth daily. As needed  For fluid retention 05/29/15   Crecencio Mc, MD    Allergies Review of patient's allergies indicates no known allergies.  Family History  Problem Relation Age of Onset  . Hypertension Mother   . Diabetes Mother   . Hyperlipidemia Father     Social History Social History  Substance Use Topics  . Smoking status: Former Smoker    Start date: 05/17/2011  .  Smokeless tobacco: Never Used  . Alcohol use 1.8 oz/week    3 Glasses of wine per week    Review of Systems  Constitutional: Negative for fever. Cardiovascular: Positive for chest pain. Respiratory: Negative for shortness of breath. Gastrointestinal: Negative for abdominal pain, vomiting and diarrhea. Neurological: Negative for headaches, focal weakness or numbness.  10-point ROS otherwise negative.  ____________________________________________   PHYSICAL EXAM:  VITAL SIGNS: ED Triage Vitals  Enc Vitals Group     BP 12/20/15 1857 (!) 150/95     Pulse Rate 12/20/15 1857 77     Resp 12/20/15 1857 18     Temp 12/20/15 1857 98.2 F (36.8 C)     Temp Source 12/20/15 1857 Oral     SpO2 12/20/15 1857 95 %     Weight 12/20/15 1853 192 lb (87.1 kg)     Height 12/20/15 1853 5' 3"  (1.6 m)     Head Circumference --      Peak Flow --      Pain Score 12/20/15 1854 5   Constitutional: Alert and oriented. Well appearing and in no distress. Eyes: Conjunctivae are normal. Normal extraocular movements. ENT   Head: Normocephalic and atraumatic.   Nose: No congestion/rhinnorhea.   Mouth/Throat: Mucous membranes are moist.   Neck: No stridor. Hematological/Lymphatic/Immunilogical: No cervical lymphadenopathy. Cardiovascular: Normal rate, regular rhythm.  No murmurs, rubs, or gallops. Respiratory: Normal respiratory effort without tachypnea nor retractions. Breath sounds are clear and equal bilaterally. No wheezes/rales/rhonchi. Gastrointestinal: Soft and nontender. No distention.  Genitourinary: Deferred Musculoskeletal: Normal range of motion in all extremities. No lower extremity edema. Neurologic:  Normal speech and language. No gross focal neurologic deficits are appreciated.  Skin:  Skin is warm, dry and intact. No rash noted. Psychiatric: Mood and affect are normal. Speech and behavior are normal. Patient exhibits appropriate insight and  judgment.  ____________________________________________    LABS (pertinent positives/negatives)  Labs Reviewed  BASIC METABOLIC PANEL - Abnormal; Notable for the following:       Result Value   Glucose, Bld 120 (*)    All other components within normal limits  CBC  TROPONIN I     ____________________________________________   EKG  I, Nance Pear, attending physician, personally viewed and interpreted this EKG  EKG Time: 1854 Rate: 82 Rhythm: normal sinus rhythm Axis: normal Intervals: qtc 467 QRS: narrow ST changes: no st elevation Impression: normal ekg   ____________________________________________    RADIOLOGY  CXR IMPRESSION:  1. No acute findings.  2. Mild thoracic spondylosis.     ____________________________________________   PROCEDURES  Procedures  ____________________________________________   INITIAL IMPRESSION / ASSESSMENT AND PLAN / ED COURSE  Pertinent labs & imaging results that were available during my care of  the patient were reviewed by me and considered in my medical decision making (see chart for details).  Patient presented to the emergency department today because of concerns for chest pain. Has been going on for a few days. Troponin was negative. I would expect some elevation of the troponin if the pain represented ACS at this point. Additionally chest x-ray and there is some blood work without concerning findings. I do think stress could be playing a role in the patient's symptoms. Feel she is safe for discharge at this point. She does have follow-up appointment already scheduled next week. Did discuss return precautions. ____________________________________________   FINAL CLINICAL IMPRESSION(S) / ED DIAGNOSES  Final diagnoses:  Nonspecific chest pain     Note: This dictation was prepared with Dragon dictation. Any transcriptional errors that result from this process are unintentional    Nance Pear,  MD 12/20/15 2041

## 2015-12-28 ENCOUNTER — Ambulatory Visit (INDEPENDENT_AMBULATORY_CARE_PROVIDER_SITE_OTHER): Payer: BLUE CROSS/BLUE SHIELD | Admitting: Internal Medicine

## 2015-12-28 DIAGNOSIS — F410 Panic disorder [episodic paroxysmal anxiety] without agoraphobia: Secondary | ICD-10-CM

## 2015-12-28 DIAGNOSIS — Z23 Encounter for immunization: Secondary | ICD-10-CM | POA: Diagnosis not present

## 2015-12-28 DIAGNOSIS — F411 Generalized anxiety disorder: Secondary | ICD-10-CM

## 2015-12-28 DIAGNOSIS — I10 Essential (primary) hypertension: Secondary | ICD-10-CM

## 2015-12-28 MED ORDER — METOPROLOL SUCCINATE ER 25 MG PO TB24: 25.0000 mg | ORAL_TABLET | Freq: Every day | ORAL | 0 refills | Status: DC

## 2015-12-28 MED ORDER — ALPRAZOLAM 0.5 MG PO TABS: 0.5000 mg | ORAL_TABLET | Freq: Every day | ORAL | 2 refills | Status: DC | PRN

## 2015-12-28 MED ORDER — SERTRALINE HCL 50 MG PO TABS
50.0000 mg | ORAL_TABLET | Freq: Every day | ORAL | 0 refills | Status: DC
Start: 2015-12-28 — End: 2016-02-02

## 2015-12-28 NOTE — Patient Instructions (Addendum)
I am starting you on Toprol XL (metoprolol succinate ). If BP is not 130/80 after one week,  Let me know and we will either adjust dose or add something to it.  Need to know your pulse as well.    For your anxiety   Starting zoloft at 25 mg daily for first few days,  Then increase to 50 mg daiy,  Take either at bedtime or after dinner   RTC or e mail me in 30 days    Here are the names of several well respected female therapists   Lennon Alstrom    [phone] [place]   [phone] [place] [phone]  Achilles Dunk  [phone]  Altha Harm

## 2015-12-28 NOTE — Progress Notes (Signed)
Subjective:  Patient ID: Christina Stafford, female    DOB: [birth year]  Age: 45 y.o. MRN: [id number]  CC: Diagnoses of Encounter for immunization, Panic attacks, Essential hypertension, and Generalized anxiety disorder were pertinent to this visit.  HPI Christina Stafford presents for ER follow up.  She presented to the ER on  Oct 4th with a 4 day history of atypical chest pain that was intermittently relieved with use of alprazolam. She was ruled out for AMI with cardiac enzymes and EKG and was screened with a normal 2 view chest x ray . She had a annual preventive health exam by her cardiologist , by Nehemiah Massed on Sept 14. Her 10 yr cardiac risk was estimated at 1.4% and compliance with use of CPAP for OSA  at 100%  .    She is tearful today.  Her anxiety has been "out of control" lately due to work stressors States that she wakes up alert,  Starts her usual morning  Routine,  then starts dreading the day  And going to work.  Her Home CPAP machine has not been working so has been using her travel machine since June.   Discussed trial of zoloft and toprol xl given concurrent hypertension and possibility of panic attacks.    Home diastolic readings are persistently  < 90 ,    During her last episode of chest pain she had developed new onset tingling right leg. But has also had episodes of tingling in the left.  Sometimes occurring in  in both arms.  Starting researching her symptoms on the internet and the possibility of MS came up,    Outpatient Medications Prior to Visit  Medication Sig Dispense Refill  . busPIRone (BUSPAR) 5 MG tablet Take 1 tablet (5 mg total) by mouth 3 (three) times daily. 90 tablet 1  . cetirizine (ZYRTEC) 10 MG tablet Take one by mouth daily when needed.     Marland Kitchen esomeprazole (NEXIUM) 20 MG capsule Take 20 mg by mouth daily at 12 noon.    Marland Kitchen ALPRAZolam (XANAX) 0.5 MG tablet Take 1 tablet (0.5 mg total) by mouth 2 (two) times daily as needed for sleep or anxiety. 30  tablet 2  . levofloxacin (LEVAQUIN) 500 MG tablet Take 1 tablet (500 mg total) by mouth daily. (Patient not taking: Reported on 12/28/2015) 7 tablet 0  . medroxyPROGESTERone (PROVERA) 5 MG tablet Take 1 tablet (5 mg total) by mouth daily. (Patient not taking: Reported on 12/28/2015) 10 tablet 0  . omeprazole (PRILOSEC OTC) 20 MG tablet Take 20 mg by mouth daily.    Marland Kitchen spironolactone (ALDACTONE) 25 MG tablet Take 1 tablet (25 mg total) by mouth daily. As needed  For fluid retention (Patient not taking: Reported on 12/28/2015) 30 tablet 0  . ergocalciferol (DRISDOL) 50000 units capsule Take 1 capsule (50,000 Units total) by mouth once a week. (Patient not taking: Reported on 12/28/2015) 12 capsule 0  . fluconazole (DIFLUCAN) 150 MG tablet Take 1 tablet (150 mg total) by mouth every 3 (three) days. (Patient not taking: Reported on 12/28/2015) 2 tablet 0  . fluticasone (FLONASE) 50 MCG/ACT nasal spray Place 2 sprays into both nostrils daily. (Patient not taking: Reported on 12/28/2015) 16 g 6  . predniSONE (DELTASONE) 10 MG tablet 6 tablets on Day 1 , then reduce by 1 tablet daily until gone (Patient not taking: Reported on 12/28/2015) 21 tablet 0  . scopolamine (TRANSDERM-SCOP, 1.5 MG,) 1 MG/3DAYS Place 1 patch (1.5 mg total) onto  the skin every 3 (three) days. (Patient not taking: Reported on 12/28/2015) 4 patch 0   No facility-administered medications prior to visit.     Review of Systems;  Patient denies headache, fevers, malaise, unintentional weight loss, skin rash, eye pain, sinus congestion and sinus pain, sore throat, dysphagia,  hemoptysis , cough, dyspnea, wheezing,, palpitations, orthopnea, edema, abdominal pain, nausea, melena, diarrhea, constipation, flank pain, dysuria, hematuria, urinary  Frequency, nocturia, numbness, tingling, seizures,  Focal weakness, Loss of consciousness,  Tremor, i, depression, and suicidal ideation.      Objective:  BP (!) 148/90   Pulse 92   Temp 98.5 F  (36.9 C) (Oral)   Resp 12   Ht 5' 3"  (1.6 m)   Wt 195 lb 8 oz (88.7 kg)   LMP 12/11/2015   SpO2 98%   BMI 34.63 kg/m   BP Readings from Last 3 Encounters:  12/28/15 (!) 148/90  12/20/15 (!) 148/101  07/07/15 124/82    Wt Readings from Last 3 Encounters:  12/28/15 195 lb 8 oz (88.7 kg)  12/20/15 192 lb (87.1 kg)  07/07/15 184 lb (83.5 kg)    General appearance: alert, cooperative and appears stated age Ears: normal TM's and external ear canals both ears Throat: lips, mucosa, and tongue normal; teeth and gums normal Neck: no adenopathy, no carotid bruit, supple, symmetrical, trachea midline and thyroid not enlarged, symmetric, no tenderness/mass/nodules Back: symmetric, no curvature. ROM normal. No CVA tenderness. Lungs: clear to auscultation bilaterally Heart: regular rate and rhythm, S1, S2 normal, no murmur, click, rub or gallop Abdomen: soft, non-tender; bowel sounds normal; no masses,  no organomegaly Pulses: 2+ and symmetric Skin: Skin color, texture, turgor normal. No rashes or lesions Lymph nodes: Cervical, supraclavicular, and axillary nodes normal.  Lab Results  Component Value Date   HGBA1C 5.3 10/12/2014    Lab Results  Component Value Date   CREATININE 0.90 12/20/2015   CREATININE 0.84 06/07/2015   CREATININE 0.77 10/11/2014    Lab Results  Component Value Date   WBC 7.7 12/20/2015   HGB 15.1 12/20/2015   HCT 44.0 12/20/2015   PLT 334 12/20/2015   GLUCOSE 120 (H) 12/20/2015   CHOL 327 (H) 06/07/2015   TRIG 162.0 (H) 06/07/2015   HDL 64.20 06/07/2015   LDLDIRECT 176.5 05/21/2012   LDLCALC 230 (H) 06/07/2015   ALT 19 06/07/2015   AST 22 06/07/2015   NA 138 12/20/2015   K 3.5 12/20/2015   CL 104 12/20/2015   CREATININE 0.90 12/20/2015   BUN 15 12/20/2015   CO2 26 12/20/2015   TSH 2.89 06/07/2015   HGBA1C 5.3 10/12/2014   MICROALBUR 0.6 03/26/2011    Dg Chest 2 View  Result Date: 12/20/2015 CLINICAL DATA:  Dull chest pain starting  Saturday. EXAM: CHEST  2 VIEW COMPARISON:  10/11/2014 FINDINGS: The lungs appear clear.  Cardiac and mediastinal contours normal. No pleural effusion identified. Mild thoracic spondylosis. IMPRESSION: 1. No acute findings. 2. Mild thoracic spondylosis. Electronically Signed   By: Van Clines M.D.   On: 12/20/2015 19:42    Assessment & Plan:   Problem List Items Addressed This Visit    Hypertension    Starting  Toprol XL       Relevant Medications   metoprolol succinate (TOPROL-XL) 25 MG 24 hr tablet   Panic attacks    Chronic recurrent., similar ER visit for chest pain last year.  May be aggravated by use of travel CPAP.  Adding zoloft and toprol XL  Relevant Medications   sertraline (ZOLOFT) 50 MG tablet   ALPRAZolam (XANAX) 0.5 MG tablet   Generalized anxiety disorder    With somatic complaints resulting in ER visit for atypical chest pain.  Starting Zoloft   Encouraged to consider psychotherapy.  Names of pschologists and contact info given. RTC one week       Other Visit Diagnoses    Encounter for immunization       Relevant Orders   Flu Vaccine QUAD 36+ mos IM (Completed)      I have discontinued Ms. Stukes's fluticasone, predniSONE, scopolamine, ergocalciferol, and fluconazole. I have also changed her ALPRAZolam. Additionally, I am having her start on metoprolol succinate and sertraline. Lastly, I am having her maintain her cetirizine, omeprazole, esomeprazole, medroxyPROGESTERone, spironolactone, levofloxacin, and busPIRone.  Meds ordered this encounter  Medications  . metoprolol succinate (TOPROL-XL) 25 MG 24 hr tablet    Sig: Take 1 tablet (25 mg total) by mouth daily.    Dispense:  90 tablet    Refill:  0  . sertraline (ZOLOFT) 50 MG tablet    Sig: Take 1 tablet (50 mg total) by mouth daily.    Dispense:  90 tablet    Refill:  0  . ALPRAZolam (XANAX) 0.5 MG tablet    Sig: Take 1 tablet (0.5 mg total) by mouth daily as needed for anxiety (or panic  attacks).    Dispense:  30 tablet    Refill:  2   A total of 40 minutes of face to face time was spent with patient more than half of which was spent in reviewed recent ER records, counselling and coordination of care   Medications Discontinued During This Encounter  Medication Reason  . ALPRAZolam (XANAX) 0.5 MG tablet Reorder  . ergocalciferol (DRISDOL) 50000 units capsule   . fluticasone (FLONASE) 50 MCG/ACT nasal spray   . predniSONE (DELTASONE) 10 MG tablet   . scopolamine (TRANSDERM-SCOP, 1.5 MG,) 1 MG/3DAYS   . fluconazole (DIFLUCAN) 150 MG tablet     Follow-up: No Follow-up on file.   Crecencio Mc, MD

## 2015-12-28 NOTE — Progress Notes (Signed)
Pre-visit discussion using our clinic review tool. No additional management support is needed unless otherwise documented below in the visit note.  

## 2015-12-31 DIAGNOSIS — F411 Generalized anxiety disorder: Secondary | ICD-10-CM | POA: Insufficient documentation

## 2015-12-31 NOTE — Assessment & Plan Note (Signed)
Chronic recurrent., similar ER visit for chest pain last year.  May be aggravated by use of travel CPAP.  Adding zoloft and toprol XL

## 2015-12-31 NOTE — Assessment & Plan Note (Signed)
With somatic complaints resulting in ER visit for atypical chest pain.  Starting Zoloft   Encouraged to consider psychotherapy.  Names of pschologists and contact info given. RTC one week

## 2015-12-31 NOTE — Assessment & Plan Note (Signed)
Starting  Toprol XL

## 2016-01-11 ENCOUNTER — Encounter: Payer: Self-pay | Admitting: Internal Medicine

## 2016-01-11 ENCOUNTER — Other Ambulatory Visit: Payer: Self-pay | Admitting: Internal Medicine

## 2016-01-11 MED ORDER — AMLODIPINE BESYLATE 2.5 MG PO TABS: 2.5000 mg | ORAL_TABLET | Freq: Every day | ORAL | 3 refills | Status: DC

## 2016-01-11 NOTE — Progress Notes (Signed)
amlodipine

## 2016-02-01 ENCOUNTER — Encounter: Payer: Self-pay | Admitting: Internal Medicine

## 2016-02-02 ENCOUNTER — Other Ambulatory Visit: Payer: Self-pay | Admitting: Internal Medicine

## 2016-02-02 MED ORDER — SERTRALINE HCL 50 MG PO TABS: 75.0000 mg | ORAL_TABLET | Freq: Every day | ORAL | 0 refills | Status: DC

## 2016-02-12 IMAGING — CR DG CHEST 2V
2 series · 2 of 2 positions shown · non-contrast
Comparison: August 12, 2008

CLINICAL DATA: Shortness of breath and chest pain for 4 days

EXAM:
CHEST  2 VIEW

[chest pa]
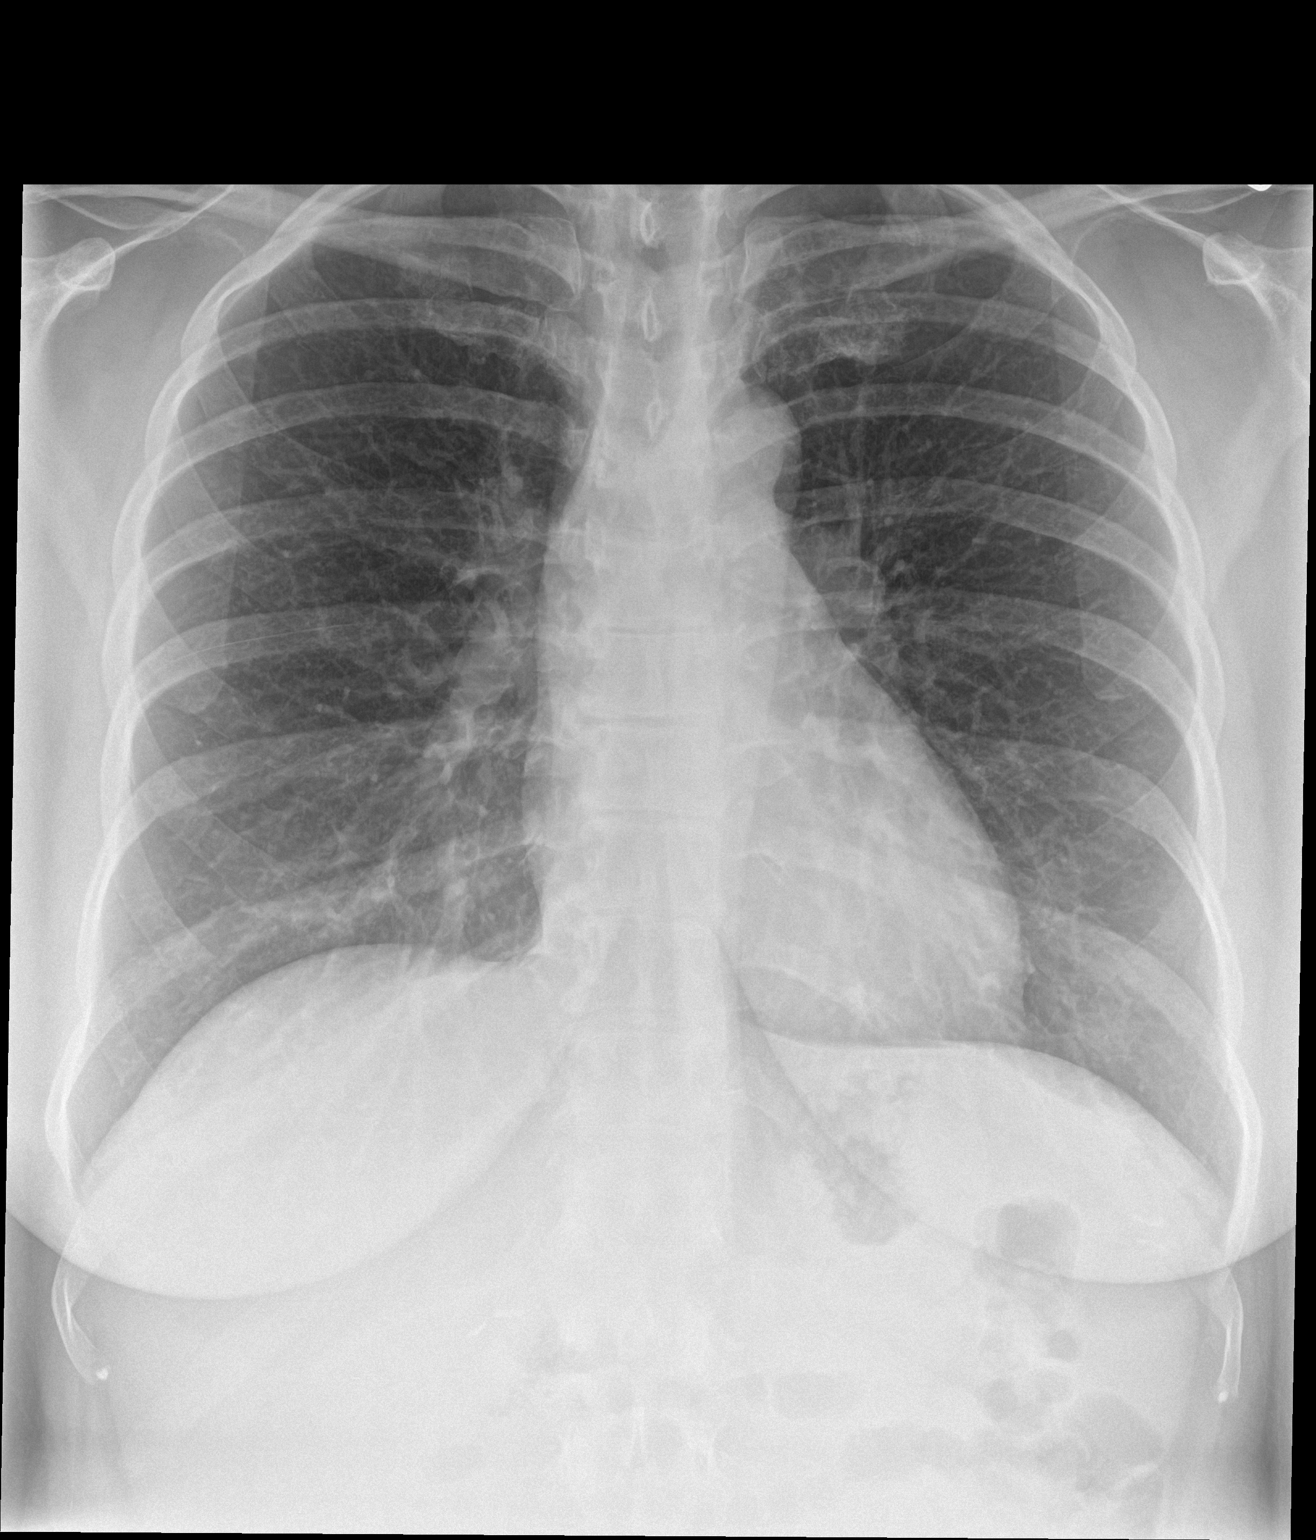

[chest lat]
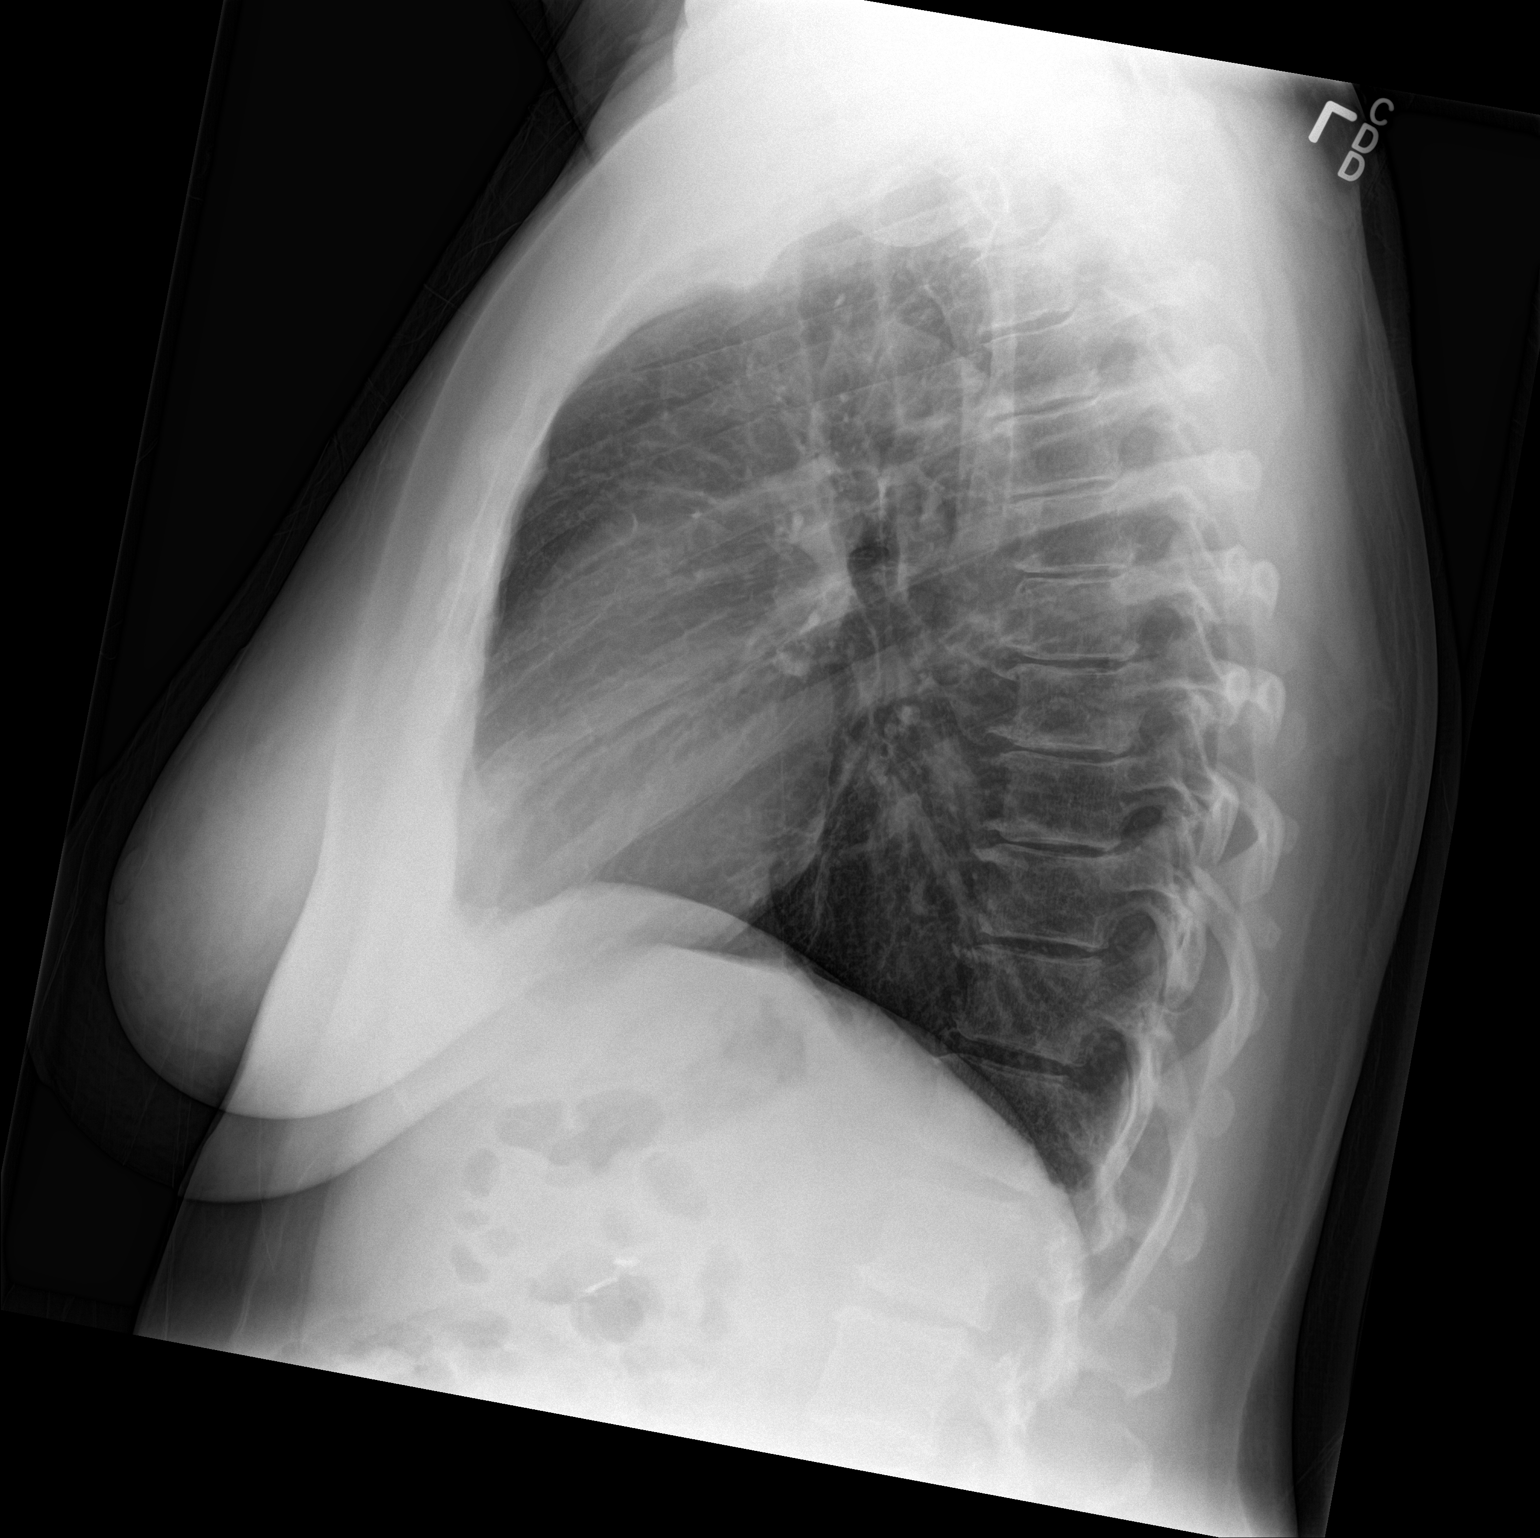

[2 of 2 positions shown; findings below may reference images not displayed]

FINDINGS: Lungs are clear. Heart size and pulmonary vascularity are normal. No
pneumothorax. No adenopathy. No bone lesions. There is degenerative
change in the thoracic spine.
IMPRESSION: No edema or consolidation.

## 2016-03-20 ENCOUNTER — Other Ambulatory Visit: Payer: Self-pay | Admitting: Internal Medicine

## 2016-03-24 ENCOUNTER — Other Ambulatory Visit: Payer: Self-pay | Admitting: Internal Medicine

## 2016-04-02 ENCOUNTER — Telehealth: Payer: BLUE CROSS/BLUE SHIELD | Admitting: Family

## 2016-04-02 DIAGNOSIS — R6889 Other general symptoms and signs: Secondary | ICD-10-CM

## 2016-04-02 MED ORDER — OSELTAMIVIR PHOSPHATE 75 MG PO CAPS: 75.0000 mg | ORAL_CAPSULE | Freq: Two times a day (BID) | ORAL | 0 refills | Status: DC

## 2016-04-02 NOTE — Progress Notes (Signed)

## 2016-04-07 ENCOUNTER — Telehealth: Payer: BLUE CROSS/BLUE SHIELD | Admitting: Family

## 2016-04-07 DIAGNOSIS — J069 Acute upper respiratory infection, unspecified: Secondary | ICD-10-CM

## 2016-04-07 DIAGNOSIS — B9789 Other viral agents as the cause of diseases classified elsewhere: Secondary | ICD-10-CM

## 2016-04-07 MED ORDER — PREDNISONE 10 MG (21) PO TBPK: ORAL_TABLET | ORAL | 0 refills | Status: DC

## 2016-04-07 MED ORDER — ALBUTEROL SULFATE HFA 108 (90 BASE) MCG/ACT IN AERS: 2.0000 | INHALATION_SPRAY | Freq: Four times a day (QID) | RESPIRATORY_TRACT | 0 refills | Status: DC | PRN

## 2016-04-07 MED ORDER — BENZONATATE 100 MG PO CAPS: 100.0000 mg | ORAL_CAPSULE | Freq: Three times a day (TID) | ORAL | 0 refills | Status: DC | PRN

## 2016-04-07 NOTE — Progress Notes (Signed)
We are sorry that you are not feeling well.  Here is how we plan to help!  Based on what you have shared with me it looks like you have upper respiratory tract inflammation that has resulted in a significant cough.  Inflammation and infection in the upper respiratory tract is commonly called bronchitis and has four common causes:  Allergies, Viral Infections, Acid Reflux and Bacterial Infections.  Allergies, viruses and acid reflux are treated by controlling symptoms or eliminating the cause. An example might be a cough caused by taking certain blood pressure medications. You stop the cough by changing the medication. Another example might be a cough caused by acid reflux. Controlling the reflux helps control the cough.  Based on your presentation I believe you most likely have A cough due to a virus.  This is called viral bronchitis and is best treated by rest, plenty of fluids and control of the cough.  You may use Ibuprofen or Tylenol as directed to help your symptoms.     In addition you may use A non-prescription cough medication called Robitussin DAC. Take 2 teaspoons every 8 hours or Delsym: take 2 teaspoons every 12 hours., A non-prescription cough medication called Mucinex DM: take 2 tablets every 12 hours. and A prescription cough medication called Tessalon Perles 129m. You may take 1-2 capsules every 8 hours as needed for your cough.  Sterapred 10 mg dosepak, also a albuterol inhaler to use every 6 hours as needed for wheezing or shortness of breathe.   USE OF BRONCHODILATOR ("RESCUE") INHALERS: There is a risk from using your bronchodilator too frequently.  The risk is that over-reliance on a medication which only relaxes the muscles surrounding the breathing tubes can reduce the effectiveness of medications prescribed to reduce swelling and congestion of the tubes themselves.  Although you feel brief relief from the bronchodilator inhaler, your asthma may actually be worsening with the tubes  becoming more swollen and filled with mucus.  This can delay other crucial treatments, such as oral steroid medications. If you need to use a bronchodilator inhaler daily, several times per day, you should discuss this with your provider.  There are probably better treatments that could be used to keep your asthma under control.     HOME CARE . Only take medications as instructed by your medical team. . Complete the entire course of an antibiotic. . Drink plenty of fluids and get plenty of rest. . Avoid close contacts especially the very young and the elderly . Cover your mouth if you cough or cough into your sleeve. . Always remember to wash your hands . A steam or ultrasonic humidifier can help congestion.   GET HELP RIGHT AWAY IF: . You develop worsening fever. . You become short of breath . You cough up blood. . Your symptoms persist after you have completed your treatment plan MAKE SURE YOU   Understand these instructions.  Will watch your condition.  Will get help right away if you are not doing well or get worse.  Your e-visit answers were reviewed by a board certified advanced clinical practitioner to complete your personal care plan.  Depending on the condition, your plan could have included both over the counter or prescription medications. If there is a problem please reply  once you have received a response from your provider. Your safety is important to [place]  If you have drug allergies check your prescription carefully.    You can use MyChart to ask questions about  today's visit, request a non-urgent call back, or ask for a work or school excuse for 24 hours related to this e-Visit. If it has been greater than 24 hours you will need to follow up with your provider, or enter a new e-Visit to address those concerns. You will get an e-mail in the next two days asking about your experience.  I hope that your e-visit has been valuable and will speed your recovery. Thank you for  using e-visits.

## 2016-04-29 ENCOUNTER — Telehealth: Payer: BLUE CROSS/BLUE SHIELD | Admitting: Family

## 2016-04-29 DIAGNOSIS — B9689 Other specified bacterial agents as the cause of diseases classified elsewhere: Secondary | ICD-10-CM

## 2016-04-29 DIAGNOSIS — J028 Acute pharyngitis due to other specified organisms: Secondary | ICD-10-CM

## 2016-04-29 MED ORDER — BENZONATATE 100 MG PO CAPS: 100.0000 mg | ORAL_CAPSULE | Freq: Three times a day (TID) | ORAL | 0 refills | Status: DC | PRN

## 2016-04-29 MED ORDER — AZITHROMYCIN 250 MG PO TABS: ORAL_TABLET | ORAL | 0 refills | Status: DC

## 2016-04-29 NOTE — Progress Notes (Signed)
We are sorry that you are not feeling well.  Here is how we plan to help!  Based on what you have shared with me it looks like you have upper respiratory tract inflammation that has resulted in a significant cough.  Inflammation and infection in the upper respiratory tract is commonly called bronchitis and has four common causes:  Allergies, Viral Infections, Acid Reflux and Bacterial Infections.  Allergies, viruses and acid reflux are treated by controlling symptoms or eliminating the cause. An example might be a cough caused by taking certain blood pressure medications. You stop the cough by changing the medication. Another example might be a cough caused by acid reflux. Controlling the reflux helps control the cough.  Based on your presentation I believe you most likely have A cough due to bacteria.  When patients have a fever and a productive cough with a change in color or increased sputum production, we are concerned about bacterial bronchitis.  If left untreated it can progress to pneumonia.  If your symptoms do not improve with your treatment plan it is important that you contact your provider.   I have prescribed Azithromyin 250 mg: two tables now and then one tablet daily for 4 additonal days    In addition you may use A non-prescription cough medication called Mucinex DM: take 2 tablets every 12 hours. and A prescription cough medication called Tessalon Perles 160m. You may take 1-2 capsules every 8 hours as needed for your cough.   USE OF BRONCHODILATOR ("RESCUE") INHALERS: There is a risk from using your bronchodilator too frequently.  The risk is that over-reliance on a medication which only relaxes the muscles surrounding the breathing tubes can reduce the effectiveness of medications prescribed to reduce swelling and congestion of the tubes themselves.  Although you feel brief relief from the bronchodilator inhaler, your asthma may actually be worsening with the tubes becoming more swollen  and filled with mucus.  This can delay other crucial treatments, such as oral steroid medications. If you need to use a bronchodilator inhaler daily, several times per day, you should discuss this with your provider.  There are probably better treatments that could be used to keep your asthma under control.     HOME CARE . Only take medications as instructed by your medical team. . Complete the entire course of an antibiotic. . Drink plenty of fluids and get plenty of rest. . Avoid close contacts especially the very young and the elderly . Cover your mouth if you cough or cough into your sleeve. . Always remember to wash your hands . A steam or ultrasonic humidifier can help congestion.   GET HELP RIGHT AWAY IF: . You develop worsening fever. . You become short of breath . You cough up blood. . Your symptoms persist after you have completed your treatment plan MAKE SURE YOU   Understand these instructions.  Will watch your condition.  Will get help right away if you are not doing well or get worse.  Your e-visit answers were reviewed by a board certified advanced clinical practitioner to complete your personal care plan.  Depending on the condition, your plan could have included both over the counter or prescription medications. If there is a problem please reply  once you have received a response from your provider. Your safety is important to [place]  If you have drug allergies check your prescription carefully.    You can use MyChart to ask questions about today's visit, request a non-urgent  call back, or ask for a work or school excuse for 24 hours related to this e-Visit. If it has been greater than 24 hours you will need to follow up with your provider, or enter a new e-Visit to address those concerns. You will get an e-mail in the next two days asking about your experience.  I hope that your e-visit has been valuable and will speed your recovery. Thank you for using e-visits.

## 2016-06-23 ENCOUNTER — Other Ambulatory Visit: Payer: Self-pay | Admitting: Internal Medicine

## 2016-07-08 ENCOUNTER — Telehealth: Payer: BLUE CROSS/BLUE SHIELD | Admitting: Nurse Practitioner

## 2016-07-08 DIAGNOSIS — J0100 Acute maxillary sinusitis, unspecified: Secondary | ICD-10-CM

## 2016-07-08 MED ORDER — AMOXICILLIN-POT CLAVULANATE 875-125 MG PO TABS: 1.0000 | ORAL_TABLET | Freq: Two times a day (BID) | ORAL | 0 refills | Status: DC

## 2016-07-08 NOTE — Progress Notes (Signed)

## 2016-07-25 ENCOUNTER — Other Ambulatory Visit: Payer: Self-pay | Admitting: Internal Medicine

## 2016-10-15 ENCOUNTER — Encounter: Payer: Self-pay | Admitting: Internal Medicine

## 2016-10-15 ENCOUNTER — Ambulatory Visit (INDEPENDENT_AMBULATORY_CARE_PROVIDER_SITE_OTHER): Payer: BLUE CROSS/BLUE SHIELD | Admitting: Internal Medicine

## 2016-10-15 VITALS — BP 132/88 | HR 73 | Temp 98.0°F | Resp 15 | Ht 63.0 in | Wt 208.0 lb

## 2016-10-15 DIAGNOSIS — F411 Generalized anxiety disorder: Secondary | ICD-10-CM

## 2016-10-15 DIAGNOSIS — M545 Low back pain: Secondary | ICD-10-CM

## 2016-10-15 DIAGNOSIS — N924 Excessive bleeding in the premenopausal period: Secondary | ICD-10-CM

## 2016-10-15 DIAGNOSIS — R102 Pelvic and perineal pain: Secondary | ICD-10-CM | POA: Diagnosis not present

## 2016-10-15 DIAGNOSIS — M546 Pain in thoracic spine: Secondary | ICD-10-CM | POA: Diagnosis not present

## 2016-10-15 DIAGNOSIS — G8929 Other chronic pain: Secondary | ICD-10-CM | POA: Diagnosis not present

## 2016-10-15 DIAGNOSIS — G629 Polyneuropathy, unspecified: Secondary | ICD-10-CM | POA: Diagnosis not present

## 2016-10-15 DIAGNOSIS — G5603 Carpal tunnel syndrome, bilateral upper limbs: Secondary | ICD-10-CM

## 2016-10-15 DIAGNOSIS — R5383 Other fatigue: Secondary | ICD-10-CM | POA: Diagnosis not present

## 2016-10-15 LAB — POCT URINALYSIS DIPSTICK
Bilirubin, UA: NEGATIVE
Glucose, UA: NEGATIVE
Ketones, UA: NEGATIVE
Leukocytes, UA: NEGATIVE
Nitrite, UA: NEGATIVE
PROTEIN UA: NEGATIVE
UROBILINOGEN UA: 0.2 U/dL
pH, UA: 5 (ref 5.0–8.0)

## 2016-10-15 MED ORDER — PREDNISONE 10 MG PO TABS: ORAL_TABLET | ORAL | 0 refills | Status: DC

## 2016-10-15 MED ORDER — GABAPENTIN 100 MG PO CAPS: 100.0000 mg | ORAL_CAPSULE | Freq: Three times a day (TID) | ORAL | 3 refills | Status: DC

## 2016-10-15 MED ORDER — ALPRAZOLAM 0.5 MG PO TABS: 0.5000 mg | ORAL_TABLET | Freq: Every day | ORAL | 2 refills | Status: DC | PRN

## 2016-10-15 NOTE — Progress Notes (Signed)
Subjective:  Patient ID: Christina Stafford, female    DOB: 07/18/1970  Age: 46 y.o. MRN: [id number]  CC: The primary encounter diagnosis was Neuropathy. Diagnoses of Fatigue, unspecified type, Acute left-sided thoracic back pain, Pelvic pain, Carpal tunnel syndrome on both sides, Generalized anxiety disorder, Chronic left-sided low back pain without sciatica, and Excessive bleeding in premenopausal period were also pertinent to this visit.  HPI [name] presents for follow up on hypertension,  And GAD  Last seen in October .  Limited xanax  to once daily,  Prescribed zoloft , not taking the  Buspirone anymore .  Very rarely uses the xanax.   Cc: left sided back pain lasted 3-4 weeks.  Dull , did not move. Started after having urinary frequency and a feeling of heaviness,  But decided not to take an antibiiotic since only blood was seen in urine   Periods have been heavier,  With clots,  Severe cramping relieved with advil.  periods now irregular  History of dermoid ovarian  cyst removed in 2007 by [facility]. 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  Managed with nightly use of braces. ,  Has progressed to pain , and now has pain in  both shoulders stared 2 weeks ago after doing a lot of breast stroke in a friend's swimming pool.  .  Denies neck pain .  Occupational histoyr of typing all day for years.     Outpatient Medications Prior to Visit  Medication Sig Dispense Refill  . amLODipine (NORVASC) 2.5 MG tablet Take 1 tablet (2.5 mg total) by mouth daily. 90 tablet 3  . cetirizine (ZYRTEC) 10 MG tablet Take one by mouth daily when needed.     Marland Kitchen esomeprazole (NEXIUM) 20 MG capsule Take 20 mg by mouth daily at 12 noon.    . metoprolol succinate (TOPROL-XL) 25 MG 24 hr tablet TAKE 1 TABLET(25 MG) BY MOUTH DAILY 90 tablet 0  . sertraline (ZOLOFT) 50 MG tablet TAKE 1 AND 1/2 TABLETS(75 MG) BY MOUTH DAILY 135 tablet  0  . ALPRAZolam (XANAX) 0.5 MG tablet Take 1 tablet (0.5 mg total) by mouth daily as needed for anxiety (or panic attacks). 30 tablet 2  . albuterol (PROVENTIL HFA;VENTOLIN HFA) 108 (90 Base) MCG/ACT inhaler Inhale 2 puffs into the lungs every 6 (six) hours as needed for wheezing or shortness of breath. (Patient not taking: Reported on 10/15/2016) 1 Inhaler 0  . amoxicillin-clavulanate (AUGMENTIN) 875-125 MG tablet Take 1 tablet by mouth 2 (two) times daily. (Patient not taking: Reported on 10/15/2016) 20 tablet 0  . busPIRone (BUSPAR) 5 MG tablet Take 1 tablet (5 mg total) by mouth 3 (three) times daily. (Patient not taking: Reported on 10/15/2016) 90 tablet 1  . levofloxacin (LEVAQUIN) 500 MG tablet Take 1 tablet (500 mg total) by mouth daily. (Patient not taking: Reported on 12/28/2015) 7 tablet 0  . medroxyPROGESTERone (PROVERA) 5 MG tablet Take 1 tablet (5 mg total) by mouth daily. (Patient not taking: Reported on 12/28/2015) 10 tablet 0  . omeprazole (PRILOSEC OTC) 20 MG tablet Take 20 mg by mouth daily.    Marland Kitchen oseltamivir (TAMIFLU) 75 MG capsule Take 1 capsule (75 mg total) by mouth 2 (two) times daily. (Patient not taking: Reported on 10/15/2016) 10 capsule 0  . predniSONE (STERAPRED UNI-PAK 21 TAB) 10 MG (21) TBPK tablet Use as directed (Patient not taking: Reported on 10/15/2016)  21 tablet 0  . spironolactone (ALDACTONE) 25 MG tablet Take 1 tablet (25 mg total) by mouth daily. As needed  For fluid retention (Patient not taking: Reported on 12/28/2015) 30 tablet 0   No facility-administered medications prior to visit.     Review of Systems;  Patient denies headache, fevers, malaise, unintentional weight loss, skin rash, eye pain, sinus congestion and sinus pain, sore throat, dysphagia,  hemoptysis , cough, dyspnea, wheezing, chest pain, palpitations, orthopnea, edema, abdominal pain, nausea, melena, diarrhea, constipation, flank pain, dysuria, hematuria, urinary  Frequency, nocturia, numbness,  tingling, seizures,  Focal weakness, Loss of consciousness,  Tremor, insomnia, depression, anxiety, and suicidal ideation.      Objective:  BP 132/88 (BP Location: Left Arm, Patient Position: Sitting, Cuff Size: Large)   Pulse 73   Temp 98 F (36.7 C) (Oral)   Resp 15   Ht 5' 3"  (1.6 m)   Wt 208 lb (94.3 kg)   SpO2 96%   BMI 36.85 kg/m   BP Readings from Last 3 Encounters:  10/15/16 132/88  12/28/15 (!) 148/90  12/20/15 (!) 148/101    Wt Readings from Last 3 Encounters:  10/15/16 208 lb (94.3 kg)  12/28/15 195 lb 8 oz (88.7 kg)  12/20/15 192 lb (87.1 kg)    General appearance: alert, cooperative and appears stated age Ears: normal TM's and external ear canals both ears Throat: lips, mucosa, and tongue normal; teeth and gums normal Neck: no adenopathy, no carotid bruit, supple, symmetrical, trachea midline and thyroid not enlarged, symmetric, no tenderness/mass/nodules Back: symmetric, no curvature. ROM normal. No CVA tenderness. Lungs: clear to auscultation bilaterally Heart: regular rate and rhythm, S1, S2 normal, no murmur, click, rub or gallop Abdomen: soft, non-tender; bowel sounds normal; no masses,  no organomegaly Pulses: 2+ and symmetric Skin: Skin color, texture, turgor normal. No rashes or lesions Lymph nodes: Cervical, supraclavicular, and axillary nodes normal. Neuro:  awake and interactive with normal mood and affect. Higher cortical functions are normal. Speech is clear without word-finding difficulty or dysarthria. Extraocular movements are intact. Visual fields of both eyes are grossly intact. Sensation to light touch is grossly intact bilaterally of upper and lower extremities. Motor examination shows 4/5 symmetric hand grip and upper extremity and 5/5 lower extremity strength. Positive Tinel's sign  There is no pronation or drift. Gait is non-ataxic    Lab Results  Component Value Date   HGBA1C 5.3 10/12/2014    Lab Results  Component Value Date    CREATININE 0.95 10/15/2016   CREATININE 0.90 12/20/2015   CREATININE 0.84 06/07/2015    Lab Results  Component Value Date   WBC 6.8 10/15/2016   HGB 13.1 10/15/2016   HCT 38.6 10/15/2016   PLT 302.0 10/15/2016   GLUCOSE 116 (H) 10/15/2016   CHOL 327 (H) 06/07/2015   TRIG 162.0 (H) 06/07/2015   HDL 64.20 06/07/2015   LDLDIRECT 176.5 05/21/2012   LDLCALC 230 (H) 06/07/2015   ALT 18 10/15/2016   AST 16 10/15/2016   NA 136 10/15/2016   K 4.1 10/15/2016   CL 102 10/15/2016   CREATININE 0.95 10/15/2016   BUN 14 10/15/2016   CO2 28 10/15/2016   TSH 2.89 10/15/2016   HGBA1C 5.3 10/12/2014   MICROALBUR 0.6 03/26/2011    Dg Chest 2 View  Result Date: 12/20/2015 CLINICAL DATA:  Dull chest pain starting Saturday. EXAM: CHEST  2 VIEW COMPARISON:  10/11/2014 FINDINGS: The lungs appear clear.  Cardiac and mediastinal contours normal. No pleural  effusion identified. Mild thoracic spondylosis. IMPRESSION: 1. No acute findings. 2. Mild thoracic spondylosis. Electronically Signed   By: Van Clines M.D.   On: 12/20/2015 19:42    Assessment & Plan:   Problem List Items Addressed This Visit    Heavy menses    With clotting and low back pain . History of dermoid ovarian cyst..  Ultrasound ordered.  Ultrasound ordered       Generalized anxiety disorder    Continue Zoloft daily for management of anxiety manaifested in somatic complaints       Chronic left-sided low back pain    Trial of prednisone and gabapentin       Relevant Medications   predniSONE (DELTASONE) 10 MG tablet   Carpal tunnel syndrome on both sides    Referring to neurology for confirmatory testing.      Relevant Medications   ALPRAZolam (XANAX) 0.5 MG tablet   gabapentin (NEURONTIN) 100 MG capsule    Other Visit Diagnoses    Neuropathy    -  Primary   Relevant Medications   gabapentin (NEURONTIN) 100 MG capsule   Other Relevant Orders   Vitamin B12 (Completed)   TSH (Completed)   NCV with  EMG(electromyography)   Fatigue, unspecified type       Relevant Orders   CBC with Differential/Platelet (Completed)   Comprehensive metabolic panel (Completed)   Acute left-sided thoracic back pain       Relevant Medications   predniSONE (DELTASONE) 10 MG tablet   Other Relevant Orders   POCT urinalysis dipstick (Completed)   Urine Microscopic Only (Completed)   Urine Culture (Completed)   Pelvic pain       Relevant Orders   US Transvaginal Non-OB      I have discontinued Ms. Devilla's omeprazole, medroxyPROGESTERone, spironolactone, levofloxacin, busPIRone, oseltamivir, predniSONE, albuterol, and amoxicillin-clavulanate. I am also having her start on predniSONE and gabapentin. Additionally, I am having her maintain her cetirizine, esomeprazole, amLODipine, metoprolol succinate, sertraline, and ALPRAZolam.  Meds ordered this encounter  Medications  . predniSONE (DELTASONE) 10 MG tablet    Sig: 6 tablets on Day 1 , then reduce by 1 tablet daily until gone    Dispense:  21 tablet    Refill:  0  . ALPRAZolam (XANAX) 0.5 MG tablet    Sig: Take 1 tablet (0.5 mg total) by mouth daily as needed for anxiety (or panic attacks).    Dispense:  30 tablet    Refill:  2  . gabapentin (NEURONTIN) 100 MG capsule    Sig: Take 1 capsule (100 mg total) by mouth 3 (three) times daily.    Dispense:  90 capsule    Refill:  3    Medications Discontinued During This Encounter  Medication Reason  . albuterol (PROVENTIL HFA;VENTOLIN HFA) 108 (90 Base) MCG/ACT inhaler Patient has not taken in last 30 days  . amoxicillin-clavulanate (AUGMENTIN) 875-125 MG tablet Therapy completed  . busPIRone (BUSPAR) 5 MG tablet Patient has not taken in last 30 days  . levofloxacin (LEVAQUIN) 500 MG tablet Therapy completed  . medroxyPROGESTERone (PROVERA) 5 MG tablet Therapy completed  . omeprazole (PRILOSEC OTC) 20 MG tablet Patient has not taken in last 30 days  . oseltamivir (TAMIFLU) 75 MG capsule Patient  has not taken in last 30 days  . predniSONE (STERAPRED UNI-PAK 21 TAB) 10 MG (21) TBPK tablet Therapy completed  . spironolactone (ALDACTONE) 25 MG tablet Patient has not taken in last 30 days  . ALPRAZolam (XANAX) 0.5  MG tablet Reorder    Follow-up: No Follow-up on file.   Crecencio Mc, MD

## 2016-10-15 NOTE — Patient Instructions (Signed)
  The new goals for optimal blood pressure management are 120/70.  Please check your blood pressure a few times at home and send me the readings so I can determine if you need a change in medication .  We can easilyincrease the amlodipine  EMG nerve conduction studies for the  Wrist pain and numbness  Prednisone taper : start  Tomorrow   Ultrasound of pelvis to be ordered

## 2016-10-16 ENCOUNTER — Telehealth: Payer: Self-pay

## 2016-10-16 DIAGNOSIS — R102 Pelvic and perineal pain: Secondary | ICD-10-CM

## 2016-10-16 DIAGNOSIS — G8929 Other chronic pain: Secondary | ICD-10-CM | POA: Insufficient documentation

## 2016-10-16 DIAGNOSIS — M545 Low back pain, unspecified: Secondary | ICD-10-CM | POA: Insufficient documentation

## 2016-10-16 DIAGNOSIS — N920 Excessive and frequent menstruation with regular cycle: Secondary | ICD-10-CM | POA: Insufficient documentation

## 2016-10-16 LAB — CBC WITH DIFFERENTIAL/PLATELET
BASOS PCT: 2.1 % (ref 0.0–3.0)
Basophils Absolute: 0.1 10*3/uL (ref 0.0–0.1)
EOS ABS: 0.3 10*3/uL (ref 0.0–0.7)
Eosinophils Relative: 3.8 % (ref 0.0–5.0)
HCT: 38.6 % (ref 36.0–46.0)
Hemoglobin: 13.1 g/dL (ref 12.0–15.0)
LYMPHS ABS: 2.1 10*3/uL (ref 0.7–4.0)
Lymphocytes Relative: 31.2 % (ref 12.0–46.0)
MCHC: 34 g/dL (ref 30.0–36.0)
MCV: 94.8 fl (ref 78.0–100.0)
MONO ABS: 0.3 10*3/uL (ref 0.1–1.0)
Monocytes Relative: 5 % (ref 3.0–12.0)
NEUTROS PCT: 57.9 % (ref 43.0–77.0)
Neutro Abs: 3.9 10*3/uL (ref 1.4–7.7)
Platelets: 302 10*3/uL (ref 150.0–400.0)
RBC: 4.07 Mil/uL (ref 3.87–5.11)
RDW: 14.9 % (ref 11.5–15.5)
WBC: 6.8 10*3/uL (ref 4.0–10.5)

## 2016-10-16 LAB — COMPREHENSIVE METABOLIC PANEL
ALT: 18 U/L (ref 0–35)
AST: 16 U/L (ref 0–37)
Albumin: 4 g/dL (ref 3.5–5.2)
Alkaline Phosphatase: 79 U/L (ref 39–117)
BUN: 14 mg/dL (ref 6–23)
CHLORIDE: 102 meq/L (ref 96–112)
CO2: 28 mEq/L (ref 19–32)
Calcium: 9.2 mg/dL (ref 8.4–10.5)
Creatinine, Ser: 0.95 mg/dL (ref 0.40–1.20)
GFR: 66.68 mL/min (ref >60.00)
GLUCOSE: 116 mg/dL — AB (ref 70–99)
POTASSIUM: 4.1 meq/L (ref 3.5–5.1)
SODIUM: 136 meq/L (ref 135–145)
Total Bilirubin: 0.3 mg/dL (ref 0.2–1.2)
Total Protein: 7 g/dL (ref 6.0–8.3)

## 2016-10-16 LAB — TSH: TSH: 2.89 u[IU]/mL (ref 0.35–4.50)

## 2016-10-16 LAB — URINALYSIS, MICROSCOPIC ONLY
RBC / HPF: NONE SEEN (ref 0–?)
WBC, UA: NONE SEEN (ref 0–?)

## 2016-10-16 LAB — URINE CULTURE: ORGANISM ID, BACTERIA: NO GROWTH

## 2016-10-16 LAB — VITAMIN B12: Vitamin B-12: 292 pg/mL (ref 211–911)

## 2016-10-16 NOTE — Assessment & Plan Note (Signed)
Trial of prednisone and gabapentin

## 2016-10-16 NOTE — Assessment & Plan Note (Signed)
Referring to neurology for confirmatory testing.

## 2016-10-16 NOTE — Telephone Encounter (Signed)
Ordered correct [place]

## 2016-10-16 NOTE — Assessment & Plan Note (Signed)
With clotting and low back pain . History of dermoid ovarian cyst..  Ultrasound ordered.  Ultrasound ordered

## 2016-10-16 NOTE — Assessment & Plan Note (Signed)
Continue Zoloft daily for management of anxiety manaifested in somatic complaints

## 2016-10-20 ENCOUNTER — Other Ambulatory Visit: Payer: Self-pay | Admitting: Internal Medicine

## 2016-10-22 ENCOUNTER — Ambulatory Visit
Admission: RE | Admit: 2016-10-22 | Discharge: 2016-10-22 | Disposition: A | Discharge status: Home / Self Care | Payer: BLUE CROSS/BLUE SHIELD | Source: Ambulatory Visit | Attending: Internal Medicine | Admitting: Internal Medicine

## 2016-10-22 DIAGNOSIS — R102 Pelvic and perineal pain: Secondary | ICD-10-CM | POA: Diagnosis present

## 2016-10-22 DIAGNOSIS — N839 Noninflammatory disorder of ovary, fallopian tube and broad ligament, unspecified: Secondary | ICD-10-CM | POA: Diagnosis not present

## 2016-10-22 DIAGNOSIS — D259 Leiomyoma of uterus, unspecified: Secondary | ICD-10-CM | POA: Insufficient documentation

## 2016-10-23 ENCOUNTER — Other Ambulatory Visit: Payer: Self-pay | Admitting: Internal Medicine

## 2016-10-23 DIAGNOSIS — N858 Other specified noninflammatory disorders of uterus: Secondary | ICD-10-CM

## 2016-10-23 DIAGNOSIS — N838 Other noninflammatory disorders of ovary, fallopian tube and broad ligament: Secondary | ICD-10-CM

## 2016-10-24 ENCOUNTER — Encounter: Payer: Self-pay | Admitting: Neurology

## 2016-10-25 ENCOUNTER — Other Ambulatory Visit: Payer: Self-pay | Admitting: *Deleted

## 2016-10-25 ENCOUNTER — Encounter: Payer: Self-pay | Admitting: Internal Medicine

## 2016-10-25 DIAGNOSIS — R200 Anesthesia of skin: Secondary | ICD-10-CM

## 2016-11-21 ENCOUNTER — Ambulatory Visit (INDEPENDENT_AMBULATORY_CARE_PROVIDER_SITE_OTHER): Payer: BLUE CROSS/BLUE SHIELD | Admitting: Obstetrics and Gynecology

## 2016-11-21 ENCOUNTER — Encounter: Payer: Self-pay | Admitting: Obstetrics and Gynecology

## 2016-11-21 VITALS — BP 110/75 | HR 76 | Ht 63.0 in | Wt 212.3 lb

## 2016-11-21 DIAGNOSIS — N946 Dysmenorrhea, unspecified: Secondary | ICD-10-CM | POA: Diagnosis not present

## 2016-11-21 DIAGNOSIS — Z9889 Other specified postprocedural states: Secondary | ICD-10-CM

## 2016-11-21 DIAGNOSIS — Z8742 Personal history of other diseases of the female genital tract: Secondary | ICD-10-CM | POA: Insufficient documentation

## 2016-11-21 DIAGNOSIS — N926 Irregular menstruation, unspecified: Secondary | ICD-10-CM

## 2016-11-21 DIAGNOSIS — Z9851 Tubal ligation status: Secondary | ICD-10-CM | POA: Diagnosis not present

## 2016-11-21 DIAGNOSIS — N83299 Other ovarian cyst, unspecified side: Secondary | ICD-10-CM

## 2016-11-21 DIAGNOSIS — Z90710 Acquired absence of both cervix and uterus: Secondary | ICD-10-CM | POA: Insufficient documentation

## 2016-11-21 NOTE — Patient Instructions (Signed)
1. Ultrasound scheduled-6 weeks 2. Follow-up appointment scheduled-7 weeks   Ovarian Cyst An ovarian cyst is a fluid-filled sac that forms on an ovary. The ovaries are small organs that produce eggs in women. Various types of cysts can form on the ovaries. Some may cause symptoms and require treatment. Most ovarian cysts go away on their own, are not cancerous (are benign), and do not cause problems. Common types of ovarian cysts include:  Functional (follicle) cysts. ? Occur during the menstrual cycle, and usually go away with the next menstrual cycle if you do not get pregnant. ? Usually cause no symptoms.  Endometriomas. ? Are cysts that form from the tissue that lines the uterus (endometrium). ? Are sometimes called "chocolate cysts" because they become filled with blood that turns brown. ? Can cause pain in the lower abdomen during intercourse and during your period.  Cystadenoma cysts. ? Develop from cells on the outside surface of the ovary. ? Can get very large and cause lower abdomen pain and pain with intercourse. ? Can cause severe pain if they twist or break open (rupture).  Dermoid cysts. ? Are sometimes found in both ovaries. ? May contain different kinds of body tissue, such as skin, teeth, hair, or cartilage. ? Usually do not cause symptoms unless they get very big.  Theca lutein cysts. ? Occur when too much of a certain hormone (human chorionic gonadotropin) is produced and overstimulates the ovaries to produce an egg. ? Are most common after having procedures used to assist with the conception of a baby (in vitro fertilization).  What are the causes? Ovarian cysts may be caused by:  Ovarian hyperstimulation syndrome. This is a condition that can develop from taking fertility medicines. It causes multiple large ovarian cysts to form.  Polycystic ovarian syndrome (PCOS). This is a common hormonal disorder that can cause ovarian cysts, as well as problems with your  period or fertility.  What increases the risk? The following factors may make you more likely to develop ovarian cysts:  Being overweight or obese.  Taking fertility medicines.  Taking certain forms of hormonal birth control.  Smoking.  What are the signs or symptoms? Many ovarian cysts do not cause symptoms. If symptoms are present, they may include:  Pelvic pain or pressure.  Pain in the lower abdomen.  Pain during sex.  Abdominal swelling.  Abnormal menstrual periods.  Increasing pain with menstrual periods.  How is this diagnosed? These cysts are commonly found during a routine pelvic exam. You may have tests to find out more about the cyst, such as:  Ultrasound.  X-ray of the pelvis.  CT scan.  MRI.  Blood tests.  How is this treated? Many ovarian cysts go away on their own without treatment. Your health care provider may want to check your cyst regularly for 2-3 months to see if it changes. If you are in menopause, it is especially important to have your cyst monitored closely because menopausal women have a higher rate of ovarian cancer. When treatment is needed, it may include:  Medicines to help relieve pain.  A procedure to drain the cyst (aspiration).  Surgery to remove the whole cyst.  Hormone treatment or birth control pills. These methods are sometimes used to help dissolve a cyst.  Follow these instructions at home:  Take over-the-counter and prescription medicines only as told by your health care provider.  Do not drive or use heavy machinery while taking prescription pain medicine.  Get regular pelvic exams  and Pap tests as often as told by your health care provider.  Return to your normal activities as told by your health care provider. Ask your health care provider what activities are safe for you.  Do not use any products that contain nicotine or tobacco, such as cigarettes and e-cigarettes. If you need help quitting, ask your health  care provider.  Keep all follow-up visits as told by your health care provider. This is important. Contact a health care provider if:  Your periods are late, irregular, or painful, or they stop.  You have pelvic pain that does not go away.  You have pressure on your bladder or trouble emptying your bladder completely.  You have pain during sex.  You have any of the following in your abdomen: ? A feeling of fullness. ? Pressure. ? Discomfort. ? Pain that does not go away. ? Swelling.  You feel generally ill.  You become constipated.  You lose your appetite.  You develop severe acne.  You start to have more body hair and facial hair.  You are gaining weight or losing weight without changing your exercise and eating habits.  You think you may be pregnant. Get help right away if:  You have abdominal pain that is severe or gets worse.  You cannot eat or drink without vomiting.  You suddenly develop a fever.  Your menstrual period is much heavier than usual. This information is not intended to replace advice given to you by your health care provider. Make sure you discuss any questions you have with your health care provider. Document Released: 03/04/2005 Document Revised: 09/22/2015 Document Reviewed: 08/06/2015 Elsevier Interactive Patient Education  [employer].

## 2016-11-21 NOTE — Progress Notes (Signed)
GYN ENCOUNTER NOTE  Subjective:       Christina Stafford is a 46 y.o. G50P1001 female is here for gynecologic evaluation of the following issues:  1. Complex right ovarian cyst 2. Pelvic pain 3. Dysmenorrhea 4. Irregular cycles.    46 year old white femalePara 1001, status post BTL for contraception, with history of dermoid cyst status post ovarian cystectomy (uncertain side), status post BTL, presents for evaluation of complex right ovarian cyst noted on recent ultrasound. Patient states that she was told she possibly had endometriosis by Dr. Rayford Halsted in the past at the time of laparoscopic removal of dermoid and BTL in 2006. Since that time Christina Stafford has noted menstrual irregularity. Intervals-12 days to 60 days Duration of flow 5 days Dysmenorrhea-mild to moderate No intermenstrual bleeding. No postcoital bleeding. No deep thrusting dyspareunia. Patient has been experiencing intermittent vasomotor symptoms. Christina Stafford has noted no significant GI or GU symptoms perimenstrually.   ULTRASOUND REPORT: (10/22/2016) CLINICAL DATA:  Pelvic pain and fullness  EXAM: TRANSABDOMINAL AND TRANSVAGINAL ULTRASOUND OF PELVIS  TECHNIQUE: Study was performed transabdominally to optimize pelvic field of view evaluation and transvaginally to optimize internal visceral architecture evaluation.  COMPARISON:  CT abdomen pelvis August 27, 2006  FINDINGS: Uterus  Measurements: 9.8 x 4.2 x 5.1 cm there is a hypoechoic mass in the midportion of the uterus measuring 0.8 x 0.6 x 0.8 cm. There is a slightly hyperechoic solid mass in the midline superior fundus along the superior aspect of the endometrium measuring 1.4 x 1.3 x 1.1 cm. No fibroids or other mass visualized.  Endometrium  Thickness: 6 mm.  No focal abnormality visualized.  Right ovary  Measurements: 6.0 x 3.6 x 2.3 cm. Within the right ovary, there is a mixed echogenicity mass measuring 4.9 x 3.2 x 4.4 cm. No other lesion  identified in the right pelvis.  Left ovary  Measurements: 3.8 x 1.2 x 1.9 cm. Normal appearance/no adnexal mass.  Other findings  No abnormal free fluid.  IMPRESSION: 1. There is a complex right ovarian mass measuring 4.9 x 3.2 x 4.4 cm. This mass has mixed echogenicity with areas that are felt to represent a degree of cystic component. Differential considerations include hemorrhagic cyst, endometrioma, or ovarian neoplasm. Further assessment advised. Short-interval follow up ultrasound in 6-12 weeks is recommended, preferably during the week following the patient's normal menses. If endometrioma or ovarian neoplasm is a serious clinical concern, pelvic MR at this time would be a reasonable consideration.  2.  Small intrauterine leiomyomas.  3.  No left-sided pelvic mass.  4.  Endometrial thickness within normal limits for age.  These results will be called to the ordering clinician or representative by the Radiologist Assistant, and communication documented in the PACS or zVision Dashboard.   Electronically Signed   By: Lowella Grip III M.D.   On: 10/22/2016 15:03   Obstetric History OB History  Gravida Para Term Preterm AB Living  1 1 1     1   SAB TAB Ectopic Multiple Live Births          1    # Outcome Date GA Lbr Len/2nd Weight Sex Delivery Anes PTL Lv  1 Term 2002   9 lb 1.8 oz (4.132 kg) F Vag-Spont   LIV      Past Medical History:  Diagnosis Date  . Carpal tunnel syndrome on both sides   . Depression   . GERD (gastroesophageal reflux disease)   . Hyperlipidemia   . Hypertension   .  Obesity   . Sleep apnea 2011   using CPAP 6 cm H20  [facility] Sleep Study    Past Surgical History:  Procedure Laterality Date  . BREAST SURGERY  2003   negative biopsy  . CHOLECYSTECTOMY  1995  . TONSILLECTOMY AND ADENOIDECTOMY  2003  . TUBAL LIGATION      Current Outpatient Prescriptions on File Prior to Visit  Medication Sig Dispense Refill  .  ALPRAZolam (XANAX) 0.5 MG tablet Take 1 tablet (0.5 mg total) by mouth daily as needed for anxiety (or panic attacks). 30 tablet 2  . amLODipine (NORVASC) 2.5 MG tablet Take 1 tablet (2.5 mg total) by mouth daily. 90 tablet 3  . cetirizine (ZYRTEC) 10 MG tablet Take one by mouth daily when needed.     Marland Kitchen esomeprazole (NEXIUM) 20 MG capsule Take 20 mg by mouth daily at 12 noon.    . metoprolol succinate (TOPROL-XL) 25 MG 24 hr tablet TAKE 1 TABLET(25 MG) BY MOUTH DAILY 90 tablet 0  . sertraline (ZOLOFT) 50 MG tablet TAKE 1 AND 1/2 TABLETS(75 MG) BY MOUTH DAILY 135 tablet 0   No current facility-administered medications on file prior to visit.     No Known Allergies  Social History   Social History  . Marital status: Married    Spouse name: N/A  . Number of children: N/A  . Years of education: N/A   Occupational History  . Not on file.   Social History Main Topics  . Smoking status: Former Smoker    Start date: 05/17/2011  . Smokeless tobacco: Never Used  . Alcohol use 1.8 oz/week    3 Glasses of wine per week     Comment: occas  . Drug use: No  . Sexual activity: Yes    Birth control/ protection: Surgical   Other Topics Concern  . Not on file   Social History Narrative  . No narrative on file    Family History  Problem Relation Age of Onset  . Hypertension Mother   . Diabetes Mother   . Uterine cancer Mother   . Hyperlipidemia Father   . Breast cancer Neg Hx   . Ovarian cancer Neg Hx   . Colon cancer Neg Hx     The following portions of the patient's history were reviewed and updated as appropriate: allergies, current medications, past family history, past medical history, past social history, past surgical history and problem list.  Review of Systems Review of Systems -Per history of present illness for pertinent positives; comprehensive review of systems is otherwise negative  Objective:   BP 110/75   Pulse 76   Ht 5' 3"  (1.6 m)   Wt 212 lb 4.8 oz (96.3 kg)    LMP 10/03/2016 (Exact Date)   BMI 37.61 kg/m  CONSTITUTIONAL: Well-developed, well-nourished female in no acute distress.  HENT:  Normocephalic, atraumatic.  NECK: Normal range of motion, supple, no masses.  Normal thyroid.  SKIN: Skin is warm and dry. No rash noted. Not diaphoretic. No erythema. No pallor. [place]: Alert and oriented to person, place, and time. PSYCHIATRIC: Normal mood and affect. Normal behavior. Normal judgment and thought content. CARDIOVASCULAR:Not Examined RESPIRATORY: Not Examined BREASTS: Not Examined ABDOMEN: Soft, non distended; Non tender.  No Organomegaly. BACK: No CVA tenderness; no spinal tenderness PELVIC:  External Genitalia: Normal  BUS: Normal  Vagina: Normal  Cervix: Normal; parous; without cervical motion tenderness  Uterus: Top normal size, shape,consistency, mobile, nontender  Adnexa: Normal; nonpalpable and nontender  RV: Normal external exam; normal sphincter tone; no rectal masses  Bladder: Nontender MUSCULOSKELETAL: Normal range of motion. No tenderness.  No cyanosis, clubbing, or edema.     Assessment:   1. Complex ovarian cyst, Right  2. History of removal of ovarian cyst, dermoid, 2006  3. Status post tubal ligation, 2006  4. Dysmenorrhea  5. History of endometriosis  6. Irregular menstrual bleeding, climacteric     Plan:   1. Differential diagnosis of complex ovarian cysts and management was reviewed 2. Repeat pelvic ultrasound in 6 weeks 3. Return in 7 weeks for follow-up 4. Ibuprofen when necessary for pelvic pain  A total of 30 minutes were spent face-to-face with the patient during the encounter with greater than 50% dealing with counseling and coordination of care.  Brayton Mars, MD  Note: This dictation was prepared with Dragon dictation along with smaller phrase technology. Any transcriptional errors that result from this process are unintentional.

## 2016-11-26 ENCOUNTER — Encounter: Payer: Self-pay | Admitting: Internal Medicine

## 2016-11-26 ENCOUNTER — Ambulatory Visit (INDEPENDENT_AMBULATORY_CARE_PROVIDER_SITE_OTHER): Payer: BLUE CROSS/BLUE SHIELD | Admitting: Neurology

## 2016-11-26 DIAGNOSIS — R200 Anesthesia of skin: Secondary | ICD-10-CM

## 2016-11-26 DIAGNOSIS — G5603 Carpal tunnel syndrome, bilateral upper limbs: Secondary | ICD-10-CM

## 2016-11-26 NOTE — Procedures (Signed)
[facility]  [place]  [place], [place] [address] Tel: [phone] Fax:  [phone] Test Date:  11/26/2016  Patient: Christina Stafford DOB: 1970-09-14 Physician: Narda Amber, DO  Sex: Female Height: 5' 3"  Ref Phys: Crecencio Mc, M.D.  ID#: [id number] Temp: 36.1C Technician:    Patient Complaints: This is a 46 year old female referred for evaluation of bilateral hand numbness.  NCV & EMG Findings: Extensive electrodiagnostic testing of the right upper extremity and additional studies of the left shows:  1. Right median sensory response is absent. Left median sensory response shows prolonged latency (5.8 ms) and reduced amplitude (13.9 V).  Bilateral ulnar sensory responses are within normal limits. 2. Bilateral median motor responses show prolonged latency (R6.6, L5.9 ms) and normal amplitude. Bilateral ulnar motor responses are within normal limits. 3. Rapid recruitment pattern is seen in bilateral abductor pollicis brevis muscles, without associated changes in motor unit configuration. Motor unit configuration and recruitment pattern is within normal limits.  Impression: 1. Right median neuropathy at or distal to the wrist, consistent with clinical diagnosis of carpal tunnel syndrome; severe in degree electrically. 2. Left median neuropathy at or distal to the wrist, consistent with clinical diagnosis of carpal tunnel syndrome; moderate-to-severe in degree electrically.   ___________________________ Narda Amber, DO    Nerve Conduction Studies Anti Sensory Summary Table   Site NR Peak (ms) Norm Peak (ms) P-T Amp (V) Norm P-T Amp  Left Median Anti Sensory (2nd Digit)  Wrist    5.8 <3.4 13.9 >20  Right Median Anti Sensory (2nd Digit)  Wrist NR  <3.4  >20  Left Ulnar Anti Sensory (5th Digit)  Wrist    2.4 <3.1 31.1 >12  Right Ulnar Anti Sensory (5th Digit)  Wrist    2.1 <3.1 23.2 >12   Motor Summary Table   Site NR Onset (ms) Norm  Onset (ms) O-P Amp (mV) Norm O-P Amp Site1 Site2 Delta-0 (ms) Dist (cm) Vel (m/s) Norm Vel (m/s)  Left Median Motor (Abd Poll Brev)  Wrist    5.9 <3.9 9.2 >6 Elbow Wrist 4.8 27.0 56 >50  Elbow    10.7  8.5         Right Median Motor (Abd Poll Brev)  Wrist    6.6 <3.9 9.4 >6 Elbow Wrist 5.0 25.0 50 >50  Elbow    11.6  8.8         Left Ulnar Motor (Abd Dig Minimi)  Wrist    2.0 <3.1 7.7 >7 B Elbow Wrist 3.2 21.0 66 >50  B Elbow    5.2  7.3  A Elbow B Elbow 1.7 10.0 59 >50  A Elbow    6.9  7.3         Right Ulnar Motor (Abd Dig Minimi)  Wrist    1.9 <3.1 9.0 >7 B Elbow Wrist 3.3 23.0 70 >50  B Elbow    5.2  8.6  A Elbow B Elbow 1.6 10.0 63 >50  A Elbow    6.8  8.3          EMG   Side Muscle Ins Act Fibs Psw Fasc Number Recrt Dur Dur. Amp Amp. Poly Poly. Comment  Right 1stDorInt Nml Nml Nml Nml Nml Nml Nml Nml Nml Nml Nml Nml N/A  Right Abd Poll Brev Nml Nml Nml Nml 1- Rapid Nml Nml Nml Nml Nml Nml N/A  Right Ext Indicis Nml Nml Nml Nml Nml Nml Nml Nml Nml Nml Nml  Nml N/A  Right PronatorTeres Nml Nml Nml Nml Nml Nml Nml Nml Nml Nml Nml Nml N/A  Right Biceps Nml Nml Nml Nml Nml Nml Nml Nml Nml Nml Nml Nml N/A  Right Triceps Nml Nml Nml Nml Nml Nml Nml Nml Nml Nml Nml Nml N/A  Right Deltoid Nml Nml Nml Nml Nml Nml Nml Nml Nml Nml Nml Nml N/A  Left 1stDorInt Nml Nml Nml Nml Nml Nml Nml Nml Nml Nml Nml Nml N/A  Left Abd Poll Brev Nml Nml Nml Nml 1- Rapid Nml Nml Nml Nml Nml Nml N/A  Left PronatorTeres Nml Nml Nml Nml Nml Nml Nml Nml Nml Nml Nml Nml N/A  Left Biceps Nml Nml Nml Nml Nml Nml Nml Nml Nml Nml Nml Nml N/A  Left Triceps Nml Nml Nml Nml Nml Nml Nml Nml Nml Nml Nml Nml N/A  Left Deltoid Nml Nml Nml Nml Nml Nml Nml Nml Nml Nml Nml Nml N/A      Waveforms:

## 2016-11-28 ENCOUNTER — Other Ambulatory Visit: Payer: Self-pay | Admitting: Internal Medicine

## 2016-11-28 DIAGNOSIS — G5603 Carpal tunnel syndrome, bilateral upper limbs: Secondary | ICD-10-CM

## 2016-12-07 ENCOUNTER — Other Ambulatory Visit: Payer: Self-pay | Admitting: Internal Medicine

## 2016-12-10 ENCOUNTER — Encounter: Payer: Self-pay | Admitting: Internal Medicine

## 2017-01-02 ENCOUNTER — Ambulatory Visit (INDEPENDENT_AMBULATORY_CARE_PROVIDER_SITE_OTHER): Payer: BLUE CROSS/BLUE SHIELD

## 2017-01-02 DIAGNOSIS — N83299 Other ovarian cyst, unspecified side: Secondary | ICD-10-CM

## 2017-01-08 ENCOUNTER — Ambulatory Visit (INDEPENDENT_AMBULATORY_CARE_PROVIDER_SITE_OTHER): Payer: BLUE CROSS/BLUE SHIELD | Admitting: Obstetrics and Gynecology

## 2017-01-08 ENCOUNTER — Encounter: Payer: Self-pay | Admitting: Obstetrics and Gynecology

## 2017-01-08 VITALS — BP 123/82 | HR 84 | Ht 63.0 in | Wt 214.7 lb

## 2017-01-08 DIAGNOSIS — Z8742 Personal history of other diseases of the female genital tract: Secondary | ICD-10-CM

## 2017-01-08 DIAGNOSIS — D259 Leiomyoma of uterus, unspecified: Secondary | ICD-10-CM | POA: Diagnosis not present

## 2017-01-08 DIAGNOSIS — N83299 Other ovarian cyst, unspecified side: Secondary | ICD-10-CM | POA: Diagnosis not present

## 2017-01-08 DIAGNOSIS — Z9889 Other specified postprocedural states: Secondary | ICD-10-CM | POA: Diagnosis not present

## 2017-01-08 DIAGNOSIS — N946 Dysmenorrhea, unspecified: Secondary | ICD-10-CM

## 2017-01-08 NOTE — Patient Instructions (Addendum)
1.  Patient is to discuss management options with family prior to deciding upon surgery.  Patient to call back with desired plans. 2.  A 27-monthfollow-up exam is scheduled (should patient not opt for surgery) 3.  Options of management include the following:  Observation  Laparoscopic removal of the right adnexal mass-right tube and ovary  Hysterectomy with one or both ovaries removed; this could require estrogen replacement therapy to prevent hot flashes post surgery

## 2017-01-08 NOTE — Progress Notes (Signed)
Chief complaint: 1.  Complex right ovarian cyst 2.  History of dermoid cyst removal in 2006 3.  Dysmenorrhea 4.  History of endometriosis 5.  Irregular menstrual bleeding 6.  Climacteric  Patient presents for follow-up after pelvic ultrasound patient was previously noted to have a complex right ovarian cyst. 10/22/2016 ultrasound was notable for a 4.9 x 3.2 x 4.4 cm complex right ovarian cyst; left ovary is normal.  Uterus was enlarged measuring 9.8 x 4.2 x 5.1 cm and there was a possible 1.4 cm mass in the superior fundus 01/02/2017 ultrasound notable for persistent complex right ovarian cyst measuring 4.5 x 3.6 x 3.6 cm  Patient has been taking ibuprofen for dysmenorrhea.  Past medical history, past surgical history, problem list, medications, and allergies are reviewed  OBJECTIVE: BP 123/82   Pulse 84   Ht 5' 3"  (1.6 m)   Wt 214 lb 11.2 oz (97.4 kg)   LMP 12/18/2016 (Exact Date)   BMI 38.03 kg/m  Physical exam-deferred  ASSESSMENT: 1.  Complex right ovarian cyst, stable, minimally symptomatic; possible endometrioma 2.  History of endometriosis 3.  History of prior dermoid cyst removal in 2006 4.  Dysmenorrhea 5.  Climacteric 6.  Family history of uterine cancer in mom  PLAN: 1.  Management options were reviewed with patient.  She is to discuss options with family and get back to me regarding desired plan 2.  Six-month follow-up exam is scheduled 3.  Management options reviewed:  Observation  Laparoscopic removal of the right adnexal mass-right tube and ovary  Hysterectomy with one or both ovaries removed; this could require estrogen replacement therapy to prevent hot flashes post surgery  A total of 15 minutes were spent face-to-face with the patient during this encounter and over half of that time dealt with counseling and coordination of care.  Brayton Mars, MD  Note: This dictation was prepared with Dragon dictation along with smaller phrase technology. Any  transcriptional errors that result from this process are unintentional.

## 2017-01-15 ENCOUNTER — Other Ambulatory Visit: Payer: Self-pay | Admitting: Internal Medicine

## 2017-01-16 ENCOUNTER — Other Ambulatory Visit: Payer: Self-pay | Admitting: Internal Medicine

## 2017-01-22 ENCOUNTER — Encounter: Payer: Self-pay | Admitting: Obstetrics and Gynecology

## 2017-02-04 ENCOUNTER — Encounter: Payer: Self-pay | Admitting: Obstetrics and Gynecology

## 2017-02-04 ENCOUNTER — Ambulatory Visit (INDEPENDENT_AMBULATORY_CARE_PROVIDER_SITE_OTHER): Payer: BLUE CROSS/BLUE SHIELD | Admitting: Obstetrics and Gynecology

## 2017-02-04 VITALS — BP 101/66 | HR 80 | Ht 63.0 in | Wt 214.9 lb

## 2017-02-04 DIAGNOSIS — D259 Leiomyoma of uterus, unspecified: Secondary | ICD-10-CM

## 2017-02-04 DIAGNOSIS — Z8742 Personal history of other diseases of the female genital tract: Secondary | ICD-10-CM

## 2017-02-04 DIAGNOSIS — N83299 Other ovarian cyst, unspecified side: Secondary | ICD-10-CM

## 2017-02-04 NOTE — Progress Notes (Signed)
Chief complaint: 1.  Complex right ovarian cyst, persisting 2.  History of endometriosis 3.  History of uterine fibroids  Patient presents for conference to discuss management planning of future surgery.  She does have history of endometriosis; she does have a persistent 4.5 cm complex right ovarian cyst, suspicious for an endometrioma, and she does have menorrhagia and dysmenorrhea likely related to endometriosis.  She also is status post a dermoid cyst removal in the past.  Management options were reviewed in detail.  Recommendation is for LAVH RSO at this time.  Left ovarian conservation will be considered if there is no significant pathology identified in that adnexa.  Postoperative issues were also addressed including convalescence and physiologic function post surgery including sexual function.  Patient is status post spontaneous vaginal delivery of a 9+ pound baby.  Past medical history, past surgical history, problem list, medications, and allergies are reviewed  OBJECTIVE: BP 101/66   Pulse 80   Ht 5' 3"  (1.6 m)   Wt 214 lb 14.4 oz (97.5 kg)   LMP 01/17/2017 (Exact Date)   BMI 38.07 kg/m  Physical exam deferred  ASSESSMENT: 1.  Symptomatic endometriosis 2.  Complex right ovarian cyst  PLAN: 1.  LAVH RSO 2.  Return 1 week prior to surgery for preop appointment 3.  Literature regarding LAVH is given  A total of 15 minutes were spent face-to-face with the patient during this encounter and over half of that time dealt with counseling and coordination of care.  Brayton Mars, MD  Note: This dictation was prepared with Dragon dictation along with smaller phrase technology. Any transcriptional errors that result from this process are unintentional.

## 2017-02-04 NOTE — Patient Instructions (Signed)
1.  LAVH RSO is to be scheduled in the new year 2.  Return the week before surgery for preop appointment  Laparoscopically Assisted Vaginal Hysterectomy A laparoscopically assisted vaginal hysterectomy (LAVH) is a surgical procedure to remove the uterus and cervix, and sometimes the ovaries and fallopian tubes. During an LAVH, some of the surgical removal is done through the vagina, and the rest is done through a few small surgical cuts (incisions) in the abdomen. This procedure is usually considered in women when a vaginal hysterectomy is not an option. Your health care provider will discuss the risks and benefits of the different surgical techniques at your appointment. Generally, recovery time is faster and there are fewer complications after laparoscopic procedures than after open incisional procedures. Tell a health care provider about:  Any allergies you have.  All medicines you are taking, including vitamins, herbs, eye drops, creams, and over-the-counter medicines.  Any problems you or family members have had with anesthetic medicines.  Any blood disorders you have.  Any surgeries you have had.  Any medical conditions you have. What are the risks? Generally, this is a safe procedure. However, as with any procedure, complications can occur. Possible complications include:  Allergies to medicines.  Difficulty breathing.  Bleeding.  Infection.  Damage to other structures near your uterus and cervix.  What happens before the procedure?  Ask your health care provider about changing or stopping your regular medicines.  Take certain medicines, such as a colon-emptying preparation, as directed.  Do not eat or drink anything for at least 8 hours before your surgery.  Stop smoking if you smoke. Stopping will improve your health after surgery.  Arrange for a ride home after surgery and for help at home during recovery. What happens during the procedure?  An IV tube will be  put into one of your veins in order to give you fluids and medicines.  You will receive medicines to relax you and medicines that make you sleep (general anesthetic).  You may have a flexible tube (catheter) put into your bladder to drain urine.  You may have a tube put through your nose or mouth that goes into your stomach (nasogastric tube). The nasogastric tube removes digestive fluids and prevents you from feeling nauseated and from vomiting.  Tight-fitting (compression) stockings will be placed on your legs to promote circulation.  Three to four small incisions will be made in your abdomen. An incision also will be made in your vagina. Probes and tools will be inserted into the small incisions. The uterus and cervix are removed (and possibly your ovaries and fallopian tubes) through your vagina as well as through the small incisions that were made in the abdomen.  Your vagina is then sewn back to normal. What happens after the procedure?  You may have a liquid diet temporarily. You will most likely return to, and tolerate, your usual diet the day after surgery.  You will be passing urine through a catheter. It will be removed the day after surgery.  Your temperature, breathing rate, heart rate, blood pressure, and oxygen level will be monitored regularly.  You will still wear compression stockings on your legs until you are able to move around.  You will use a special device or do breathing exercises to keep your lungs clear.  You will be encouraged to walk as soon as possible. This information is not intended to replace advice given to you by your health care provider. Make sure you discuss any questions  you have with your health care provider. Document Released: 02/21/2011 Document Revised: 08/10/2015 Document Reviewed: 09/17/2012 Elsevier Interactive Patient Education  [employer].

## 2017-03-13 ENCOUNTER — Other Ambulatory Visit: Payer: Self-pay | Admitting: Internal Medicine

## 2017-04-08 ENCOUNTER — Ambulatory Visit: Payer: BLUE CROSS/BLUE SHIELD | Admitting: Obstetrics and Gynecology

## 2017-04-08 ENCOUNTER — Encounter: Payer: Self-pay | Admitting: Obstetrics and Gynecology

## 2017-04-08 VITALS — BP 132/87 | HR 67 | Ht 63.0 in | Wt 218.2 lb

## 2017-04-08 DIAGNOSIS — Z01818 Encounter for other preprocedural examination: Secondary | ICD-10-CM

## 2017-04-08 DIAGNOSIS — Z8742 Personal history of other diseases of the female genital tract: Secondary | ICD-10-CM

## 2017-04-08 DIAGNOSIS — N83299 Other ovarian cyst, unspecified side: Secondary | ICD-10-CM

## 2017-04-08 NOTE — Progress Notes (Signed)
Patient ID: Christina Stafford, female   DOB: 1970-12-12, 47 y.o.   MRN: [id number] Subjective: PREOPERATIVE HISTORY AND PHYSICAL  Date of surgery: 04/14/2017 Diagnosis: Symptomatic endometriosis; complex ovarian cyst, right; history of uterine fibroids Procedure: LAVH BSO   Patient is a 47 y.o. G1P1035fmale scheduled for LAVH RSO on 04/14/2017.  She desires management of symptomatic endometriosis and excision of a persistent complex right ovarian cyst.  Right ovarian cyst measures 4.5 cm and has features suspicious for endometrioma. Patient does have long history of menorrhagia and dysmenorrhea. She is status post a dermoid cyst removal in the past. Patient has had one vaginal delivery of a 9+ pound baby.  Ultrasounds: 10/22/2016 ultrasound was notable for a 4.9 x 3.2 x 4.4 cm complex right ovarian cyst; left ovary is normal.  Uterus was enlarged measuring 9.8 x 4.2 x 5.1 cm and there was a possible 1.4 cm mass in the superior fundus 01/02/2017 ultrasound notable for persistent complex right ovarian cyst measuring 4.5 x 3.6 x 3.6 cm   Menstrual History: OB History    Gravida Para Term Preterm AB Living   1 1 1     1    SAB TAB Ectopic Multiple Live Births           1      Patient's last menstrual period was 01/20/2017 (exact date).    Past Medical History:  Diagnosis Date  . Carpal tunnel syndrome on both sides   . Depression   . GERD (gastroesophageal reflux disease)   . Hyperlipidemia   . Hypertension   . Obesity   . Sleep apnea 2011   using CPAP 6 cm H20  [facility] Sleep Study    Past Surgical History:  Procedure Laterality Date  . BREAST SURGERY  2003   negative biopsy  . CHOLECYSTECTOMY  1995  . TONSILLECTOMY AND ADENOIDECTOMY  2003  . TUBAL LIGATION      OB History  Gravida Para Term Preterm AB Living  1 1 1     1   SAB TAB Ectopic Multiple Live Births          1    # Outcome Date GA Lbr Len/2nd Weight Sex Delivery Anes PTL Lv  1 Term 2002   9 lb 1.8 oz (4.132 kg)  F Vag-Spont   LIV      Social History   Socioeconomic History  . Marital status: Married    Spouse name: None  . Number of children: None  . Years of education: None  . Highest education level: None  Social Needs  . Financial resource strain: None  . Food insecurity - worry: None  . Food insecurity - inability: None  . Transportation needs - medical: None  . Transportation needs - non-medical: None  Occupational History  . None  Tobacco Use  . Smoking status: Former Smoker    Start date: 05/17/2011  . Smokeless tobacco: Never Used  Substance and Sexual Activity  . Alcohol use: Yes    Alcohol/week: 1.8 oz    Types: 3 Glasses of wine per week    Comment: occas  . Drug use: No  . Sexual activity: Yes    Birth control/protection: Surgical  Other Topics Concern  . None  Social History Narrative  . None    Family History  Problem Relation Age of Onset  . Hypertension Mother   . Diabetes Mother   . Uterine cancer Mother   . Hyperlipidemia Father   . Breast cancer Neg  Hx   . Ovarian cancer Neg Hx   . Colon cancer Neg Hx      (Not in a hospital admission)  No Known Allergies  Review of Systems Constitutional: No recent fever/chills/sweats Respiratory: No recent cough/bronchitis Cardiovascular: No chest pain Gastrointestinal: No recent nausea/vomiting/diarrhea Genitourinary: No UTI symptoms Hematologic/lymphatic:No history of coagulopathy or recent blood thinner use    Objective:    BP 132/87   Pulse 67   Ht 5' 3"  (1.6 m)   Wt 218 lb 3.2 oz (99 kg)   LMP 01/20/2017 (Exact Date)   BMI 38.65 kg/m   General:   Normal  Skin:   normal  HEENT:  Normal  Neck:  Supple without Adenopathy or Thyromegaly  Lungs:   Heart:              Breasts:   Abdomen:  Pelvis:  M/S   Extremeties:  Neuro:    clear to auscultation bilaterally   Normal without murmur   Not Examined   soft, non-tender; bowel sounds normal; no masses,  no organomegaly   Exam deferred to  OR  No CVAT  Warm/Dry   Normal        11/21/2016 PELVIC:             External Genitalia: Normal             BUS: Normal             Vagina: Normal             Cervix: Normal; parous; without cervical motion tenderness             Uterus: Top normal size, shape,consistency, mobile, nontender             Adnexa: Normal; nonpalpable and nontender             RV: Normal external exam; normal sphincter tone; no rectal masses             Bladder: Nontender    Assessment:     1.  Symptomatic endometriosis 2.  Complex right ovarian cyst, persistent 3.  History of uterine fibroids   Plan:  LAVH BSO   Preop counseling: The patient is to undergo LAVH BO on 04/14/2017.  She is understanding of the planned procedure and is aware of and is accepting of all surgical risks which include but are not limited to bleeding, infection, pelvic organ injury with need for repair, blood clot disorders, anesthesia risk, etc.  All questions have been answered.  Informed consent is given.  Patient is ready willing to proceed with surgery as scheduled.  Brayton Mars, MD

## 2017-04-08 NOTE — Patient Instructions (Signed)
1.  Return for postop check 04/24/2017

## 2017-04-08 NOTE — H&P (Addendum)
Patient ID: Christina Stafford, female   DOB: 1970-04-13, 47 y.o.   MRN: [id number] Subjective: PREOPERATIVE HISTORY AND PHYSICAL  Date of surgery: 04/14/2017 Diagnosis: Symptomatic endometriosis; complex ovarian cyst, right; history of uterine fibroids Procedure: LAVH BSO   Patient is a 47 y.o. G1P1078fmale scheduled for LAVH BSO on 04/14/2017.  She desires management of symptomatic endometriosis and excision of a persistent complex right ovarian cyst.  Right ovarian cyst measures 4.5 cm and has features suspicious for endometrioma. Patient does have long history of menorrhagia and dysmenorrhea. She is status post a dermoid cyst removal in the past. She would like left ovarian conservation if possible if no significant pathology is identified in the left ovary. Patient has had one vaginal delivery of a 9+ pound baby.  Ultrasounds: 10/22/2016 ultrasound was notable for a 4.9 x 3.2 x 4.4 cm complex right ovarian cyst; left ovary is normal.  Uterus was enlarged measuring 9.8 x 4.2 x 5.1 cm and there was a possible 1.4 cm mass in the superior fundus 01/02/2017 ultrasound notable for persistent complex right ovarian cyst measuring 4.5 x 3.6 x 3.6 cm   Menstrual History: OB History    Gravida Para Term Preterm AB Living   1 1 1     1    SAB TAB Ectopic Multiple Live Births           1      Patient's last menstrual period was 01/20/2017 (exact date).    Past Medical History:  Diagnosis Date  . Carpal tunnel syndrome on both sides   . Depression   . GERD (gastroesophageal reflux disease)   . Hyperlipidemia   . Hypertension   . Obesity   . Sleep apnea 2011   using CPAP 6 cm H20  [facility] Sleep Study    Past Surgical History:  Procedure Laterality Date  . BREAST SURGERY  2003   negative biopsy  . CHOLECYSTECTOMY  1995  . TONSILLECTOMY AND ADENOIDECTOMY  2003  . TUBAL LIGATION      OB History  Gravida Para Term Preterm AB Living  1 1 1     1   SAB TAB Ectopic Multiple Live Births          1    # Outcome Date GA Lbr Len/2nd Weight Sex Delivery Anes PTL Lv  1 Term 2002   9 lb 1.8 oz (4.132 kg) F Vag-Spont   LIV      Social History   Socioeconomic History  . Marital status: Married    Spouse name: None  . Number of children: None  . Years of education: None  . Highest education level: None  Social Needs  . Financial resource strain: None  . Food insecurity - worry: None  . Food insecurity - inability: None  . Transportation needs - medical: None  . Transportation needs - non-medical: None  Occupational History  . None  Tobacco Use  . Smoking status: Former Smoker    Start date: 05/17/2011  . Smokeless tobacco: Never Used  Substance and Sexual Activity  . Alcohol use: Yes    Alcohol/week: 1.8 oz    Types: 3 Glasses of wine per week    Comment: occas  . Drug use: No  . Sexual activity: Yes    Birth control/protection: Surgical  Other Topics Concern  . None  Social History Narrative  . None    Family History  Problem Relation Age of Onset  . Hypertension Mother   . Diabetes Mother   .  Uterine cancer Mother   . Hyperlipidemia Father   . Breast cancer Neg Hx   . Ovarian cancer Neg Hx   . Colon cancer Neg Hx      (Not in a hospital admission)  No Known Allergies  Review of Systems Constitutional: No recent fever/chills/sweats Respiratory: No recent cough/bronchitis Cardiovascular: No chest pain Gastrointestinal: No recent nausea/vomiting/diarrhea Genitourinary: No UTI symptoms Hematologic/lymphatic:No history of coagulopathy or recent blood thinner use    Objective:    BP 132/87   Pulse 67   Ht 5' 3"  (1.6 m)   Wt 218 lb 3.2 oz (99 kg)   LMP 01/20/2017 (Exact Date)   BMI 38.65 kg/m   General:   Normal  Skin:   normal  HEENT:  Normal  Neck:  Supple without Adenopathy or Thyromegaly  Lungs:   Heart:              Breasts:   Abdomen:  Pelvis:  M/S   Extremeties:  Neuro:    clear to auscultation bilaterally   Normal without  murmur   Not Examined   soft, non-tender; bowel sounds normal; no masses,  no organomegaly   Exam deferred to OR  No CVAT  Warm/Dry   Normal        11/21/2016 PELVIC:             External Genitalia: Normal             BUS: Normal             Vagina: Normal             Cervix: Normal; parous; without cervical motion tenderness             Uterus: Top normal size, shape,consistency, mobile, nontender             Adnexa: Normal; nonpalpable and nontender             RV: Normal external exam; normal sphincter tone; no rectal masses             Bladder: Nontender    Assessment:     1.  Symptomatic endometriosis 2.  Complex right ovarian cyst, persistent 3.  History of uterine fibroids   Plan:  LAVH BSO   Preop counseling: The patient is to undergo LAVH BSO on 04/14/2017.  She is understanding of the planned procedure and is aware of and is accepting of all surgical risks which include but are not limited to bleeding, infection, pelvic organ injury with need for repair, blood clot disorders, anesthesia risk, etc.  All questions have been answered.  Informed consent is given.  Patient is ready willing to proceed with surgery as scheduled.  Brayton Mars, MD

## 2017-04-09 ENCOUNTER — Other Ambulatory Visit: Payer: Self-pay

## 2017-04-09 ENCOUNTER — Encounter
Admission: RE | Admit: 2017-04-09 | Discharge: 2017-04-09 | Disposition: A | Discharge status: Home / Self Care | Payer: BLUE CROSS/BLUE SHIELD | Source: Ambulatory Visit | Attending: Obstetrics and Gynecology | Admitting: Obstetrics and Gynecology

## 2017-04-09 HISTORY — DX: Other complications of anesthesia, initial encounter: T88.59XA

## 2017-04-09 HISTORY — DX: Family history of other specified conditions: Z84.89

## 2017-04-09 HISTORY — DX: Other specified postprocedural states: R11.2

## 2017-04-09 HISTORY — DX: Cardiac murmur, unspecified: R01.1

## 2017-04-09 HISTORY — DX: Adverse effect of unspecified anesthetic, initial encounter: T41.45XA

## 2017-04-09 HISTORY — DX: Dizziness and giddiness: R42

## 2017-04-09 HISTORY — DX: Other specified postprocedural states: Z98.890

## 2017-04-09 NOTE — Pre-Procedure Instructions (Signed)
Echocardiogram 2D complete8/25/2016 [place] Component Name Value Ref Range  LV Ejection Fraction (%) 55   Aortic Valve Stenosis Grade none   Aortic Valve Regurgitation Grade trivial   Aortic Valve Max Velocity (m/s) 1.5 m/sec   Aortic Valve Stenosis Mean Gradient (mmHg) 4.0 mmHg   Mitral Valve Stenosis Grade none   Mitral Valve Regurgitation Grade mild   Tricuspid Valve Regurgitation Grade mild   Tricuspid Valve Regurgitation Max Velocity (m/s) 2.6 m/sec   Right Ventricle Systolic Pressure (mmHg) 88.9 mmHg   LV End Diastolic Diameter (cm) 5.2 cm  LV End Systolic Diameter (cm) 3.5 cm  LV Septum Wall Thickness (cm) 0.79 cm  LV Posterior Wall Thickness (cm) 0.77 cm  Left Atrium Diameter (cm) 4 cm  Result Narrative  CARDIOLOGY Christina, Stafford [facility] [id number] [facility] Acct #: [id number] [address] [name], [place] [address] Date: 11/10/2014 01:34 PM  Adult Female Age: 47 yrs ECHOCARDIOGRAM REPORTOutpatient  STUDY:CHEST WALL TAPE:0000:00: 8:00:34JZ::PHXT ECHO:Yes DOPPLER:YesFILE:[id number]:[facility] ER F/U  COLOR:YesCONTRAST:No MACHINE:PhilipsHeight: 9 in  RV BIOPSY:No 3D:NoSOUND QLTY:Moderate Weight: 179 lb MEDIUM:None  BSA: 1.8 m2 ___________________________________________________________________________________________  HISTORY:Chest pain, Murmur REASON:Assess, LV function, Assess, Source of Murmur INDICATION:I20.9 Ischemic chest  pain  ___________________________________________________________________________________________ ECHOCARDIOGRAPHIC MEASUREMENTS 2D DIMENSIONS AORTA ValuesNormal RangeMAIN PAValuesNormal Range Annulus:nm* [2.1 - 2.5]PA Main:nm* [1.5 - 2.1] Aorta Sin:nm* [2.7 - 3.3] RIGHT VENTRICLE ST Junction:nm* [2.3 - 2.9]RV Base:nm* [ < 4.2] Asc.Aorta:nm* [2.3 - 3.1] RV Mid:nm* [ < 3.5]  LEFT VENTRICLERV Length:nm* [ < 8.6] LVIDd:5.2 cm[3.9 - 5.3] INFERIOR VENA CAVA LVIDs:3.5 cmMax. IVC:nm* [ <= 2.1]  FS:32.9 %[> 25]Min. IVC:nm* SWT:0.79 cm [0.5 - 0.9] ------------------ PWT:0.77 cm [0.5 - 0.9] nm* - not measured  LEFT ATRIUM LA Diam:4.0 cm[2.7 - 3.8] LA A4C Area:nm* [ < 20] LA Volume:nm* [22 - 52]  ___________________________________________________________________________________________ ECHOCARDIOGRAPHIC DESCRIPTIONS  AORTIC ROOT Size:Normal Dissection:No dissection  AORTIC VALVE Leaflets:Tricuspid Morphology:Normal Mobility:Fully mobile  LEFT VENTRICLE Size:NormalAnterior:Normal  Contraction:Normal Lateral:Normal Closest EF:>55% (Estimated)Septal:Normal  LV Masses:No Masses Apical:Normal  AVW:[id number]:SMOLMB Posterior:Normal Dias.FxClass:Normal  MITRAL VALVE Leaflets:NormalMobility:Fully mobile Morphology:THICKENED LEAFLET(S)  LEFT  ATRIUM Size:Normal LA Masses:No masses  IA Septum:Normal IAS  MAIN PA Size:Normal  PULMONIC VALVE Morphology:NormalMobility:Fully mobile  RIGHT VENTRICLE  RV Masses:No Masses Size:Normal  Free Wall:Normal Contraction:Normal  TRICUSPID VALVE Leaflets:NormalMobility:Fully mobile Morphology:Normal  RIGHT ATRIUM Size:MILDLY ENLARGED RA Other:None  RA Mass:No masses  PERICARDIUM  Fluid:No effusion  INFERIOR VENACAVA Size:Normal Normal respiratory collapse   ____________________________________________________________________ DOPPLER ECHO and OTHER SPECIAL PROCEDURES  Aortic:TRIVIAL ARNo AS 148.0 cm/sec peak vel 8.8 mmHg peak grad 4.0 mmHg mean grad2.5 cm^2 by DOPPLER   Mitral:MILD MR No MS 3.2 cm^2 by DOPPLER MV Inflow E Vel=75.2 cm/sec MV Annulus E'Vel=9.2 cm/sec E/E'Ratio=8.2  Tricuspid:MILD TR No TS 264.0 cm/sec peak TR vel32.9 mmHg peak RV pressure  Pulmonary:TRIVIAL PRNo PS     ___________________________________________________________________________________________ INTERPRETATION NORMAL LEFT VENTRICULAR SYSTOLIC FUNCTION WITH AN ESTIMATED EF = 60 % NORMAL RIGHT VENTRICULAR SYSTOLIC FUNCTION MILD-TO-MODERATE TRICUSPID AND MITRAL VALVE INSUFFICIENCY TRACE AORTIC VALVE INSUFFICIENCY NO VALVULAR STENOSIS MILD RA ENLARGEMENT   ___________________________________________________________________________________________ Electronically signed by: MD Serafina Royals on 11/10/2014 03:15 PM Performed By: Johnathan Hausen, [place], RVT Ordering Physician: Serafina Royals  ___________________________________________________________________________________________

## 2017-04-09 NOTE — Patient Instructions (Signed)
Your procedure is scheduled on: 04-14-17 MONDAY Report to Same Day Surgery 2nd floor medical mall [facility] Entrance-take elevator on left to 2nd floor.  Check in with surgery information desk.) To find out your arrival time please call [phone] between 1PM - 3PM on 04-11-17 FRIDAY  Remember: Instructions that are not followed completely may result in serious medical risk, up to and including death, or upon the discretion of your surgeon and anesthesiologist your surgery may need to be rescheduled.    _x___ 1. Do not eat food after midnight the night before your procedure. NO GUM OR CANDY AFTER MIDNIGHT.  You may drink clear liquids up to 2 hours before you are scheduled to arrive at the hospital for your procedure.  Do not drink clear liquids within 2 hours of your scheduled arrival to the hospital.  Clear liquids include  --Water or Apple juice without pulp  --Clear carbohydrate beverage such as ClearFast or Gatorade  --Black Coffee or Clear Tea (No milk, no creamers, do not add anything to the coffee or Tea      __x__ 2. No Alcohol for 24 hours before or after surgery.   __x__3. No Smoking for 24 prior to surgery.   ____  4. Bring all medications with you on the day of surgery if instructed.    __x__ 5. Notify your doctor if there is any change in your medical condition     (cold, fever, infections).     Do not wear jewelry, make-up, hairpins, clips or nail polish.  Do not wear lotions, powders, or perfumes. You may wear deodorant.  Do not shave 48 hours prior to surgery. Men may shave face and neck.  Do not bring valuables to the hospital.    [facility] is not responsible for any belongings or valuables.               Contacts, dentures or bridgework may not be worn into surgery.  Leave your suitcase in the car. After surgery it may be brought to your room.  For patients admitted to the hospital, discharge time is determined by your treatment team.   Patients  discharged the day of surgery will not be allowed to drive home.  You will need someone to drive you home and stay with you the night of your procedure.    Please read over the following fact sheets that you were given:   [facility] Preparing for Surgery and or MRSA Information   _x___ TAKE THE FOLLOWING MEDICATION THE MORNING OF SURGERY WITH A SMALL SIP OF WATER. These include:  1. AMLODIPINE  2. NEXIUM   3. TAKE AN EXTRA NEXIUM ON Sunday NIGHT BEFORE BED (04-13-17)  4. YOU MAY TAKE A XANAX AT HOME AM OF SURGERY IF NEEDED  5.  6.  ____Fleets enema or Magnesium Citrate as directed.   _x___ Use CHG Soap or sage wipes as directed on instruction sheet   ____ Use inhalers on the day of surgery and bring to hospital day of surgery  ____ Stop Metformin and Janumet 2 days prior to surgery.    ____ Take 1/2 of usual insulin dose the night before surgery and none on the morning surgery.   ____ Follow recommendations from Cardiologist, Pulmonologist or PCP regarding stopping Aspirin, Coumadin, Plavix ,Eliquis, Effient, or Pradaxa, and Pletal.  X____Stop Anti-inflammatories such as Advil, Aleve, IBUPROFEN , Motrin, Naproxen, Naprosyn, Goodies powders or aspirin products. OK to take Tylenol    ____ Stop supplements  until after surgery.     _X___ Bring C-Pap to the hospital.

## 2017-04-10 ENCOUNTER — Encounter
Admission: RE | Admit: 2017-04-10 | Discharge: 2017-04-10 | Disposition: A | Discharge status: Home / Self Care | Payer: BLUE CROSS/BLUE SHIELD | Source: Ambulatory Visit | Attending: Obstetrics and Gynecology | Admitting: Obstetrics and Gynecology

## 2017-04-10 DIAGNOSIS — Z01818 Encounter for other preprocedural examination: Secondary | ICD-10-CM | POA: Diagnosis not present

## 2017-04-10 DIAGNOSIS — R9431 Abnormal electrocardiogram [ECG] [EKG]: Secondary | ICD-10-CM | POA: Insufficient documentation

## 2017-04-10 LAB — CBC WITH DIFFERENTIAL/PLATELET
Basophils Absolute: 0 10*3/uL (ref 0–0.1)
Basophils Relative: 1 %
Eosinophils Absolute: 0.1 10*3/uL (ref 0–0.7)
Eosinophils Relative: 2 %
HCT: 40.2 % (ref 35.0–47.0)
Hemoglobin: 13.9 g/dL (ref 12.0–16.0)
Lymphocytes Relative: 34 %
Lymphs Abs: 2 10*3/uL (ref 1.0–3.6)
MCH: 31.5 pg (ref 26.0–34.0)
MCHC: 34.4 g/dL (ref 32.0–36.0)
MCV: 91.5 fL (ref 80.0–100.0)
Monocytes Absolute: 0.4 10*3/uL (ref 0.2–0.9)
Monocytes Relative: 7 %
Neutro Abs: 3.4 10*3/uL (ref 1.4–6.5)
Neutrophils Relative %: 56 %
Platelets: 251 10*3/uL (ref 150–440)
RBC: 4.4 MIL/uL (ref 3.80–5.20)
RDW: 14 % (ref 11.5–14.5)
WBC: 5.9 10*3/uL (ref 3.6–11.0)

## 2017-04-10 LAB — TYPE AND SCREEN
ABO/RH(D): A POS
Antibody Screen: NEGATIVE

## 2017-04-10 LAB — RAPID HIV SCREEN (HIV 1/2 AB+AG)
HIV 1/2 Antibodies: NONREACTIVE
HIV-1 P24 Antigen - HIV24: NONREACTIVE

## 2017-04-11 ENCOUNTER — Telehealth: Payer: Self-pay | Admitting: Obstetrics and Gynecology

## 2017-04-11 LAB — RPR: RPR Ser Ql: NONREACTIVE

## 2017-04-13 ENCOUNTER — Other Ambulatory Visit: Payer: Self-pay | Admitting: Internal Medicine

## 2017-04-13 MED ORDER — CEFAZOLIN SODIUM-DEXTROSE 2-4 GM/100ML-% IV SOLN: 2.0000 g | INTRAVENOUS | Status: AC

## 2017-04-14 ENCOUNTER — Ambulatory Visit
Admission: RE | Admit: 2017-04-14 | Discharge: 2017-04-14 | Disposition: A | Discharge status: Home / Self Care | Payer: BLUE CROSS/BLUE SHIELD | Source: Ambulatory Visit | Attending: Obstetrics and Gynecology | Admitting: Obstetrics and Gynecology

## 2017-04-16 NOTE — H&P (Signed)
Surgical procedure is rescheduled until 04/28/2017 due to OR delays.

## 2017-04-16 NOTE — H&P (View-Only) (Signed)
Surgical procedure is rescheduled until 04/28/2017 due to OR delays.

## 2017-04-18 ENCOUNTER — Ambulatory Visit: Payer: BLUE CROSS/BLUE SHIELD | Admitting: Family Medicine

## 2017-04-18 ENCOUNTER — Encounter: Payer: Self-pay | Admitting: Family Medicine

## 2017-04-18 ENCOUNTER — Ambulatory Visit: Payer: Self-pay

## 2017-04-18 VITALS — BP 128/76 | HR 71 | Temp 98.1°F | Ht 63.0 in | Wt 216.0 lb

## 2017-04-18 DIAGNOSIS — M79632 Pain in left forearm: Secondary | ICD-10-CM

## 2017-04-18 NOTE — Patient Instructions (Addendum)
There appears to be a blood vessel change on the ultrasound  I would watch it until you can be seen by vascular surgery  If the sensation changes in your hand or it feels cold or turns white then that is when to be seen in the emergency room.  Try not to wear your brace on your left arm if you can avoid it.

## 2017-04-18 NOTE — Telephone Encounter (Signed)
Patient called in with c/o "swollen knot on forearm." She said "yesterday, I noticed a knot on my inner arm below the elbow and it was about a gumball size, sore to touch. Today, it is larger about the size of a large gumball, tender to touch, no redness and it is under my skin, no pain unless I touch it and it's a 1 on the scale of 1-10." She denies itching, fever. According to protocol, see PCP within 24 hours, no availability with provider or current practice, offered to go to another [place] office, she agreed, appointment made for today at 1620 with Dr. Raeford Razor at [place], care advice given, she verbalized understanding.   Reason for Disposition . [1] Swelling is painful to touch AND [2] no fever  Answer Assessment - Initial Assessment Questions 1. APPEARANCE of SWELLING: "What does it look like?" (e.g., lymph node, insect bite, mole)     Sore knot that's sore on the inside left forearm, close to elbow 2. SIZE: "How large is the swelling?" (inches, cm or compare to coins)     Large gumball, yesterday it was smaller 3. LOCATION: "Where is the swelling located?"     Left inner forearm close to elbow, swollen down from elbow to mid arm 4. ONSET: "When did the swelling start?"     Yesterday afternoon it was noticed 5. PAIN: "Is it painful?" If so, ask: "How much?"     Probably 1-only noticed when touched, not painful otherwise 6. ITCH: "Does it itch?" If so, ask: "How much?"     No itching 7. CAUSE: "What do you think caused the swelling?"     Unsure 8. OTHER SYMPTOMS: "Do you have any other symptoms?" (e.g., fever)     Denies  Protocols used: SKIN LUMP OR LOCALIZED SWELLING-A-AH

## 2017-04-18 NOTE — Progress Notes (Signed)
Christina Stafford - 47 y.o. female MRN [id number]  Date of birth: 1970/08/31  SUBJECTIVE:  Including CC & ROS.  Chief Complaint  Patient presents with  . Knot on arm    Christina Stafford is a 47 y.o. female that is presenting with a knot on left forearm. She noticed this yesterday morning. Swelling and tenderness present. Denies injury. She has been applying ice with no improvement. She works at [employer] and denies any prior problem. She did fall recently but didn't hit her arm. The pain is localized. Pain is mild to moderate. No numbness or tingling.    Review of Systems  Constitutional: Negative for fever.  HENT: Negative for sinus pain.   Respiratory: Negative for shortness of breath.   Cardiovascular: Negative for chest pain.  Gastrointestinal: Negative for abdominal pain.  Genitourinary: Negative for frequency.  Musculoskeletal: Negative for back pain.  Skin: Negative for color change.  Neurological: Negative for weakness.  Hematological: Negative for adenopathy.  Psychiatric/Behavioral: Negative for agitation.    HISTORY: Past Medical, Surgical, Social, and Family History Reviewed & Updated per EMR.   Pertinent Historical Findings include:  Past Medical History:  Diagnosis Date  . Carpal tunnel syndrome on both sides   . Complication of anesthesia   . Depression   . Family history of adverse reaction to anesthesia    N/V  . GERD (gastroesophageal reflux disease)   . Heart murmur    DURING PREGNANCY  . Hyperlipidemia   . Hypertension   . Obesity   . PONV (postoperative nausea and vomiting)   . Sleep apnea 2011   using CPAP 6 cm H20  [facility] Sleep Study  . Vertigo     Past Surgical History:  Procedure Laterality Date  . BREAST SURGERY  2003   negative biopsy  . CHOLECYSTECTOMY  1995  . OVARIAN CYST REMOVAL    . TONSILLECTOMY AND ADENOIDECTOMY  2003  . TUBAL LIGATION      No Known Allergies  Family History  Problem Relation Age of Onset  .  Hypertension Mother   . Diabetes Mother   . Uterine cancer Mother   . Hyperlipidemia Father   . Breast cancer Neg Hx   . Ovarian cancer Neg Hx   . Colon cancer Neg Hx      Social History   Socioeconomic History  . Marital status: Married    Spouse name: Not on file  . Number of children: Not on file  . Years of education: Not on file  . Highest education level: Not on file  Social Needs  . Financial resource strain: Not on file  . Food insecurity - worry: Not on file  . Food insecurity - inability: Not on file  . Transportation needs - medical: Not on file  . Transportation needs - non-medical: Not on file  Occupational History  . Not on file  Tobacco Use  . Smoking status: Former Smoker    Packs/day: 0.25    Years: 10.00    Pack years: 2.50    Types: Cigarettes    Start date: 05/17/2011  . Smokeless tobacco: Never Used  Substance and Sexual Activity  . Alcohol use: Yes    Alcohol/week: 1.8 oz    Types: 3 Glasses of wine per week    Comment: occas  . Drug use: No  . Sexual activity: Yes    Birth control/protection: Surgical  Other Topics Concern  . Not on file  Social History Narrative  .  Not on file     PHYSICAL EXAM:  VS: BP 128/76 (BP Location: Left Arm, Patient Position: Sitting, Cuff Size: Normal)   Pulse 71   Temp 98.1 F (36.7 C) (Oral)   Ht 5' 3"  (1.6 m)   Wt 216 lb (98 kg)   SpO2 98%   BMI 38.26 kg/m  Physical Exam Gen: NAD, alert, cooperative with exam, well-appearing ENT: normal lips, normal nasal mucosa,  Eye: normal EOM, normal conjunctiva and lids CV:  no edema, +2 pedal pulses   Resp: no accessory muscle use, non-labored,  GI: no masses or tenderness, no hernia  Skin: no rashes, no areas of induration  Neuro: normal tone, normal sensation to touch Psych:  normal insight, alert and oriented MSK:  Left forearm:  Anterior aspect with pulses present, outpouching in this area  TTP overlying the defect  Normal elbow ROM  Normal wrist  ROM  Normal strength to resistance in UE and wrist  Normal finger adduction and abduction  Neurovascularly intact   Limited ultrasound: left forearm:  There appears to be a vascular abnormality of the area she describes of the defect. There is no fascial protrusion or muscle involvement.  No abnormal mass in the subcutaneous tissue   Summary: vascular abnormality of the left forearm. 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 Possible for AVM vs pseudoaneurysm.  - referral to vascular surgery  - counseled on care  - f/u PRN

## 2017-04-20 DIAGNOSIS — M79632 Pain in left forearm: Secondary | ICD-10-CM | POA: Insufficient documentation

## 2017-04-20 NOTE — Assessment & Plan Note (Signed)
Findings are vascular on [place]. Possible for AVM vs pseudoaneurysm.  - referral to vascular surgery  - counseled on care  - f/u PRN

## 2017-04-21 ENCOUNTER — Telehealth: Payer: Self-pay | Admitting: Internal Medicine

## 2017-04-21 ENCOUNTER — Telehealth: Payer: Self-pay

## 2017-04-21 NOTE — Telephone Encounter (Signed)
Copied from [place] [id number]. Topic: Referral - Request >> Apr 21, 2017  9:37 AM Cecelia Byars, NT wrote: Reason for NLW:[id number] would like a referral to see a vascular surgeon this week if possible  there is no time preference please advise [phone]

## 2017-04-21 NOTE — Telephone Encounter (Addendum)
.  lmLeft message to return call patient has appointment 04/23/17 with vascular surgeon. Does she still need referral?

## 2017-04-21 NOTE — Telephone Encounter (Signed)
Copied from [place] [id number]. Topic: Referral - Request >> Apr 21, 2017  9:37 AM Cecelia Byars, NT wrote: Reason for JLT:[id number] would like a referral to see a vascular surgeon this week if possible  there is no time preference please advise [phone]

## 2017-04-22 ENCOUNTER — Encounter: Payer: BLUE CROSS/BLUE SHIELD | Admitting: Obstetrics and Gynecology

## 2017-04-22 IMAGING — CR DG CHEST 2V
2 series · 2 of 2 positions shown · non-contrast
Comparison: 10/11/2014

CLINICAL DATA: Dull chest pain starting [REDACTED].

EXAM:
CHEST  2 VIEW

[chest pa]
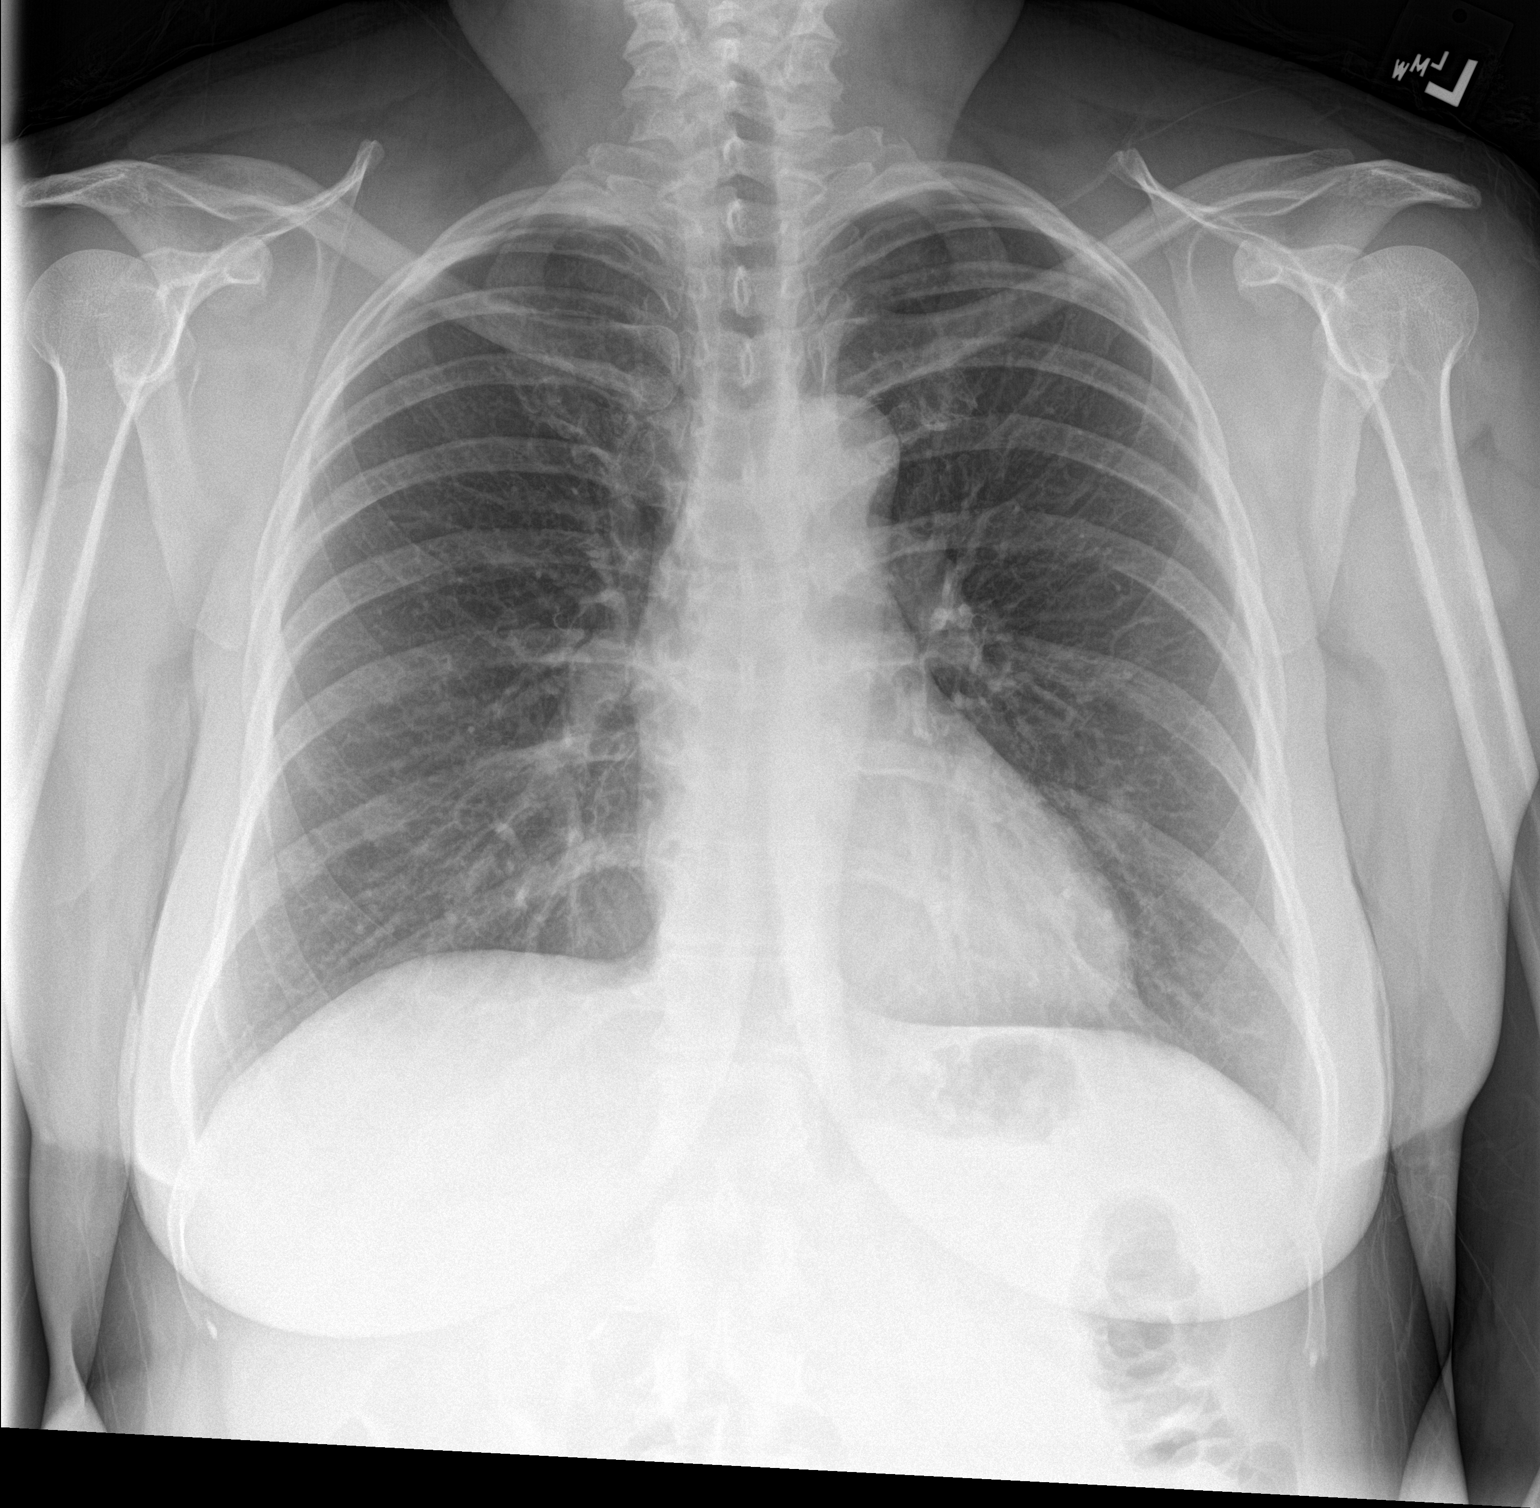

[chest lat]
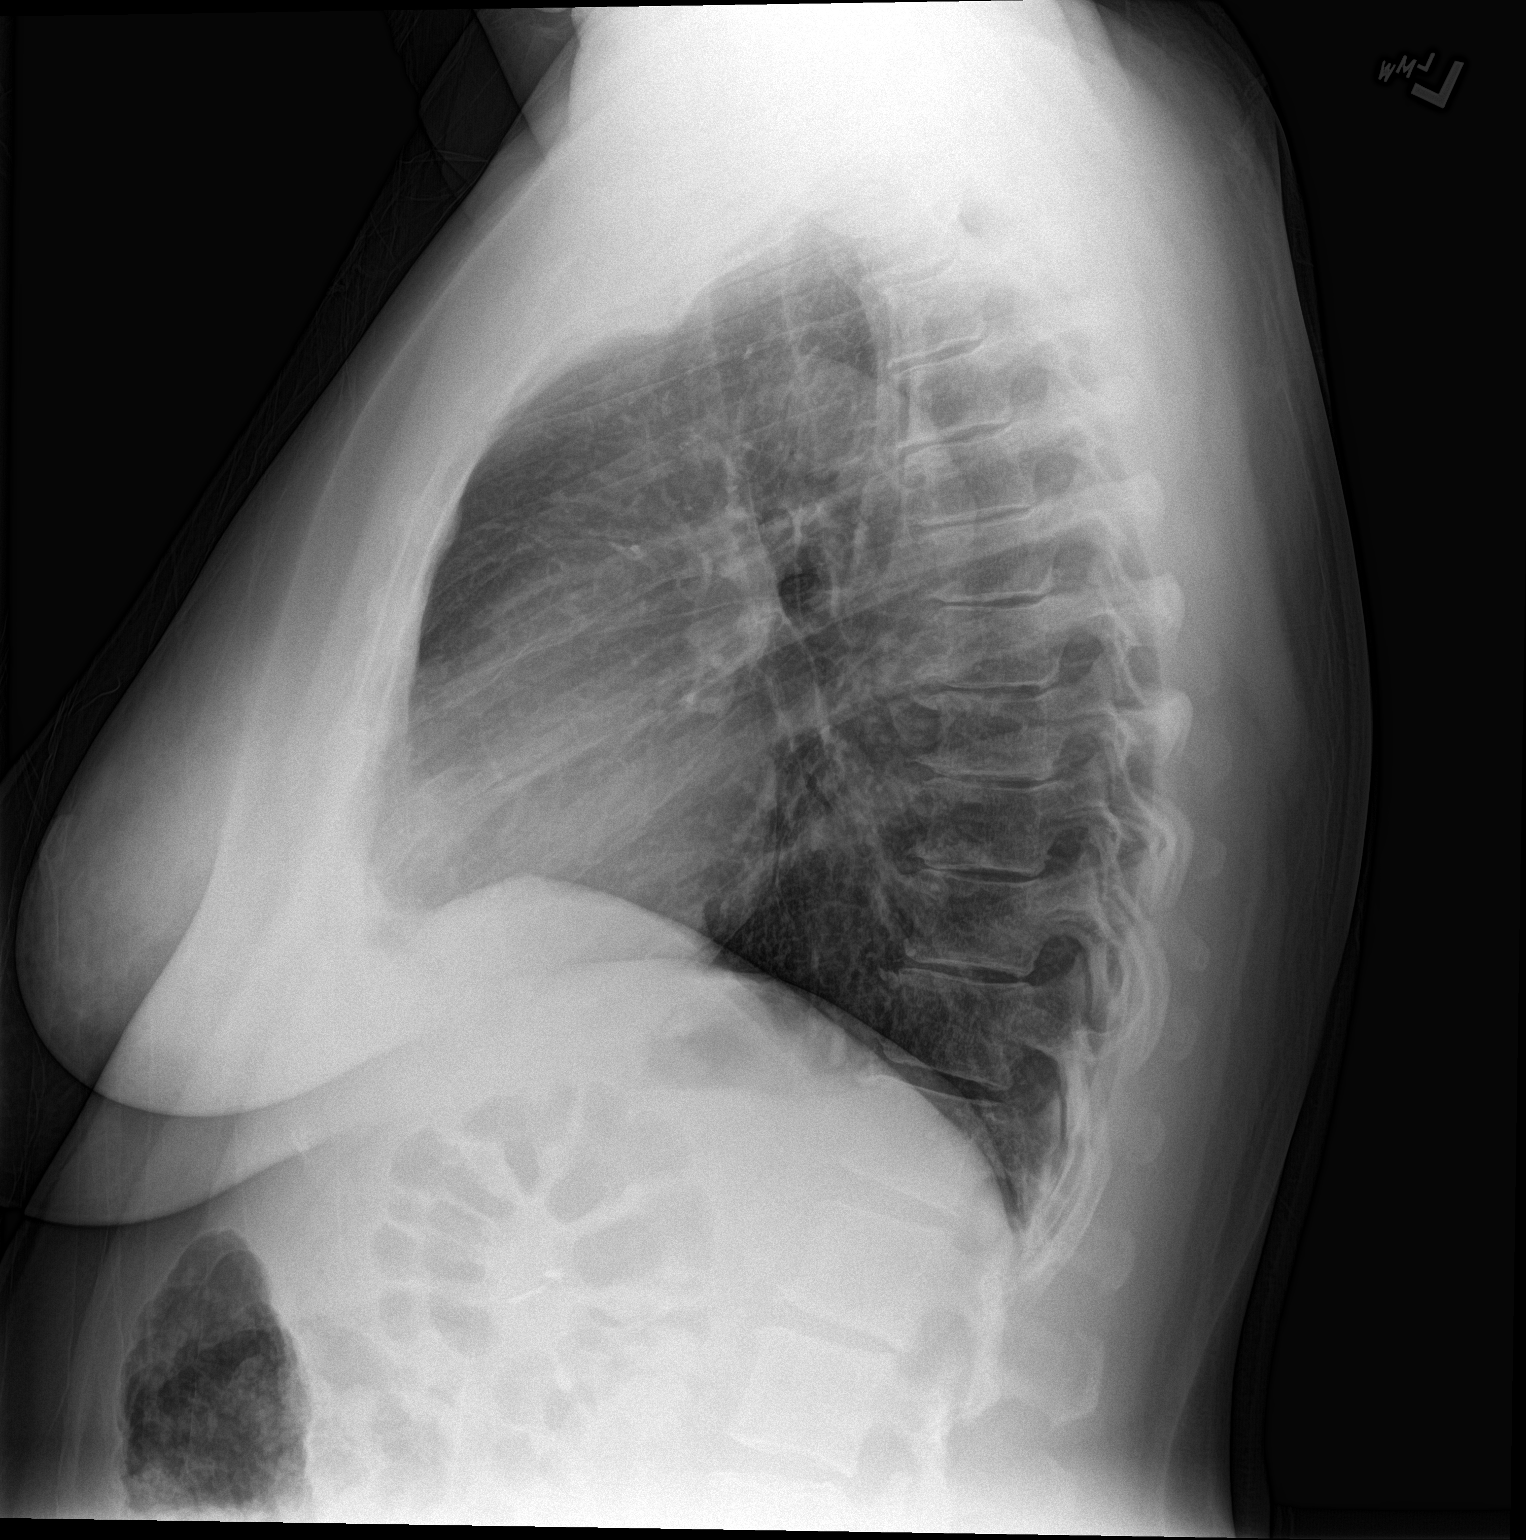

[2 of 2 positions shown; findings below may reference images not displayed]

FINDINGS: The lungs appear clear.  Cardiac and mediastinal contours normal.

No pleural effusion identified.

Mild thoracic spondylosis.
IMPRESSION: 1. No acute findings.
2. Mild thoracic spondylosis.

## 2017-04-23 ENCOUNTER — Encounter (INDEPENDENT_AMBULATORY_CARE_PROVIDER_SITE_OTHER): Payer: Self-pay | Admitting: Vascular Surgery

## 2017-04-23 ENCOUNTER — Ambulatory Visit (INDEPENDENT_AMBULATORY_CARE_PROVIDER_SITE_OTHER): Payer: BLUE CROSS/BLUE SHIELD | Admitting: Vascular Surgery

## 2017-04-23 VITALS — BP 145/99 | HR 116 | Resp 15 | Ht 63.0 in | Wt 216.0 lb

## 2017-04-23 DIAGNOSIS — E785 Hyperlipidemia, unspecified: Secondary | ICD-10-CM | POA: Diagnosis not present

## 2017-04-23 DIAGNOSIS — I10 Essential (primary) hypertension: Secondary | ICD-10-CM

## 2017-04-23 DIAGNOSIS — M79632 Pain in left forearm: Secondary | ICD-10-CM

## 2017-04-23 NOTE — Progress Notes (Signed)
Subjective:    Patient ID: Christina Stafford, female    DOB: 1970/09/08, 47 y.o.   MRN: [id number] Chief Complaint  Patient presents with  . New Patient (Initial Visit)    Left forearm pain   Presents as a new patient referred by Dr. 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  The patient endorses a history of an area to the lateral aspect of her left forearm which became acutely tender last Thursday.  This "tenderness" worsened over night into Friday.  Her symptoms have slowly improved however there is still a "lump" located to the area.  The patient denies any recent surgery or trauma to the area.  Dr. Raeford Razor did an ultrasound in the office who feels the area is a pseudoaneurysm versus an arteriovenous malformation.  The patient denies any previous left upper extremity pain or ulceration.  The patient denies any issues with motor or sensory function to left upper extremity.  The patient is scheduled to undergo a hysterectomy this coming Monday.  The patient also feels that she is getting the flu today.  The patient denies any fever however does feel slightly nauseous.   Review of Systems  Constitutional: Negative.   HENT: Negative.   Eyes: Negative.   Respiratory: Negative.   Cardiovascular:       Left upper forearm pain and swelling  Gastrointestinal: Negative.   Endocrine: Negative.   Genitourinary: Negative.   Musculoskeletal: Negative.   Skin: Negative.   Allergic/Immunologic: Negative.   Neurological: Negative.   Hematological: Negative.   Psychiatric/Behavioral: Negative.       Objective:   Physical Exam  Constitutional: She is oriented to person, place, and time. She appears well-developed and well-nourished. No distress.  HENT:  Head: Normocephalic and atraumatic.  Eyes: Conjunctivae are normal. Pupils are equal, round, and reactive to light.  Neck: Normal range of motion.  Cardiovascular: Normal rate, regular rhythm, normal heart  sounds and intact distal pulses.  Pulses:      Radial pulses are 2+ on the right side, and 2+ on the left side.  Left Forearm: 3cm x 3cm tender soft mobile mass. No bruit noted.  No vascular compromise to the arm.  No surrounding erythema.  No cellulitis.  No edema.  Pulmonary/Chest: Effort normal and breath sounds normal.  Neurological: She is alert and oriented to person, place, and time.  Skin: Skin is warm and dry. She is not diaphoretic.  Psychiatric: She has a normal mood and affect. Her behavior is normal. Judgment and thought content normal.  Vitals reviewed.  BP (!) 145/99 (BP Location: Right Arm, Patient Position: Sitting)   Pulse (!) 116   Resp 15   Ht 5' 3"  (1.6 m)   Wt 216 lb (98 kg)   BMI 38.26 kg/m   Past Medical History:  Diagnosis Date  . Carpal tunnel syndrome on both sides   . Complication of anesthesia   . Depression   . Family history of adverse reaction to anesthesia    N/V  . GERD (gastroesophageal reflux disease)   . Heart murmur    DURING PREGNANCY  . Hyperlipidemia   . Hypertension   . Obesity   . PONV (postoperative nausea and vomiting)   . Sleep apnea 2011   using CPAP 6 cm H20  [facility] Sleep Study  . Vertigo    Social History   Socioeconomic History  . Marital status: Married    Spouse name: Not on file  .  Number of children: Not on file  . Years of education: Not on file  . Highest education level: Not on file  Social Needs  . Financial resource strain: Not on file  . Food insecurity - worry: Not on file  . Food insecurity - inability: Not on file  . Transportation needs - medical: Not on file  . Transportation needs - non-medical: Not on file  Occupational History  . Not on file  Tobacco Use  . Smoking status: Former Smoker    Packs/day: 0.25    Years: 10.00    Pack years: 2.50    Types: Cigarettes    Start date: 05/17/2011  . Smokeless tobacco: Never Used  Substance and Sexual Activity  . Alcohol use: Yes    Alcohol/week: 1.8 oz     Types: 3 Glasses of wine per week    Comment: occas  . Drug use: No  . Sexual activity: Yes    Birth control/protection: Surgical  Other Topics Concern  . Not on file  Social History Narrative  . Not on file   Past Surgical History:  Procedure Laterality Date  . BREAST SURGERY  2003   negative biopsy  . CHOLECYSTECTOMY  1995  . OVARIAN CYST REMOVAL    . TONSILLECTOMY AND ADENOIDECTOMY  2003  . TUBAL LIGATION     Family History  Problem Relation Age of Onset  . Hypertension Mother   . Diabetes Mother   . Uterine cancer Mother   . Hyperlipidemia Father   . Breast cancer Neg Hx   . Ovarian cancer Neg Hx   . Colon cancer Neg Hx    No Known Allergies     Assessment & Plan:  Presents as a new patient referred by Dr. 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  The patient endorses a history of an area to the lateral aspect of her left forearm which became acutely tender last Thursday.  This "tenderness" worsened over night into Friday.  Her symptoms have slowly improved however there is still a "lump" located to the area.  The patient denies any recent surgery or trauma to the area.  Dr. Raeford Razor did an ultrasound in the office who feels the area is a pseudoaneurysm versus an arteriovenous malformation.  The patient denies any previous left upper extremity pain or ulceration.  The patient denies any issues with motor or sensory function to left upper extremity.  The patient is scheduled to undergo a hysterectomy this coming Monday.  The patient also feels that she is getting the flu today.  The patient denies any fever however does feel slightly nauseous.  1. Left forearm pain - New Patient presents with a newly formed tender swelling to the left forearm Ultrasound performed in her primary care office feels it is vascular in origin.  Pseudoaneurysm versus AVM. There is no acute vascular compromise to the left upper extremity Recommend a left upper  extremity angiogram with possible intervention to assess the patient's anatomy and the pathophysiology found at that time.  At this time, the patient and her husband would like to think about it.  If they decide to move forward there will call our office.  2. Hyperlipidemia, unspecified hyperlipidemia type - Stable Encouraged good control as its slows the progression of atherosclerotic disease  3. Essential hypertension - Stable Encouraged good control as its slows the progression of atherosclerotic disease  Current Outpatient Medications on File Prior to Visit  Medication Sig Dispense Refill  .  ALPRAZolam (XANAX) 0.5 MG tablet Take 1 tablet (0.5 mg total) by mouth daily as needed for anxiety (or panic attacks). 30 tablet 2  . amLODipine (NORVASC) 2.5 MG tablet TAKE 1 TABLET(2.5 MG) BY MOUTH DAILY 90 tablet 1  . cetirizine (ZYRTEC) 10 MG tablet Take 10 mg by mouth daily.     Marland Kitchen esomeprazole (NEXIUM) 20 MG capsule Take 20 mg by mouth every morning.     Marland Kitchen ibuprofen (ADVIL,MOTRIN) 200 MG tablet Take 400 mg by mouth every 8 (eight) hours as needed for headache or mild pain.    . metoprolol succinate (TOPROL-XL) 25 MG 24 hr tablet TAKE 1 TABLET(25 MG) BY MOUTH DAILY (Patient taking differently: TAKE 1 TABLET(25 MG) BY MOUTH DAILY-EVENING) 90 tablet 0  . sertraline (ZOLOFT) 50 MG tablet TAKE 1 AND 1/2 TABLETS(75 MG) BY MOUTH DAILY (Patient taking differently: TAKE 1 AND 1/2 TABLETS(75 MG) BY MOUTH DAILY-EVENING) 135 tablet 0  . tetrahydrozoline 0.05 % ophthalmic solution Place 2 drops into both eyes as needed (dry eyes).     No current facility-administered medications on file prior to visit.    There are no Patient Instructions on file for this visit. Return if symptoms worsen or fail to improve.  Delan Ksiazek A Leathie Weich, PA-C

## 2017-04-25 ENCOUNTER — Encounter (INDEPENDENT_AMBULATORY_CARE_PROVIDER_SITE_OTHER): Payer: Self-pay | Admitting: Vascular Surgery

## 2017-04-28 ENCOUNTER — Encounter
Admission: RE | Disposition: A | Discharge status: Home / Self Care | Payer: Self-pay | Source: Ambulatory Visit | Attending: Obstetrics and Gynecology

## 2017-04-28 ENCOUNTER — Encounter: Payer: Self-pay | Admitting: *Deleted

## 2017-04-28 ENCOUNTER — Ambulatory Visit: Payer: BLUE CROSS/BLUE SHIELD | Admitting: Anesthesiology

## 2017-04-28 ENCOUNTER — Other Ambulatory Visit: Payer: Self-pay

## 2017-04-28 ENCOUNTER — Observation Stay
Admission: RE | Admit: 2017-04-28 | Discharge: 2017-04-29 | Disposition: A | Discharge status: Home / Self Care | Payer: BLUE CROSS/BLUE SHIELD | Source: Ambulatory Visit | Attending: Obstetrics and Gynecology | Admitting: Obstetrics and Gynecology

## 2017-04-28 DIAGNOSIS — I10 Essential (primary) hypertension: Secondary | ICD-10-CM | POA: Diagnosis not present

## 2017-04-28 DIAGNOSIS — N809 Endometriosis, unspecified: Secondary | ICD-10-CM | POA: Diagnosis present

## 2017-04-28 DIAGNOSIS — K219 Gastro-esophageal reflux disease without esophagitis: Secondary | ICD-10-CM | POA: Insufficient documentation

## 2017-04-28 DIAGNOSIS — N72 Inflammatory disease of cervix uteri: Secondary | ICD-10-CM | POA: Diagnosis not present

## 2017-04-28 DIAGNOSIS — I721 Aneurysm of artery of upper extremity: Secondary | ICD-10-CM | POA: Diagnosis not present

## 2017-04-28 DIAGNOSIS — Z4889 Encounter for other specified surgical aftercare: Secondary | ICD-10-CM

## 2017-04-28 DIAGNOSIS — M79602 Pain in left arm: Secondary | ICD-10-CM

## 2017-04-28 DIAGNOSIS — F329 Major depressive disorder, single episode, unspecified: Secondary | ICD-10-CM | POA: Insufficient documentation

## 2017-04-28 DIAGNOSIS — Z79899 Other long term (current) drug therapy: Secondary | ICD-10-CM | POA: Diagnosis not present

## 2017-04-28 DIAGNOSIS — Z87891 Personal history of nicotine dependence: Secondary | ICD-10-CM | POA: Diagnosis not present

## 2017-04-28 DIAGNOSIS — F419 Anxiety disorder, unspecified: Secondary | ICD-10-CM | POA: Insufficient documentation

## 2017-04-28 DIAGNOSIS — D270 Benign neoplasm of right ovary: Secondary | ICD-10-CM | POA: Insufficient documentation

## 2017-04-28 DIAGNOSIS — G4730 Sleep apnea, unspecified: Secondary | ICD-10-CM | POA: Insufficient documentation

## 2017-04-28 DIAGNOSIS — Z6838 Body mass index (BMI) 38.0-38.9, adult: Secondary | ICD-10-CM | POA: Diagnosis not present

## 2017-04-28 DIAGNOSIS — E669 Obesity, unspecified: Secondary | ICD-10-CM | POA: Diagnosis not present

## 2017-04-28 DIAGNOSIS — D251 Intramural leiomyoma of uterus: Secondary | ICD-10-CM | POA: Diagnosis not present

## 2017-04-28 DIAGNOSIS — Z9851 Tubal ligation status: Secondary | ICD-10-CM | POA: Diagnosis not present

## 2017-04-28 DIAGNOSIS — N888 Other specified noninflammatory disorders of cervix uteri: Secondary | ICD-10-CM | POA: Insufficient documentation

## 2017-04-28 DIAGNOSIS — Z90710 Acquired absence of both cervix and uterus: Secondary | ICD-10-CM

## 2017-04-28 DIAGNOSIS — N8302 Follicular cyst of left ovary: Secondary | ICD-10-CM | POA: Insufficient documentation

## 2017-04-28 HISTORY — PX: LAPAROSCOPIC VAGINAL HYSTERECTOMY WITH SALPINGO OOPHORECTOMY: SHX6681

## 2017-04-28 LAB — POCT PREGNANCY, URINE: PREG TEST UR: NEGATIVE

## 2017-04-28 LAB — ABO/RH: ABO/RH(D): A POS

## 2017-04-28 SURGERY — HYSTERECTOMY, VAGINAL, LAPAROSCOPY-ASSISTED, WITH SALPINGO-OOPHORECTOMY
Anesthesia: General | Site: Abdomen | Laterality: Bilateral | Wound class: Clean Contaminated

## 2017-04-28 MED ORDER — DEXAMETHASONE SODIUM PHOSPHATE 10 MG/ML IJ SOLN
INTRAMUSCULAR | Status: AC
  Filled 2017-04-28: qty 1

## 2017-04-28 MED ORDER — MORPHINE SULFATE (PF) 2 MG/ML IV SOLN
1.0000 mg | INTRAVENOUS | Status: DC | PRN
  Administered 2017-04-28 – 2017-04-29 (×8): 2 mg via INTRAVENOUS
  Filled 2017-04-28 (×8): qty 1

## 2017-04-28 MED ORDER — SUGAMMADEX SODIUM 200 MG/2ML IV SOLN
INTRAVENOUS | Status: AC
  Filled 2017-04-28: qty 2

## 2017-04-28 MED ORDER — LIDOCAINE HCL (PF) 2 % IJ SOLN
INTRAMUSCULAR | Status: AC
  Filled 2017-04-28: qty 10

## 2017-04-28 MED ORDER — GLYCOPYRROLATE 0.2 MG/ML IJ SOLN
INTRAMUSCULAR | Status: AC
  Filled 2017-04-28: qty 1

## 2017-04-28 MED ORDER — FENTANYL CITRATE (PF) 100 MCG/2ML IJ SOLN
INTRAMUSCULAR | Status: AC
  Filled 2017-04-28: qty 2

## 2017-04-28 MED ORDER — CEFAZOLIN SODIUM-DEXTROSE 2-3 GM-%(50ML) IV SOLR
INTRAVENOUS | Status: DC | PRN
  Administered 2017-04-28: 2 g via INTRAVENOUS

## 2017-04-28 MED ORDER — LACTATED RINGERS IV SOLN
INTRAVENOUS | Status: DC
  Administered 2017-04-28 (×3): via INTRAVENOUS

## 2017-04-28 MED ORDER — ROCURONIUM BROMIDE 50 MG/5ML IV SOLN
INTRAVENOUS | Status: AC
  Filled 2017-04-28: qty 1

## 2017-04-28 MED ORDER — ONDANSETRON HCL 4 MG/2ML IJ SOLN
INTRAMUSCULAR | Status: DC | PRN
  Administered 2017-04-28: 4 mg via INTRAVENOUS

## 2017-04-28 MED ORDER — PROPOFOL 10 MG/ML IV BOLUS
INTRAVENOUS | Status: DC | PRN
  Administered 2017-04-28: 160 mg via INTRAVENOUS

## 2017-04-28 MED ORDER — KETOROLAC TROMETHAMINE 30 MG/ML IJ SOLN: 30.0000 mg | Freq: Four times a day (QID) | INTRAMUSCULAR | Status: DC

## 2017-04-28 MED ORDER — FENTANYL CITRATE (PF) 100 MCG/2ML IJ SOLN
INTRAMUSCULAR | Status: AC
  Administered 2017-04-28: 25 ug via INTRAVENOUS
  Filled 2017-04-28: qty 2

## 2017-04-28 MED ORDER — PHENYLEPHRINE HCL 10 MG/ML IJ SOLN
INTRAMUSCULAR | Status: AC
  Filled 2017-04-28: qty 1

## 2017-04-28 MED ORDER — LACTATED RINGERS IV SOLN
INTRAVENOUS | Status: DC
  Administered 2017-04-28 – 2017-04-29 (×2): via INTRAVENOUS

## 2017-04-28 MED ORDER — MIDAZOLAM HCL 2 MG/2ML IJ SOLN
INTRAMUSCULAR | Status: AC
  Filled 2017-04-28: qty 2

## 2017-04-28 MED ORDER — LACTATED RINGERS IV SOLN: INTRAVENOUS | Status: DC

## 2017-04-28 MED ORDER — FLUORESCEIN SODIUM 10 % IV SOLN
INTRAVENOUS | Status: AC
  Filled 2017-04-28: qty 5

## 2017-04-28 MED ORDER — PROPOFOL 10 MG/ML IV BOLUS
INTRAVENOUS | Status: AC
  Filled 2017-04-28: qty 40

## 2017-04-28 MED ORDER — ROCURONIUM BROMIDE 100 MG/10ML IV SOLN
INTRAVENOUS | Status: DC | PRN
  Administered 2017-04-28: 20 mg via INTRAVENOUS
  Administered 2017-04-28: 30 mg via INTRAVENOUS
  Administered 2017-04-28: 50 mg via INTRAVENOUS

## 2017-04-28 MED ORDER — KETOROLAC TROMETHAMINE 30 MG/ML IJ SOLN
30.0000 mg | Freq: Four times a day (QID) | INTRAMUSCULAR | Status: DC
  Administered 2017-04-28 – 2017-04-29 (×4): 30 mg via INTRAVENOUS
  Filled 2017-04-28 (×4): qty 1

## 2017-04-28 MED ORDER — LIDOCAINE HCL (CARDIAC) 20 MG/ML IV SOLN
INTRAVENOUS | Status: DC | PRN
  Administered 2017-04-28: 100 mg via INTRAVENOUS

## 2017-04-28 MED ORDER — MIDAZOLAM HCL 2 MG/2ML IJ SOLN
INTRAMUSCULAR | Status: DC | PRN
  Administered 2017-04-28: 2 mg via INTRAVENOUS

## 2017-04-28 MED ORDER — ONDANSETRON HCL 4 MG/2ML IJ SOLN
INTRAMUSCULAR | Status: AC
  Filled 2017-04-28: qty 2

## 2017-04-28 MED ORDER — DOCUSATE SODIUM 100 MG PO CAPS
100.0000 mg | ORAL_CAPSULE | Freq: Two times a day (BID) | ORAL | Status: DC
  Administered 2017-04-28 – 2017-04-29 (×2): 100 mg via ORAL
  Filled 2017-04-28 (×2): qty 1

## 2017-04-28 MED ORDER — FENTANYL CITRATE (PF) 100 MCG/2ML IJ SOLN
25.0000 ug | INTRAMUSCULAR | Status: DC | PRN
  Administered 2017-04-28 (×4): 25 ug via INTRAVENOUS

## 2017-04-28 MED ORDER — GLYCOPYRROLATE 0.2 MG/ML IJ SOLN
INTRAMUSCULAR | Status: DC | PRN
  Administered 2017-04-28: 0.2 mg via INTRAVENOUS

## 2017-04-28 MED ORDER — ONDANSETRON HCL 4 MG/2ML IJ SOLN: 4.0000 mg | Freq: Once | INTRAMUSCULAR | Status: DC | PRN

## 2017-04-28 MED ORDER — EPHEDRINE SULFATE 50 MG/ML IJ SOLN
INTRAMUSCULAR | Status: DC | PRN
  Administered 2017-04-28: 15 mg via INTRAVENOUS

## 2017-04-28 MED ORDER — SIMETHICONE 80 MG PO CHEW: 80.0000 mg | CHEWABLE_TABLET | Freq: Four times a day (QID) | ORAL | Status: DC | PRN

## 2017-04-28 MED ORDER — ONDANSETRON HCL 4 MG/2ML IJ SOLN: 4.0000 mg | Freq: Four times a day (QID) | INTRAMUSCULAR | Status: DC | PRN

## 2017-04-28 MED ORDER — SUGAMMADEX SODIUM 200 MG/2ML IV SOLN
INTRAVENOUS | Status: DC | PRN
  Administered 2017-04-28: 200 mg via INTRAVENOUS

## 2017-04-28 MED ORDER — FENTANYL CITRATE (PF) 100 MCG/2ML IJ SOLN
INTRAMUSCULAR | Status: DC | PRN
  Administered 2017-04-28 (×2): 50 ug via INTRAVENOUS
  Administered 2017-04-28: 100 ug via INTRAVENOUS

## 2017-04-28 MED ORDER — DEXAMETHASONE SODIUM PHOSPHATE 10 MG/ML IJ SOLN
INTRAMUSCULAR | Status: DC | PRN
  Administered 2017-04-28: 10 mg via INTRAVENOUS

## 2017-04-28 MED ORDER — FLUORESCEIN SODIUM 10 % IV SOLN
INTRAVENOUS | Status: DC | PRN
  Administered 2017-04-28: .5 mL via INTRAVENOUS

## 2017-04-28 MED ORDER — BISACODYL 10 MG RE SUPP
10.0000 mg | Freq: Every day | RECTAL | Status: DC | PRN
  Filled 2017-04-28: qty 1

## 2017-04-28 MED ORDER — EPHEDRINE SULFATE 50 MG/ML IJ SOLN
INTRAMUSCULAR | Status: AC
  Filled 2017-04-28: qty 1

## 2017-04-28 SURGICAL SUPPLY — 50 items
APPLICATOR ARISTA FLEXITIP XL (MISCELLANEOUS) ×2 IMPLANT
BAG URINE DRAINAGE (UROLOGICAL SUPPLIES) ×2 IMPLANT
BLADE SURG SZ11 CARB STEEL (BLADE) ×2 IMPLANT
CATH FOLEY 2WAY  5CC 16FR (CATHETERS) ×1
CATH URTH 16FR FL 2W BLN LF (CATHETERS) ×1 IMPLANT
CHLORAPREP W/TINT 26ML (MISCELLANEOUS) ×2 IMPLANT
CORD MONOPOLAR M/FML 12FT (MISCELLANEOUS) ×2 IMPLANT
DERMABOND ADVANCED (GAUZE/BANDAGES/DRESSINGS) ×1
DERMABOND ADVANCED .7 DNX12 (GAUZE/BANDAGES/DRESSINGS) ×1 IMPLANT
DRSG TEGADERM 2X2.25 PEDS (GAUZE/BANDAGES/DRESSINGS) ×6 IMPLANT
ELECT REM PT RETURN 9FT ADLT (ELECTROSURGICAL) ×2
ELECTRODE REM PT RTRN 9FT ADLT (ELECTROSURGICAL) ×1 IMPLANT
GAUZE PETRO XEROFOAM 1X8 (MISCELLANEOUS) ×2 IMPLANT
GLOVE BIO SURGEON STRL SZ 6.5 (GLOVE) ×8 IMPLANT
GLOVE BIO SURGEON STRL SZ7 (GLOVE) ×2 IMPLANT
GLOVE BIO SURGEON STRL SZ8 (GLOVE) ×10 IMPLANT
GLOVE INDICATOR 7.0 STRL GRN (GLOVE) ×2 IMPLANT
GLOVE INDICATOR 8.0 STRL GRN (GLOVE) ×2 IMPLANT
GOWN STRL REUS W/ TWL LRG LVL3 (GOWN DISPOSABLE) ×2 IMPLANT
GOWN STRL REUS W/ TWL XL LVL3 (GOWN DISPOSABLE) ×2 IMPLANT
GOWN STRL REUS W/TWL LRG LVL3 (GOWN DISPOSABLE) ×2
GOWN STRL REUS W/TWL XL LVL3 (GOWN DISPOSABLE) ×2
HEMOSTAT ARISTA ABSORB 3G PWDR (MISCELLANEOUS) ×2 IMPLANT
IRRIGATION STRYKERFLOW (MISCELLANEOUS) ×1 IMPLANT
IRRIGATOR STRYKERFLOW (MISCELLANEOUS) ×2
IV LACTATED RINGERS 1000ML (IV SOLUTION) ×2 IMPLANT
KIT PINK PAD W/HEAD ARE REST (MISCELLANEOUS) ×2
KIT PINK PAD W/HEAD ARM REST (MISCELLANEOUS) ×1 IMPLANT
KIT TURNOVER CYSTO (KITS) ×2 IMPLANT
LABEL OR SOLS (LABEL) ×2 IMPLANT
PACK BASIN MINOR ARMC (MISCELLANEOUS) ×2 IMPLANT
PACK GYN LAPAROSCOPIC (MISCELLANEOUS) ×2 IMPLANT
PAD OB MATERNITY 4.3X12.25 (PERSONAL CARE ITEMS) ×2 IMPLANT
PENCIL ELECTRO HAND CTR (MISCELLANEOUS) ×2 IMPLANT
SCISSORS METZENBAUM CVD 33 (INSTRUMENTS) IMPLANT
SET CYSTO W/LG BORE CLAMP LF (SET/KITS/TRAYS/PACK) ×2 IMPLANT
SHEARS HARMONIC ACE PLUS 36CM (ENDOMECHANICALS) ×2 IMPLANT
SLEEVE ENDOPATH XCEL 5M (ENDOMECHANICALS) ×4 IMPLANT
SPONGE XRAY 4X4 16PLY STRL (MISCELLANEOUS) ×2 IMPLANT
SUT CHROMIC 2 0 CT 1 (SUTURE) ×2 IMPLANT
SUT MNCRL 4-0 (SUTURE) ×1
SUT MNCRL 4-0 27XMFL (SUTURE) ×1
SUT VIC AB 0 CT1 27 (SUTURE) ×1
SUT VIC AB 0 CT1 27XCR 8 STRN (SUTURE) ×1 IMPLANT
SUT VIC AB 0 CT1 36 (SUTURE) ×2 IMPLANT
SUTURE MNCRL 4-0 27XMF (SUTURE) ×1 IMPLANT
SYR 10ML LL (SYRINGE) ×2 IMPLANT
TAPE TRANSPORE STRL 2 31045 (GAUZE/BANDAGES/DRESSINGS) ×2 IMPLANT
TROCAR XCEL NON-BLD 5MMX100MML (ENDOMECHANICALS) ×2 IMPLANT
TUBING INSUF HEATED (TUBING) ×2 IMPLANT

## 2017-04-28 NOTE — Op Note (Signed)
OPERATIVE NOTE:  Christina Stafford PROCEDURE DATE: 04/28/2017   PREOPERATIVE DIAGNOSIS: 1.  Symptomatic endometriosis 2.  Uterine fibroids 3.  Complex ovarian cyst  POSTOPERATIVE DIAGNOSIS: 1.  Symptomatic endometriosis 2.  Uterine fibroids 3.  Complex ovarian cyst  PROCEDURE: LAVH BSO; cystoscopy SURGEON:  Brayton Mars, MD ASSISTANTS: Dr. Marcelline Mates and PA-S Jillyn Ledger ANESTHESIA: General INDICATIONS: 47 y.o. G1P1001 status post tubal ligation for birth control, with history of endometriosis in the past and uterine fibroids, presents for definitive surgical management.  Patient has persistent pelvic pain, abnormal uterine bleeding and a complex ovarian cyst which has not resolved.  FINDINGS: Fibroid uterus; hemorrhagic left ovarian cyst; enlarged right ovary   I/O's: Total I/O In: 1600 [I.V.:1600] Out: 1050 [Urine:550; Blood:500] COUNTS:  YES SPECIMENS: Uterus with cervix, bilateral fallopian tubes and bilateral ovaries ANTIBIOTIC PROPHYLAXIS:Ancef 2 grams COMPLICATIONS: None immediate  PROCEDURE IN DETAIL: Patient was brought to the operating room and placed in the supine position.  General endotracheal anesthesia was induced without difficulty.  She was placed in the dorsolithotomy position using the bumblebee stirrups. Timeout was completed Single tooth tenaculum was placed onto the cervix to facilitate uterine manipulation intraoperatively.  A Foley catheter was placed and was draining clear yellow urine. Laparoscopic portion of the hysterectomy was performed in standard fashion.  Subumbilical vertical incision 5 mm in length was made.  The Optiview laparoscopic trocar system was used to place a 5 mm port directly into the abdominal pelvic cavity without evidence of bowel or vascular injury.  2 other 5 mm ports were placed in the right and left lower quadrants respectively under direct visualization.  Hysterectomy was performed with the Ace harmonic scalpel along  with Kleppinger bipolar forceps and graspers.  The round ligaments were clamped coagulated and cut.  The infundibular pelvic ligaments were clamped coagulated and cut.  The mesosalpinx was clamped coagulated and cut.  All of these procedures were utilizing the Ace harmonic scalpel, done in a bilateral manner.  The bladder flap was created using the Ace harmonic scalpel along with sharp and blunt dissection.  The posterior leaf of the broad ligament was skeletonized.  The uterine arteries were isolated and were cauterized with Kleppinger bipolar forceps.  These were then transected.  Upon completion of the abdominal portion of the surgery, the vaginal aspect of the hysterectomy was performed. The Hulka tenaculum was removed from the cervix.  A double-tooth tenaculum was placed onto the cervix.  A weighted speculum was placed into the vagina.  Posterior colpotomy was made with Mayo scissors.  The uterosacral ligaments were clamped cut and stick tied using 0 Vicryl suture.  The cervix was circumscribed with the Bovie cautery and the bladder was dissected off the lower uterine segment through sharp and blunt dissection.  Eventually the anterior cul-de-sac was entered.  The remainder of the cardinal broad ligament complexes were clamped cut and stick tied using 0 Vicryl suture.  The specimen was then removed from the vaginal operative field.  The posterior cuff was closed with a running locking stitch of 0 Vicryl suture.  The vagina was closed using simple interrupted sutures of 2-0 chromic.  The uterosacral ligament sutures were cross tied to help with vaginal vault support. Repeat laparoscopy was then performed to verify the integrity of all pedicles.  Slight bleeding was encountered from the right pelvic sidewall and the vessels were cauterized with Kleppinger bipolar forceps producing good hemostasis.  Arista powder was applied to this area of delayed bleeding.  Pneumoperitoneum  was released.  The incisions were  closed with 4-0 Monocryl suture and Dermabond glue was placed over the port sites.  Because the ureters could not be identified post surgery, decision was made to proceed with cystoscopy to verify integrity of the ureters. 0.5 mL of Flourscein  was injected intravenously.  A 30 degree cystoscope was then utilized with lactated Ringer's as irrigant fluid within the bladder.  The ureteral orifices were identified and noted to have excellent E flux of Flourscein dye.  Procedure was then terminated with the cystoscope being removed.  The patient was awakened mobilized and taken recovery room in satisfactory condition.  Christina Brainerd A. [name], MD, [facility]

## 2017-04-28 NOTE — Transfer of Care (Signed)
Immediate Anesthesia Transfer of Care Note  Patient: Christina Stafford  Procedure(s) Performed: LAPAROSCOPIC ASSISTED VAGINAL HYSTERECTOMY WITH BILATERAL SALPINGO OOPHORECTOMY with Cystoscopy (Bilateral Abdomen)  Patient Location: PACU  Anesthesia Type:General  Level of Consciousness: drowsy and patient cooperative  Airway & Oxygen Therapy: Patient Spontanous Breathing and Patient connected to face mask oxygen  Post-op Assessment: Report given to RN and Post -op Vital signs reviewed and stable  Post vital signs: Reviewed and stable  Last Vitals:  Vitals:   04/28/17 0850 04/28/17 1232  BP: (!) 146/105 104/70  Pulse: 62 (!) 36  Resp: 18 17  Temp: 36.6 C 36.8 C  SpO2: 98% 94%    Last Pain:  Vitals:   04/28/17 1232  TempSrc:   PainSc: 0-No pain         Complications: No apparent anesthesia complications

## 2017-04-28 NOTE — Anesthesia Preprocedure Evaluation (Addendum)
Anesthesia Evaluation  Patient identified by MRN, date of birth, ID band Patient awake    Reviewed: Allergy & Precautions, NPO status , Patient's Chart, lab work & pertinent test results  History of Anesthesia Complications (+) PONV and history of anesthetic complications  Airway Mallampati: II       Dental   Pulmonary sleep apnea and Continuous Positive Airway Pressure Ventilation , former smoker,           Cardiovascular hypertension, Pt. on medications + Valvular Problems/Murmurs (murmur, no tx)      Neuro/Psych neg Seizures Anxiety Depression    GI/Hepatic Neg liver ROS, GERD  Medicated and Controlled,  Endo/Other  neg diabetes  Renal/GU negative Renal ROS     Musculoskeletal   Abdominal   Peds  Hematology   Anesthesia Other Findings   Reproductive/Obstetrics                            Anesthesia Physical Anesthesia Plan  ASA: II  Anesthesia Plan: General   Post-op Pain Management:    Induction: Intravenous  PONV Risk Score and Plan: 4 or greater and Dexamethasone, Ondansetron, Midazolam, Diphenhydramine and Treatment may vary due to age or medical condition  Airway Management Planned: Oral ETT  Additional Equipment:   Intra-op Plan:   Post-operative Plan:   Informed Consent: I have reviewed the patients History and Physical, chart, labs and discussed the procedure including the risks, benefits and alternatives for the proposed anesthesia with the patient or authorized representative who has indicated his/her understanding and acceptance.     Plan Discussed with:   Anesthesia Plan Comments:         Anesthesia Quick Evaluation

## 2017-04-28 NOTE — Anesthesia Postprocedure Evaluation (Signed)
Anesthesia Post Note  Patient: Christina Stafford  Procedure(s) Performed: LAPAROSCOPIC ASSISTED VAGINAL HYSTERECTOMY WITH BILATERAL SALPINGO OOPHORECTOMY with Cystoscopy (Bilateral Abdomen)  Patient location during evaluation: PACU Anesthesia Type: General Level of consciousness: awake and alert Pain management: pain level controlled Vital Signs Assessment: post-procedure vital signs reviewed and stable Respiratory status: spontaneous breathing and respiratory function stable Cardiovascular status: stable Anesthetic complications: no     Last Vitals:  Vitals:   04/28/17 0850 04/28/17 1232  BP: (!) 146/105 104/70  Pulse: 62 (!) 36  Resp: 18 17  Temp: 36.6 C 36.8 C  SpO2: 98% 94%    Last Pain:  Vitals:   04/28/17 1232  TempSrc:   PainSc: 0-No pain                 Miel Wisener K

## 2017-04-28 NOTE — Anesthesia Procedure Notes (Signed)
Procedure Name: Intubation Date/Time: 04/28/2017 9:44 AM Performed by: Silvana Newness, CRNA Pre-anesthesia Checklist: Patient identified, Emergency Drugs available, Suction available, Patient being monitored and Timeout performed Patient Re-evaluated:Patient Re-evaluated prior to induction Oxygen Delivery Method: Circle system utilized Preoxygenation: Pre-oxygenation with 100% oxygen Induction Type: IV induction Ventilation: Mask ventilation without difficulty Laryngoscope Size: Mac and 3 Grade View: Grade I Tube type: Oral Tube size: 7.0 mm Number of attempts: 1 Airway Equipment and Method: Stylet Placement Confirmation: ETT inserted through vocal cords under direct vision,  positive ETCO2 and breath sounds checked- equal and bilateral Secured at: 21 cm Tube secured with: Tape Dental Injury: Teeth and Oropharynx as per pre-operative assessment

## 2017-04-28 NOTE — Consult Note (Signed)
[facility] Vascular Consult Note  MRN : [id number]  Christina Stafford is a 47 y.o. (1971-01-25) female who presents with chief complaint of No chief complaint on file. Marland Kitchen  History of Present Illness: I am asked to see the patient by Dr. Enzo Bi in regards to left arm pain after hysterectomy.  She has previously been seen in our office by her 83 assistant.  She was diagnosed with some sort of forearm pseudoaneurysm on an ultrasound by her primary care physician a couple of weeks ago.  When she awoke from anesthesia her left arm was painful and tingly.  Within an hour, that pain has resolved and she has no pain in her left arm now.  She has no swelling in the left arm.  She has no chest pain or shortness of breath.  Her hand is warm and she does not have any hand pain.  It had previously been discussed with her in the office to consider angiogram for further evaluation of her left arm arterial pathology. She does have a history of Carpal Tunnel syndrome as well.  Current Facility-Administered Medications  Medication Dose Route Frequency Provider Last Rate Last Dose  . bisacodyl (DULCOLAX) suppository 10 mg  10 mg Rectal Daily PRN Defrancesco, Alanda Slim, MD      . docusate sodium (COLACE) capsule 100 mg  100 mg Oral BID Defrancesco, Alanda Slim, MD      . ketorolac (TORADOL) 30 MG/ML injection 30 mg  30 mg Intravenous Q6H Defrancesco, Alanda Slim, MD   30 mg at 04/28/17 1415   Or  . ketorolac (TORADOL) 30 MG/ML injection 30 mg  30 mg Intramuscular Q6H Defrancesco, Alanda Slim, MD      . lactated ringers infusion   Intravenous Continuous Defrancesco, Alanda Slim, MD 125 mL/hr at 04/28/17 1700    . morphine 2 MG/ML injection 1-2 mg  1-2 mg Intravenous Q2H PRN Defrancesco, Alanda Slim, MD   2 mg at 04/28/17 1926  . ondansetron (ZOFRAN) injection 4 mg  4 mg Intravenous Q6H PRN Defrancesco, Alanda Slim, MD      . simethicone (MYLICON) chewable tablet 80 mg  80 mg Oral QID PRN  Defrancesco, Alanda Slim, MD        Past Medical History:  Diagnosis Date  . Carpal tunnel syndrome on both sides   . Complication of anesthesia   . Depression   . Family history of adverse reaction to anesthesia    N/V  . GERD (gastroesophageal reflux disease)   . Heart murmur    DURING PREGNANCY  . Hyperlipidemia   . Hypertension   . Obesity   . PONV (postoperative nausea and vomiting)   . Sleep apnea 2011   using CPAP 6 cm H20  [facility] Sleep Study  . Vertigo     Past Surgical History:  Procedure Laterality Date  . BREAST SURGERY  2003   negative biopsy  . CHOLECYSTECTOMY  1995  . OVARIAN CYST REMOVAL    . TONSILLECTOMY AND ADENOIDECTOMY  2003  . TUBAL LIGATION      Social History Social History   Tobacco Use  . Smoking status: Former Smoker    Packs/day: 0.25    Years: 10.00    Pack years: 2.50    Types: Cigarettes    Start date: 05/17/2011  . Smokeless tobacco: Never Used  Substance Use Topics  . Alcohol use: Yes    Alcohol/week: 1.8 oz    Types: 3 Glasses of wine  per week    Comment: occas  . Drug use: No    Family History Family History  Problem Relation Age of Onset  . Hypertension Mother   . Diabetes Mother   . Uterine cancer Mother   . Hyperlipidemia Father   . Breast cancer Neg Hx   . Ovarian cancer Neg Hx   . Colon cancer Neg Hx     No Known Allergies   REVIEW OF SYSTEMS (Negative unless checked)  Constitutional: [] Weight loss  [] Fever  [] Chills Cardiac: [] Chest pain   [] Chest pressure   [] Palpitations   [] Shortness of breath when laying flat   [] Shortness of breath at rest   [] Shortness of breath with exertion. Vascular:  [] Pain in legs with walking   [] Pain in legs at rest   [] Pain in legs when laying flat   [] Claudication   [] Pain in feet when walking  [] Pain in feet at rest  [] Pain in feet when laying flat   [] History of DVT   [] Phlebitis   [] Swelling in legs   [] Varicose veins   [] Non-healing ulcers Pulmonary:   [] Uses home oxygen    [] Productive cough   [] Hemoptysis   [] Wheeze  [] COPD   [] Asthma Neurologic:  [] Dizziness  [] Blackouts   [] Seizures   [] History of stroke   [] History of TIA  [] Aphasia   [] Temporary blindness   [] Dysphagia   [x] Weakness or numbness in arms   [] Weakness or numbness in legs Musculoskeletal:  [] Arthritis   [] Joint swelling   [] Joint pain   [] Low back pain Hematologic:  [] Easy bruising  [] Easy bleeding   [] Hypercoagulable state   [] Anemic  [] Hepatitis Gastrointestinal:  [] Blood in stool   [] Vomiting blood  [x] Gastroesophageal reflux/heartburn   [] Difficulty swallowing. Genitourinary:  [] Chronic kidney disease   [] Difficult urination  [] Frequent urination  [] Burning with urination   [] Blood in urine Skin:  [] Rashes   [] Ulcers   [] Wounds Psychological:  [x] History of anxiety   []  History of major depression.  Physical Examination  Vitals:   04/28/17 1301 04/28/17 1415 04/28/17 1512 04/28/17 1927  BP:  127/77 113/73 107/70  Pulse: 71 65 67 77  Resp: 13 16 16 18   Temp: (!) 97.5 F (36.4 C) 98.1 F (36.7 C) 98.1 F (36.7 C) 98.3 F (36.8 C)  TempSrc:  Oral Oral Oral  SpO2: 96% 99% 98% 96%   There is no height or weight on file to calculate BMI. Gen:  WD/WN, NAD Head: [place]/AT, No temporalis wasting.  Ear/Nose/Throat: Hearing grossly intact, nares w/o erythema or drainage, oropharynx w/o Erythema/Exudate Eyes: Sclera non-icteric, conjunctiva clear Neck: Trachea midline.  No JVD.  Pulmonary:  Good air movement, respirations not labored, equal bilaterally.  Cardiac: RRR, normal S1, S2. Vascular:  Vessel Right Left  Radial Palpable Palpable  Ulnar Palpable Palpable  Brachial Palpable Palpable                           Musculoskeletal: M/S 5/5 throughout.  Extremities without ischemic changes.  No deformity or atrophy. No edema. Neurologic: Sensation grossly intact in extremities.  Symmetrical.  Speech is fluent. Motor exam as listed above. Psychiatric: Judgment intact, Mood & affect  appropriate for pt's clinical situation. Dermatologic: No rashes or ulcers noted.  No cellulitis or open wounds      CBC Lab Results  Component Value Date   WBC 5.9 04/10/2017   HGB 13.9 04/10/2017   HCT 40.2 04/10/2017   MCV 91.5 04/10/2017  PLT 251 04/10/2017    BMET    Component Value Date/Time   NA 136 10/15/2016 1636   K 4.1 10/15/2016 1636   CL 102 10/15/2016 1636   CO2 28 10/15/2016 1636   GLUCOSE 116 (H) 10/15/2016 1636   BUN 14 10/15/2016 1636   CREATININE 0.95 10/15/2016 1636   CALCIUM 9.2 10/15/2016 1636   GFRNONAA >60 12/20/2015 1857   GFRAA >60 12/20/2015 1857   CrCl cannot be calculated (Patient's most recent lab result is older than the maximum 21 days allowed.).  COAG No results found for: INR, PROTIME  Radiology No results found.    Assessment/Plan 1. Left arm pain which has now resolved.  She has been told by her PCP that she has a forearm pseudoaneurysm.  Although this is very rare particularly without arterial intervention or difficult venous access, it is not unheard of.  It was recommended to her previously to have an angiogram of the left arm, and I would recommend that.  This can be done as an outpatient as previously planned and there is not urgent or immediate need for intervention or evaluation at this time.  No expanding hematoma, no signs of embolization or ischemia, and no significant current symptoms. She would like to recover from her current surgery/admission which I think is reasonable.   2. S/p hysterectomy and BSO for symptomatic endometriosis and fibroids.  Seems to be doing well and likely going home tomorrow. 3. HTN. Stable on outpatient medications and BP control important in reducing atherosclerosis risk and aneurysm growth.   Leotis Pain, MD  04/28/2017 7:34 PM    This note was created with Dragon medical transcription system.  Any error is purely unintentional

## 2017-04-28 NOTE — OR Nursing (Signed)
Patient reports small pea size hard area on the outer aspect of her left arm below the elbow. She has been evaluated by vascular PA who told patient it is not a clot but an aneurysm. She reports they told her to not do BP's or blood draws on that arm.

## 2017-04-28 NOTE — Interval H&P Note (Signed)
History and Physical Interval Note:  04/28/2017 9:26 AM  Christina Stafford  has presented today for surgery, with the diagnosis of COMPLEX OVARIAN CYST, HISTORY OF ENDOMETRIOSIS, UTERINE LEIOMYOMA  The various methods of treatment have been discussed with the patient and family. After consideration of risks, benefits and other options for treatment, the patient has consented to  Procedure(s): Gueydan (Bilateral) as a surgical intervention .  The patient's history has been reviewed, patient examined, no change in status, stable for surgery.  I have reviewed the patient's chart and labs.  Questions were answered to the patient's satisfaction.    The pt had the flu 1 week ago. Last fever was 4 days ago. She completed Tamiflu and is currently asymptomatic. She is cleared for surgery.   Hassell Done A Jolita Haefner

## 2017-04-28 NOTE — Anesthesia Post-op Follow-up Note (Signed)
Anesthesia QCDR form completed.        

## 2017-04-29 ENCOUNTER — Encounter: Payer: Self-pay | Admitting: Obstetrics and Gynecology

## 2017-04-29 DIAGNOSIS — D251 Intramural leiomyoma of uterus: Secondary | ICD-10-CM | POA: Diagnosis not present

## 2017-04-29 DIAGNOSIS — D270 Benign neoplasm of right ovary: Secondary | ICD-10-CM

## 2017-04-29 LAB — HEMOGLOBIN: Hemoglobin: 10.7 g/dL — ABNORMAL LOW (ref 12.0–16.0)

## 2017-04-29 LAB — SURGICAL PATHOLOGY

## 2017-04-29 MED ORDER — ACETAMINOPHEN 500 MG PO TABS: 1000.0000 mg | ORAL_TABLET | Freq: Four times a day (QID) | ORAL | 1 refills | Status: DC

## 2017-04-29 MED ORDER — ACETAMINOPHEN 500 MG PO TABS
1000.0000 mg | ORAL_TABLET | Freq: Four times a day (QID) | ORAL | Status: DC
  Administered 2017-04-29: 1000 mg via ORAL
  Filled 2017-04-29: qty 2

## 2017-04-29 MED ORDER — DOCUSATE SODIUM 100 MG PO CAPS: 100.0000 mg | ORAL_CAPSULE | Freq: Two times a day (BID) | ORAL | 0 refills | Status: DC

## 2017-04-29 MED ORDER — HYDROMORPHONE HCL 2 MG PO TABS: 2.0000 mg | ORAL_TABLET | ORAL | 0 refills | Status: DC | PRN

## 2017-04-29 MED ORDER — IBUPROFEN 600 MG PO TABS: 600.0000 mg | ORAL_TABLET | Freq: Four times a day (QID) | ORAL | Status: DC

## 2017-04-29 MED ORDER — IBUPROFEN 600 MG PO TABS: 600.0000 mg | ORAL_TABLET | Freq: Four times a day (QID) | ORAL | 1 refills | Status: DC

## 2017-04-29 MED ORDER — HYDROMORPHONE HCL 2 MG PO TABS: 2.0000 mg | ORAL_TABLET | ORAL | Status: DC | PRN

## 2017-04-29 NOTE — Progress Notes (Signed)
Patient understands all discharge instructions and the need to attend follow up appointments. Provided patient with written and verbal instructions on proper incision care and post-op care after surgery.  Patient discharge via wheelchair with auxillary.

## 2017-04-29 NOTE — Telephone Encounter (Signed)
Pt never returned phone call

## 2017-04-29 NOTE — Discharge Summary (Signed)
Physician Discharge Summary  Patient ID: Christina Stafford MRN: [id number] DOB/AGE: [birth year] 49 y.o.  Admit date: 04/28/2017 Discharge date: 04/29/2017  Admission Diagnoses: Adnexal Mass, Chronic Pelvic Pain, Endometriosis and Fibroids  Discharge Diagnoses:  Adnexal Mass, Chronic Pelvic Pain, Endometriosis and Fibroids  Operative Procedures: Procedure(s): LAPAROSCOPIC ASSISTED VAGINAL HYSTERECTOMY WITH BILATERAL SALPINGO OOPHORECTOMY with Cystoscopy (Bilateral)  Hospital Course: Uncomplicated .   Significant Diagnostic Studies:  Lab Results  Component Value Date   HGB 10.7 (L) 04/29/2017   HGB 13.9 04/10/2017   HGB 13.1 10/15/2016   Lab Results  Component Value Date   HCT 40.2 04/10/2017   HCT 38.6 10/15/2016   HCT 44.0 12/20/2015   CBC Latest Ref Rng & Units 04/29/2017 04/10/2017 10/15/2016  WBC 3.6 - 11.0 K/uL - 5.9 6.8  Hemoglobin 12.0 - 16.0 g/dL 10.7(L) 13.9 13.1  Hematocrit 35.0 - 47.0 % - 40.2 38.6  Platelets 150 - 440 K/uL - 251 302.0     Discharged Condition: good  Discharge Exam: Blood pressure 109/71, pulse 66, temperature 98 F (36.7 C), temperature source Oral, resp. rate 18, height 5' 3"  (1.6 m), weight 216 lb (98 kg), SpO2 95 %. Incision/Wound: clean, dry and no drainage  Disposition: 01-Home or Self Care   Allergies as of 04/29/2017   No Known Allergies     Medication List    STOP taking these medications   oseltamivir 75 MG capsule Commonly known as:  TAMIFLU     TAKE these medications   acetaminophen 500 MG tablet Commonly known as:  TYLENOL Take 2 tablets (1,000 mg total) by mouth 4 (four) times daily.   ALPRAZolam 0.5 MG tablet Commonly known as:  XANAX Take 1 tablet (0.5 mg total) by mouth daily as needed for anxiety (or panic attacks).   amLODipine 2.5 MG tablet Commonly known as:  NORVASC TAKE 1 TABLET(2.5 MG) BY MOUTH DAILY   cetirizine 10 MG tablet Commonly known as:  ZYRTEC Take 10 mg by mouth daily.   docusate  sodium 100 MG capsule Commonly known as:  COLACE Take 1 capsule (100 mg total) by mouth 2 (two) times daily.   esomeprazole 20 MG capsule Commonly known as:  NEXIUM Take 20 mg by mouth every morning.   HYDROmorphone 2 MG tablet Commonly known as:  DILAUDID Take 1 tablet (2 mg total) by mouth every 4 (four) hours as needed for moderate pain or severe pain.   ibuprofen 600 MG tablet Commonly known as:  ADVIL,MOTRIN Take 1 tablet (600 mg total) by mouth 4 (four) times daily. What changed:    medication strength  how much to take  when to take this  reasons to take this   metoprolol succinate 25 MG 24 hr tablet Commonly known as:  TOPROL-XL TAKE 1 TABLET(25 MG) BY MOUTH DAILY What changed:  See the new instructions.   sertraline 50 MG tablet Commonly known as:  ZOLOFT TAKE 1 AND 1/2 TABLETS(75 MG) BY MOUTH DAILY What changed:  See the new instructions.   tetrahydrozoline 0.05 % ophthalmic solution Place 2 drops into both eyes as needed (dry eyes).      Follow-up Information    Sanii Kukla, Alanda Slim, MD. Go in 1 week(s).   Specialties:  Obstetrics and Gynecology, Radiology Why:  Post op check Contact information: [address] [place] [address] [phone]           Signed: Alanda Slim Seeley Hissong 04/29/2017, 8:07 AM

## 2017-04-29 NOTE — Plan of Care (Signed)
Vs stable; moving well in bed; taking toradol and morphine for pain control; foley catheter to be removed this shift; tolerated clear liquids and diet advanced to regular

## 2017-05-05 ENCOUNTER — Telehealth (INDEPENDENT_AMBULATORY_CARE_PROVIDER_SITE_OTHER): Payer: Self-pay

## 2017-05-05 ENCOUNTER — Encounter (INDEPENDENT_AMBULATORY_CARE_PROVIDER_SITE_OTHER): Payer: Self-pay

## 2017-05-05 NOTE — Telephone Encounter (Signed)
I attempted to contact the patient multiple times to schedule her procedure. She stated that an appt can be made between 05/05/17-05/29/17. I have made her an appt for 05/19/17 with Dew, the information will be mailed to the patient.

## 2017-05-12 ENCOUNTER — Other Ambulatory Visit (INDEPENDENT_AMBULATORY_CARE_PROVIDER_SITE_OTHER): Payer: Self-pay | Admitting: Vascular Surgery

## 2017-05-13 ENCOUNTER — Ambulatory Visit (INDEPENDENT_AMBULATORY_CARE_PROVIDER_SITE_OTHER): Payer: BLUE CROSS/BLUE SHIELD | Admitting: Obstetrics and Gynecology

## 2017-05-13 ENCOUNTER — Encounter: Payer: Self-pay | Admitting: Obstetrics and Gynecology

## 2017-05-13 VITALS — BP 128/81 | HR 85 | Ht 63.0 in | Wt 208.2 lb

## 2017-05-13 DIAGNOSIS — N951 Menopausal and female climacteric states: Secondary | ICD-10-CM

## 2017-05-13 DIAGNOSIS — Z90710 Acquired absence of both cervix and uterus: Secondary | ICD-10-CM

## 2017-05-13 NOTE — Patient Instructions (Signed)
1.  Begin estradiol 1 mg a day 2.  Gradually resume activities as tolerated 3.  Return in 5 weeks for final postop check  Estradiol tablets What is this medicine? ESTRADIOL (es tra DYE ole) is an estrogen. It is mostly used as hormone replacement in menopausal women. It helps to treat hot flashes and prevent osteoporosis. It is also used to treat women with low estrogen levels or those who have had their ovaries removed. This medicine may be used for other purposes; ask your health care provider or pharmacist if you have questions. COMMON BRAND NAME(S): Estrace, Femtrace, Gynodiol What should I tell my health care provider before I take this medicine? They need to know if you have or ever had any of these conditions: -abnormal vaginal bleeding -blood vessel disease or blood clots -breast, cervical, endometrial, ovarian, liver, or uterine cancer -dementia -diabetes -gallbladder disease -heart disease or recent heart attack -high blood pressure -high cholesterol -high level of calcium in the blood -hysterectomy -kidney disease -liver disease -migraine headaches -protein C deficiency -protein S deficiency -stroke -systemic lupus erythematosus (SLE) -tobacco smoker -an unusual or allergic reaction to estrogens, other hormones, medicines, foods, dyes, or preservatives -pregnant or trying to get pregnant -breast-feeding How should I use this medicine? Take this medicine by mouth. To reduce nausea, this medicine may be taken with food. Follow the directions on the prescription label. Take this medicine at the same time each day and in the order directed on the package. Do not take your medicine more often than directed. Contact your pediatrician regarding the use of this medicine in children. Special care may be needed. A patient package insert for the product will be given with each prescription and refill. Read this sheet carefully each time. The sheet may change  frequently. Overdosage: If you think you have taken too much of this medicine contact a poison control center or emergency room at once. NOTE: This medicine is only for you. Do not share this medicine with others. What if I miss a dose? If you miss a dose, take it as soon as you can. If it is almost time for your next dose, take only that dose. Do not take double or extra doses. What may interact with this medicine? Do not take this medicine with any of the following medications: -aromatase inhibitors like aminoglutethimide, anastrozole, exemestane, letrozole, testolactone This medicine may also interact with the following medications: -carbamazepine -certain antibiotics used to treat infections -certain barbiturates or benzodiazepines used for inducing sleep or treating seizures -grapefruit juice -medicines for fungus infections like itraconazole and ketoconazole -raloxifene or tamoxifen -rifabutin, rifampin, or rifapentine -ritonavir -St. John's Wort -warfarin This list may not describe all possible interactions. Give your health care provider a list of all the medicines, herbs, non-prescription drugs, or dietary supplements you use. Also tell them if you smoke, drink alcohol, or use illegal drugs. Some items may interact with your medicine. What should I watch for while using this medicine? Visit your doctor or health care professional for regular checks on your progress. You will need a regular breast and pelvic exam and Pap smear while on this medicine. You should also discuss the need for regular mammograms with your health care professional, and follow his or her guidelines for these tests. This medicine can make your body retain fluid, making your fingers, hands, or ankles swell. Your blood pressure can go up. Contact your doctor or health care professional if you feel you are retaining fluid. If you have  any reason to think you are pregnant, stop taking this medicine right away and  contact your doctor or health care professional. Smoking increases the risk of getting a blood clot or having a stroke while you are taking this medicine, especially if you are more than 49 years old. You are strongly advised not to smoke. If you wear contact lenses and notice visual changes, or if the lenses begin to feel uncomfortable, consult your eye doctor or health care professional. This medicine can increase the risk of developing a condition (endometrial hyperplasia) that may lead to cancer of the lining of the uterus. Taking progestins, another hormone drug, with this medicine lowers the risk of developing this condition. Therefore, if your uterus has not been removed (by a hysterectomy), your doctor may prescribe a progestin for you to take together with your estrogen. You should know, however, that taking estrogens with progestins may have additional health risks. You should discuss the use of estrogens and progestins with your health care professional to determine the benefits and risks for you. If you are going to have surgery, you may need to stop taking this medicine. Consult your health care professional for advice before you schedule the surgery. What side effects may I notice from receiving this medicine? Side effects that you should report to your doctor or health care professional as soon as possible: -allergic reactions like skin rash, itching or hives, swelling of the face, lips, or tongue -breast tissue changes or discharge -changes in vision -chest pain -confusion, trouble speaking or understanding -dark urine -general ill feeling or flu-like symptoms -light-colored stools -nausea, vomiting -pain, swelling, warmth in the leg -right upper belly pain -severe headaches -shortness of breath -sudden numbness or weakness of the face, arm or leg -trouble walking, dizziness, loss of balance or coordination -unusual vaginal bleeding -yellowing of the eyes or skin Side effects  that usually do not require medical attention (report to your doctor or health care professional if they continue or are bothersome): -hair loss -increased hunger or thirst -increased urination -symptoms of vaginal infection like itching, irritation or unusual discharge -unusually weak or tired This list may not describe all possible side effects. Call your doctor for medical advice about side effects. You may report side effects to FDA at 1-800-FDA-1088. Where should I keep my medicine? Keep out of the reach of children. Store at room temperature between 20 and 25 degrees C (68 and 77 degrees F). Keep container tightly closed. Protect from light. Throw away any unused medicine after the expiration date. NOTE: This sheet is a summary. It may not cover all possible information. If you have questions about this medicine, talk to your doctor, pharmacist, or health care provider.  2018 Elsevier/Gold Standard (2010-06-06 29:93:71)

## 2017-05-13 NOTE — Progress Notes (Signed)
Chief complaint: 1.  1 week postop check 2.  Status post LAVH BSO with cystoscopy  Gaytha reports doing well since her surgery.  She is not using any analgesic medications at this time for pain.  Bowel function is normal.  Bladder function is normal.  She is experiencing significant vasomotor symptoms and is medical management of this condition. 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  She will discuss continued use of estrogen replacement therapy with her vascular surgeon and its impact on the left forearm vascular anomaly.  Pathology from surgery: DIAGNOSIS:  A. 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  - PROLIFERATIVE ENDOMETRIUM.  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  - LEFT OVARY WITH FOLLICULAR CYST.  - BILATERAL FALLOPIAN TUBES STATUS POST TUBAL LIGATION.  - NEGATIVE FOR ATYPIA AND MALIGNANCY.    OBJECTIVE: BP 128/81   Pulse 85   Ht 5' 3"  (1.6 m)   Wt 208 lb 3.2 oz (94.4 kg)   LMP 01/20/2017 (Approximate)   BMI 36.88 kg/m  Pleasant female no acute distress.  Alert and oriented. Abdomen: Soft, nontender; laparoscopy incisions are well approximated with residual Dermabond glue present. Pelvic: Deferred  ASSESSMENT: 1.  Normal postop check 2.  1 week status post LAVH BSO 3.  Symptomatic surgical menopause 4.  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  Gradually resume activities as tolerated 2.  Begin estradiol 1 mg a day and monitor hot flashes 3.  Return in 5 weeks for final postop check 4.  Discussed ERT use with the vascular surgeon regarding its impact on the left forearm vascular anomaly  Brayton Mars, MD  Note: This dictation was  prepared with Dragon dictation along with smaller phrase technology. Any transcriptional errors that result from this process are unintentional.

## 2017-05-18 MED ORDER — CEFAZOLIN SODIUM-DEXTROSE 2-4 GM/100ML-% IV SOLN
2.0000 g | Freq: Once | INTRAVENOUS | Status: AC
  Administered 2017-05-19: 2 g via INTRAVENOUS

## 2017-05-19 ENCOUNTER — Encounter: Payer: Self-pay | Admitting: *Deleted

## 2017-05-19 ENCOUNTER — Encounter
Admission: RE | Disposition: A | Discharge status: Home / Self Care | Payer: Self-pay | Source: Ambulatory Visit | Attending: Vascular Surgery

## 2017-05-19 ENCOUNTER — Ambulatory Visit
Admission: RE | Admit: 2017-05-19 | Discharge: 2017-05-19 | Disposition: A | Discharge status: Home / Self Care | Payer: BLUE CROSS/BLUE SHIELD | Source: Ambulatory Visit | Attending: Vascular Surgery | Admitting: Vascular Surgery

## 2017-05-19 DIAGNOSIS — Z8049 Family history of malignant neoplasm of other genital organs: Secondary | ICD-10-CM | POA: Diagnosis not present

## 2017-05-19 DIAGNOSIS — Z90721 Acquired absence of ovaries, unilateral: Secondary | ICD-10-CM | POA: Diagnosis not present

## 2017-05-19 DIAGNOSIS — Z6838 Body mass index (BMI) 38.0-38.9, adult: Secondary | ICD-10-CM | POA: Diagnosis not present

## 2017-05-19 DIAGNOSIS — F329 Major depressive disorder, single episode, unspecified: Secondary | ICD-10-CM | POA: Insufficient documentation

## 2017-05-19 DIAGNOSIS — G5603 Carpal tunnel syndrome, bilateral upper limbs: Secondary | ICD-10-CM | POA: Diagnosis not present

## 2017-05-19 DIAGNOSIS — K219 Gastro-esophageal reflux disease without esophagitis: Secondary | ICD-10-CM | POA: Insufficient documentation

## 2017-05-19 DIAGNOSIS — E785 Hyperlipidemia, unspecified: Secondary | ICD-10-CM | POA: Diagnosis not present

## 2017-05-19 DIAGNOSIS — Z9049 Acquired absence of other specified parts of digestive tract: Secondary | ICD-10-CM | POA: Insufficient documentation

## 2017-05-19 DIAGNOSIS — I10 Essential (primary) hypertension: Secondary | ICD-10-CM | POA: Insufficient documentation

## 2017-05-19 DIAGNOSIS — Z87891 Personal history of nicotine dependence: Secondary | ICD-10-CM | POA: Insufficient documentation

## 2017-05-19 DIAGNOSIS — Z79899 Other long term (current) drug therapy: Secondary | ICD-10-CM | POA: Insufficient documentation

## 2017-05-19 DIAGNOSIS — G4730 Sleep apnea, unspecified: Secondary | ICD-10-CM | POA: Insufficient documentation

## 2017-05-19 DIAGNOSIS — E669 Obesity, unspecified: Secondary | ICD-10-CM | POA: Insufficient documentation

## 2017-05-19 DIAGNOSIS — Z83438 Family history of other disorder of lipoprotein metabolism and other lipidemia: Secondary | ICD-10-CM | POA: Insufficient documentation

## 2017-05-19 DIAGNOSIS — Z9851 Tubal ligation status: Secondary | ICD-10-CM | POA: Insufficient documentation

## 2017-05-19 DIAGNOSIS — M79632 Pain in left forearm: Secondary | ICD-10-CM | POA: Diagnosis not present

## 2017-05-19 DIAGNOSIS — Z833 Family history of diabetes mellitus: Secondary | ICD-10-CM | POA: Insufficient documentation

## 2017-05-19 DIAGNOSIS — M79602 Pain in left arm: Secondary | ICD-10-CM | POA: Diagnosis not present

## 2017-05-19 DIAGNOSIS — Z8249 Family history of ischemic heart disease and other diseases of the circulatory system: Secondary | ICD-10-CM | POA: Diagnosis not present

## 2017-05-19 DIAGNOSIS — Z9889 Other specified postprocedural states: Secondary | ICD-10-CM | POA: Insufficient documentation

## 2017-05-19 HISTORY — PX: UPPER EXTREMITY ANGIOGRAPHY: CATH118270

## 2017-05-19 LAB — CREATININE, SERUM
CREATININE: 0.84 mg/dL (ref 0.44–1.00)
GFR calc Af Amer: >60 mL/min (ref >60)

## 2017-05-19 LAB — BUN: BUN: 13 mg/dL (ref 6–20)

## 2017-05-19 SURGERY — UPPER EXTREMITY ANGIOGRAPHY
Anesthesia: Moderate Sedation | Laterality: Left

## 2017-05-19 MED ORDER — FENTANYL CITRATE (PF) 100 MCG/2ML IJ SOLN
50.0000 ug | Freq: Once | INTRAMUSCULAR | Status: AC
  Administered 2017-05-19: 50 ug via INTRAVENOUS

## 2017-05-19 MED ORDER — HEPARIN SODIUM (PORCINE) 1000 UNIT/ML IJ SOLN
INTRAMUSCULAR | Status: DC | PRN
  Administered 2017-05-19: 3000 [IU] via INTRAVENOUS

## 2017-05-19 MED ORDER — SODIUM CHLORIDE 0.9 % IV SOLN: 250.0000 mL | INTRAVENOUS | Status: DC | PRN

## 2017-05-19 MED ORDER — METHYLPREDNISOLONE SODIUM SUCC 125 MG IJ SOLR: 125.0000 mg | INTRAMUSCULAR | Status: DC | PRN

## 2017-05-19 MED ORDER — HYDROMORPHONE HCL 1 MG/ML IJ SOLN: 1.0000 mg | Freq: Once | INTRAMUSCULAR | Status: DC | PRN

## 2017-05-19 MED ORDER — MIDAZOLAM HCL 5 MG/5ML IJ SOLN
INTRAMUSCULAR | Status: AC
  Filled 2017-05-19: qty 5

## 2017-05-19 MED ORDER — FENTANYL CITRATE (PF) 100 MCG/2ML IJ SOLN
INTRAMUSCULAR | Status: AC
  Filled 2017-05-19: qty 2

## 2017-05-19 MED ORDER — IOPAMIDOL (ISOVUE-300) INJECTION 61%
INTRAVENOUS | Status: DC | PRN
  Administered 2017-05-19: 40 mL via INTRA_ARTERIAL

## 2017-05-19 MED ORDER — FENTANYL CITRATE (PF) 100 MCG/2ML IJ SOLN
25.0000 ug | Freq: Once | INTRAMUSCULAR | Status: AC
  Administered 2017-05-19: 25 ug via INTRAVENOUS

## 2017-05-19 MED ORDER — SODIUM CHLORIDE 0.9 % IV SOLN: INTRAVENOUS | Status: DC

## 2017-05-19 MED ORDER — HYDRALAZINE HCL 20 MG/ML IJ SOLN: 5.0000 mg | INTRAMUSCULAR | Status: DC | PRN

## 2017-05-19 MED ORDER — ONDANSETRON HCL 4 MG/2ML IJ SOLN: 4.0000 mg | Freq: Four times a day (QID) | INTRAMUSCULAR | Status: DC | PRN

## 2017-05-19 MED ORDER — HEPARIN (PORCINE) IN NACL 2-0.9 UNIT/ML-% IJ SOLN
INTRAMUSCULAR | Status: AC
Start: 2017-05-19 — End: 2017-05-19
  Filled 2017-05-19: qty 1000

## 2017-05-19 MED ORDER — CEFAZOLIN SODIUM-DEXTROSE 2-4 GM/100ML-% IV SOLN
INTRAVENOUS | Status: AC
  Filled 2017-05-19: qty 100

## 2017-05-19 MED ORDER — LABETALOL HCL 5 MG/ML IV SOLN: 10.0000 mg | INTRAVENOUS | Status: DC | PRN

## 2017-05-19 MED ORDER — FAMOTIDINE 20 MG PO TABS: 40.0000 mg | ORAL_TABLET | ORAL | Status: DC | PRN

## 2017-05-19 MED ORDER — SODIUM CHLORIDE 0.9% FLUSH: 3.0000 mL | INTRAVENOUS | Status: DC | PRN

## 2017-05-19 MED ORDER — SODIUM CHLORIDE 0.9 % IV SOLN
INTRAVENOUS | Status: DC
  Administered 2017-05-19: 10:00:00 via INTRAVENOUS

## 2017-05-19 MED ORDER — SODIUM CHLORIDE 0.9% FLUSH: 3.0000 mL | Freq: Two times a day (BID) | INTRAVENOUS | Status: DC

## 2017-05-19 MED ORDER — HEPARIN SODIUM (PORCINE) 1000 UNIT/ML IJ SOLN
INTRAMUSCULAR | Status: AC
  Filled 2017-05-19: qty 1

## 2017-05-19 MED ORDER — MIDAZOLAM HCL 2 MG/2ML IJ SOLN
INTRAMUSCULAR | Status: DC | PRN
  Administered 2017-05-19: 1 mg via INTRAVENOUS
  Administered 2017-05-19: 2 mg via INTRAVENOUS

## 2017-05-19 MED ORDER — LIDOCAINE-EPINEPHRINE (PF) 1 %-1:200000 IJ SOLN
INTRAMUSCULAR | Status: AC
  Filled 2017-05-19: qty 30

## 2017-05-19 MED ORDER — FENTANYL CITRATE (PF) 100 MCG/2ML IJ SOLN: 12.5000 ug | INTRAMUSCULAR | Status: DC | PRN

## 2017-05-19 SURGICAL SUPPLY — 12 items
CATH ANGIO 5F 100CM .035 PIG (CATHETERS) ×2 IMPLANT
CATH BEACON 5 .035 100 H1 TIP (CATHETERS) ×2 IMPLANT
CATH CXI SUPP ANG 4FR 135 (MICROCATHETER) ×1 IMPLANT
CATH CXI SUPP ANG 4FR 135CM (MICROCATHETER) ×2
DEVICE STARCLOSE SE CLOSURE (Vascular Products) ×2 IMPLANT
DEVICE TORQUE (MISCELLANEOUS) ×2 IMPLANT
GUIDEWIRE LT ZIPWIRE 035X260 (WIRE) ×2 IMPLANT
PACK ANGIOGRAPHY (CUSTOM PROCEDURE TRAY) ×2 IMPLANT
SHEATH BRITE TIP 5FRX11 (SHEATH) ×2 IMPLANT
SYR MEDRAD MARK V 150ML (SYRINGE) ×2 IMPLANT
TUBING CONTRAST HIGH PRESS 72 (TUBING) ×2 IMPLANT
WIRE J 3MM .035X145CM (WIRE) ×2 IMPLANT

## 2017-05-19 NOTE — Op Note (Signed)
OPERATIVE REPORT   PREOPERATIVE DIAGNOSIS: 1.  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 Ultrasound guidance vascular access to right femoral artery. 2. Catheter placement to left radial artery and left ulnar arteries  from right femoral approach. 3. Thoracic aortogram and selective left upper extremity angiogram  including selective images of the radial and ulnar arteries. 4. StarClose closure device right femoral artery.  SURGEON: Algernon Huxley, MD  ANESTHESIA: Local with moderate conscious sedation for 25 minutes using 3 mg of Versed and 75 Mcg of Fentanyl  BLOOD LOSS: 10 cc  FLUOROSCOPY TIME: 3.5 minutes  INDICATION FOR PROCEDURE: This is a 47 y.o.female who presented to our office with left arm pain.  She had been given the diagnosis of pseudoaneurysm by her primary care physician based off of an ultrasound.  Further evaluation with left upper extremity angiogram was discussed and recommended.  Risks and benefits are discussed. Informed consent was obtained.  DESCRIPTION OF PROCEDURE: The patient was brought to the vascular suite. Moderate conscious sedation was administered during a face to face encounter with the patient throughout the procedure with my supervision of the RN administering medicines and monitoring the patient's vital signs, pulse oximetry, telemetry and mental status throughout from the start of the procedure until the patient was taken to the recovery room.  Groins were shaved and prepped and sterile surgical field was created. The right femoral head was localized with fluoroscopy and the right femoral artery was then visualized with ultrasound and found to be widely patent. It was then accessed under direct ultrasound guidance without difficulty with a Seldinger needle and a permanent image was recorded. A J-wire and 5-French  sheath were then placed. Pigtail catheter was placed into the ascending aorta and a thoracic aortogram was then performed in the LAO projection. This demonstrated normal origins to the great vessels without significant proximal stenoses and a normal configuration of the great vessels. The patient was given 3000 units of intravenous heparin and a headhunter catheter was used to selectively cannulate the left subclavian artery without difficulty. This was then sequentially advanced to the brachial artery and to the brachial bifurcation with imaging of the left subclavian, axillary, and brachial arteries performed during this evaluation with normal flow with no significant atherosclerosis or other findings in these vessels.  I then exchanged for a CXI catheter to evaluate the forearm.  With a Glidewire and the CXI catheter I advanced into the left ulnar artery initially.  On imaging before the brachial bifurcation this was found to be the larger of the 2 vessels in the dominant flow into the hand.  Selective imaging of the ulnar artery showed no significant stenoses, atherosclerosis, pseudoaneurysm, or AV malformation based off of the ulnar artery at this time.  There was somewhat of an incomplete palmar arch with poor cross filling to the radial artery and only the third fourth and fifth digits being filled from the ulnar artery.  I then pulled the CXI catheter back to the brachial artery and selectively cannulated the left radial artery for selective imaging.  Selective imaging of the left radial artery was then performed.  This demonstrated no significant stenosis, atherosclerosis, pseudoaneurysm, or AV ablation based off of the radial artery.  There was better cross-filling in the hand and the radial artery did fill the ulnar artery retrograde through the hand.  At this point, there was no role for  intervention.  If she did have a previous pseudoaneurysm or arterial lesion this was not obvious on her  study today and appears to have healed. The diagnostic catheter was removed. Oblique arteriogram was performed of the right femoral artery and StarClose closure device deployed in the usual fashion with excellent hemostatic result. The patient tolerated the procedure well and was taken to the recovery room in stable condition.   Leotis Pain 05/19/2017 10:37 AM

## 2017-05-19 NOTE — Progress Notes (Signed)
Left arm with moderate amount of swelling in upper forearm. Site soft, no redness or warmth in area. Pt complains of pain 2/10 only with movement. Left radial pulse +2 and unchanged. Dr. Lucky Cowboy  Updated on Pt status, no new orders, OK to discharge after bedrest completed.

## 2017-05-19 NOTE — H&P (Signed)
[facility] History & Physical Update  The patient was interviewed and re-examined.  The patient's previous History and Physical has been reviewed and is unchanged.  There is no change in the plan of care. We plan to proceed with the scheduled procedure.  Leotis Pain, MD  05/19/2017, 8:11 AM

## 2017-05-20 ENCOUNTER — Encounter: Payer: Self-pay | Admitting: Obstetrics and Gynecology

## 2017-05-21 ENCOUNTER — Other Ambulatory Visit: Payer: Self-pay

## 2017-05-21 MED ORDER — ESTRADIOL 1 MG PO TABS: 1.0000 mg | ORAL_TABLET | Freq: Every day | ORAL | 1 refills | Status: DC

## 2017-05-26 ENCOUNTER — Encounter: Payer: Self-pay | Admitting: Obstetrics and Gynecology

## 2017-06-05 NOTE — Telephone Encounter (Signed)
Error

## 2017-06-12 ENCOUNTER — Other Ambulatory Visit: Payer: Self-pay | Admitting: Internal Medicine

## 2017-06-17 ENCOUNTER — Encounter: Payer: BLUE CROSS/BLUE SHIELD | Admitting: Obstetrics and Gynecology

## 2017-06-17 ENCOUNTER — Telehealth: Payer: BLUE CROSS/BLUE SHIELD | Admitting: Family

## 2017-06-17 DIAGNOSIS — B9689 Other specified bacterial agents as the cause of diseases classified elsewhere: Secondary | ICD-10-CM

## 2017-06-17 DIAGNOSIS — J019 Acute sinusitis, unspecified: Secondary | ICD-10-CM

## 2017-06-17 MED ORDER — AMOXICILLIN-POT CLAVULANATE 875-125 MG PO TABS: 1.0000 | ORAL_TABLET | Freq: Two times a day (BID) | ORAL | 0 refills | Status: DC

## 2017-06-17 NOTE — Progress Notes (Signed)
We are sorry that you are not feeling well.  Here is how we plan to help!  Based on what you have shared with me it looks like you have sinusitis.  Sinusitis is inflammation and infection in the sinus cavities of the head.  Based on your presentation I believe you most likely have Acute Bacterial Sinusitis.  This is an infection caused by bacteria and is treated with antibiotics. I have prescribed Augmentin 835m/125mg one tablet twice daily with food, for 7 days. You may use an oral decongestant such as Muscina D or if you have glaucoma or high blood pressure use plain Mucinex. Saline nasal spray help and can safely be used as often as needed for congestion.  If you develop worsening sinus pain, fever or notice severe headache and vision changes, or if symptoms are not better after completion of antibiotic, please schedule an appointment with a health care provider.    Sinus infections are not as easily transmitted as other respiratory infection, however we still recommend that you avoid close contact with loved ones, especially the very young and elderly.  Remember to wash your hands thoroughly throughout the day as this is the number one way to prevent the spread of infection!  Home Care:  Only take medications as instructed by your medical team.  Complete the entire course of an antibiotic.  Do not take these medications with alcohol.  A steam or ultrasonic humidifier can help congestion.  You can place a towel over your head and breathe in the steam from hot water coming from a faucet.  Avoid close contacts especially the very young and the elderly.  Cover your mouth when you cough or sneeze.  Always remember to wash your hands.  Get Help Right Away If:  You develop worsening fever or sinus pain.  You develop a severe head ache or visual changes.  Your symptoms persist after you have completed your treatment plan.  Make sure you  Understand these instructions.  Will watch your  condition.  Will get help right away if you are not doing well or get worse.  Your e-visit answers were reviewed by a board certified advanced clinical practitioner to complete your personal care plan.  Depending on the condition, your plan could have included both over the counter or prescription medications.  If there is a problem please reply  once you have received a response from your provider.  Your safety is important to [place]  If you have drug allergies check your prescription carefully.    You can use MyChart to ask questions about today's visit, request a non-urgent call back, or ask for a work or school excuse for 24 hours related to this e-Visit. If it has been greater than 24 hours you will need to follow up with your provider, or enter a new e-Visit to address those concerns.  You will get an e-mail in the next two days asking about your experience.  I hope that your e-visit has been valuable and will speed your recovery. Thank you for using e-visits.

## 2017-06-18 ENCOUNTER — Other Ambulatory Visit (INDEPENDENT_AMBULATORY_CARE_PROVIDER_SITE_OTHER): Payer: BLUE CROSS/BLUE SHIELD

## 2017-06-18 ENCOUNTER — Ambulatory Visit (INDEPENDENT_AMBULATORY_CARE_PROVIDER_SITE_OTHER): Payer: BLUE CROSS/BLUE SHIELD | Admitting: Vascular Surgery

## 2017-07-16 ENCOUNTER — Other Ambulatory Visit: Payer: Self-pay | Admitting: Internal Medicine

## 2017-08-28 ENCOUNTER — Encounter: Payer: Self-pay | Admitting: Internal Medicine

## 2017-08-29 ENCOUNTER — Encounter: Payer: Self-pay | Admitting: Internal Medicine

## 2017-08-29 ENCOUNTER — Ambulatory Visit: Payer: BLUE CROSS/BLUE SHIELD | Admitting: Internal Medicine

## 2017-08-29 VITALS — BP 128/82 | HR 70 | Temp 98.1°F | Ht 63.0 in | Wt 223.6 lb

## 2017-08-29 DIAGNOSIS — I10 Essential (primary) hypertension: Secondary | ICD-10-CM

## 2017-08-29 DIAGNOSIS — Z0184 Encounter for antibody response examination: Secondary | ICD-10-CM | POA: Diagnosis not present

## 2017-08-29 DIAGNOSIS — Z6839 Body mass index (BMI) 39.0-39.9, adult: Secondary | ICD-10-CM | POA: Diagnosis not present

## 2017-08-29 DIAGNOSIS — Z1231 Encounter for screening mammogram for malignant neoplasm of breast: Secondary | ICD-10-CM | POA: Diagnosis not present

## 2017-08-29 DIAGNOSIS — E6609 Other obesity due to excess calories: Secondary | ICD-10-CM | POA: Diagnosis not present

## 2017-08-29 DIAGNOSIS — Z1159 Encounter for screening for other viral diseases: Secondary | ICD-10-CM | POA: Diagnosis not present

## 2017-08-29 DIAGNOSIS — D649 Anemia, unspecified: Secondary | ICD-10-CM

## 2017-08-29 DIAGNOSIS — R600 Localized edema: Secondary | ICD-10-CM | POA: Diagnosis not present

## 2017-08-29 DIAGNOSIS — R739 Hyperglycemia, unspecified: Secondary | ICD-10-CM

## 2017-08-29 DIAGNOSIS — E559 Vitamin D deficiency, unspecified: Secondary | ICD-10-CM | POA: Insufficient documentation

## 2017-08-29 LAB — CBC WITH DIFFERENTIAL/PLATELET
Basophils Absolute: 0 10*3/uL (ref 0.0–0.1)
Basophils Relative: 0.6 % (ref 0.0–3.0)
EOS ABS: 0.2 10*3/uL (ref 0.0–0.7)
EOS PCT: 2.2 % (ref 0.0–5.0)
HEMATOCRIT: 37.8 % (ref 36.0–46.0)
HEMOGLOBIN: 12.9 g/dL (ref 12.0–15.0)
LYMPHS PCT: 40 % (ref 12.0–46.0)
Lymphs Abs: 2.9 10*3/uL (ref 0.7–4.0)
MCHC: 34.3 g/dL (ref 30.0–36.0)
MCV: 86.5 fl (ref 78.0–100.0)
MONOS PCT: 5 % (ref 3.0–12.0)
Monocytes Absolute: 0.4 10*3/uL (ref 0.1–1.0)
Neutro Abs: 3.7 10*3/uL (ref 1.4–7.7)
Neutrophils Relative %: 52.2 % (ref 43.0–77.0)
Platelets: 328 10*3/uL (ref 150.0–400.0)
RBC: 4.37 Mil/uL (ref 3.87–5.11)
RDW: 14.6 % (ref 11.5–15.5)
WBC: 7.2 10*3/uL (ref 4.0–10.5)

## 2017-08-29 LAB — COMPREHENSIVE METABOLIC PANEL
ALBUMIN: 4.3 g/dL (ref 3.5–5.2)
ALT: 24 U/L (ref 0–35)
AST: 21 U/L (ref 0–37)
Alkaline Phosphatase: 75 U/L (ref 39–117)
BUN: 16 mg/dL (ref 6–23)
CALCIUM: 9.7 mg/dL (ref 8.4–10.5)
CHLORIDE: 101 meq/L (ref 96–112)
CO2: 25 meq/L (ref 19–32)
CREATININE: 0.79 mg/dL (ref 0.40–1.20)
GFR: 82.19 mL/min (ref >60.00)
Glucose, Bld: 138 mg/dL — ABNORMAL HIGH (ref 70–99)
POTASSIUM: 3.6 meq/L (ref 3.5–5.1)
Sodium: 137 mEq/L (ref 135–145)
Total Bilirubin: 0.3 mg/dL (ref 0.2–1.2)
Total Protein: 7.5 g/dL (ref 6.0–8.3)

## 2017-08-29 LAB — HEMOGLOBIN A1C: Hgb A1c MFr Bld: 6.7 % — ABNORMAL HIGH (ref 4.6–6.5)

## 2017-08-29 MED ORDER — HYDROCHLOROTHIAZIDE 12.5 MG PO TABS: 12.5000 mg | ORAL_TABLET | Freq: Every day | ORAL | 0 refills | Status: DC

## 2017-08-29 NOTE — Progress Notes (Signed)
Pre visit review using our clinic review tool, if applicable. No additional management support is needed unless otherwise documented below in the visit note. 

## 2017-08-29 NOTE — Patient Instructions (Addendum)
Stop Norvasc and Estradiol  Take HCTZ 12.5 mg in am  sch f/u fasting cholesterol and with PCP in 4-6 weeks  Edema Edema is an abnormal buildup of fluids in your bodytissues. Edema is somewhatdependent on gravity to pull the fluid to the lowest place in your body. That makes the condition more common in the legs and thighs (lower extremities). Painless swelling of the feet and ankles is common and becomes more likely as you get older. It is also common in looser tissues, like around your eyes. When the affected area is squeezed, the fluid may move out of that spot and leave a dent for a few moments. This dent is called pitting. What are the causes? There are many possible causes of edema. Eating too much salt and being on your feet or sitting for a long time can cause edema in your legs and ankles. Hot weather may make edema worse. Common medical causes of edema include:  Heart failure.  Liver disease.  Kidney disease.  Weak blood vessels in your legs.  Cancer.  An injury.  Pregnancy.  Some medications.  Obesity.  What are the signs or symptoms? Edema is usually painless.Your skin may look swollen or shiny. How is this diagnosed? Your health care provider may be able to diagnose edema by asking about your medical history and doing a physical exam. You may need to have tests such as X-rays, an electrocardiogram, or blood tests to check for medical conditions that may cause edema. How is this treated? Edema treatment depends on the cause. If you have heart, liver, or kidney disease, you need the treatment appropriate for these conditions. General treatment may include:  Elevation of the affected body part above the level of your heart.  Compression of the affected body part. Pressure from elastic bandages or support stockings squeezes the tissues and forces fluid back into the blood vessels. This keeps fluid from entering the tissues.  Restriction of fluid and salt  intake.  Use of a water pill (diuretic). These medications are appropriate only for some types of edema. They pull fluid out of your body and make you urinate more often. This gets rid of fluid and reduces swelling, but diuretics can have side effects. Only use diuretics as directed by your health care provider.  Follow these instructions at home:  Keep the affected body part above the level of your heart when you are lying down.  Do not sit still or stand for prolonged periods.  Do not put anything directly under your knees when lying down.  Do not wear constricting clothing or garters on your upper legs.  Exercise your legs to work the fluid back into your blood vessels. This may help the swelling go down.  Wear elastic bandages or support stockings to reduce ankle swelling as directed by your health care provider.  Eat a low-salt diet to reduce fluid if your health care provider recommends it.  Only take medicines as directed by your health care provider. Contact a health care provider if:  Your edema is not responding to treatment.  You have heart, liver, or kidney disease and notice symptoms of edema.  You have edema in your legs that does not improve after elevating them.  You have sudden and unexplained weight gain. Get help right away if:  You develop shortness of breath or chest pain.  You cannot breathe when you lie down.  You develop pain, redness, or warmth in the swollen areas.  You have  heart, liver, or kidney disease and suddenly get edema.  You have a fever and your symptoms suddenly get worse. This information is not intended to replace advice given to you by your health care provider. Make sure you discuss any questions you have with your health care provider. Document Released: 03/04/2005 Document Revised: 08/10/2015 Document Reviewed: 12/25/2012 Elsevier Interactive Patient Education  2017 [employer]  Hypertension Hypertension, commonly called  high blood pressure, is when the force of blood pumping through the arteries is too strong. The arteries are the blood vessels that carry blood from the heart throughout the body. Hypertension forces the heart to work harder to pump blood and may cause arteries to become narrow or stiff. Having untreated or uncontrolled hypertension can cause heart attacks, strokes, kidney disease, and other problems. A blood pressure reading consists of a higher number over a lower number. Ideally, your blood pressure should be below 120/80. The first ("top") number is called the systolic pressure. It is a measure of the pressure in your arteries as your heart beats. The second ("bottom") number is called the diastolic pressure. It is a measure of the pressure in your arteries as the heart relaxes. What are the causes? The cause of this condition is not known. What increases the risk? Some risk factors for high blood pressure are under your control. Others are not. Factors you can change  Smoking.  Having type 2 diabetes mellitus, high cholesterol, or both.  Not getting enough exercise or physical activity.  Being overweight.  Having too much fat, sugar, calories, or salt (sodium) in your diet.  Drinking too much alcohol. Factors that are difficult or impossible to change  Having chronic kidney disease.  Having a family history of high blood pressure.  Age. Risk increases with age.  Race. You may be at higher risk if you are African-American.  Gender. Men are at higher risk than women before age 26. After age 57, women are at higher risk than men.  Having obstructive sleep apnea.  Stress. What are the signs or symptoms? Extremely high blood pressure (hypertensive crisis) may cause:  Headache.  Anxiety.  Shortness of breath.  Nosebleed.  Nausea and vomiting.  Severe chest pain.  Jerky movements you cannot control (seizures).  How is this diagnosed? This condition is diagnosed by  measuring your blood pressure while you are seated, with your arm resting on a surface. The cuff of the blood pressure monitor will be placed directly against the skin of your upper arm at the level of your heart. It should be measured at least twice using the same arm. Certain conditions can cause a difference in blood pressure between your right and left arms. Certain factors can cause blood pressure readings to be lower or higher than normal (elevated) for a short period of time:  When your blood pressure is higher when you are in a health care provider's office than when you are at home, this is called white coat hypertension. Most people with this condition do not need medicines.  When your blood pressure is higher at home than when you are in a health care provider's office, this is called masked hypertension. Most people with this condition may need medicines to control blood pressure.  If you have a high blood pressure reading during one visit or you have normal blood pressure with other risk factors:  You may be asked to return on a different day to have your blood pressure checked again.  You may  be asked to monitor your blood pressure at home for 1 week or longer.  If you are diagnosed with hypertension, you may have other blood or imaging tests to help your health care provider understand your overall risk for other conditions. How is this treated? This condition is treated by making healthy lifestyle changes, such as eating healthy foods, exercising more, and reducing your alcohol intake. Your health care provider may prescribe medicine if lifestyle changes are not enough to get your blood pressure under control, and if:  Your systolic blood pressure is above 130.  Your diastolic blood pressure is above 80.  Your personal target blood pressure may vary depending on your medical conditions, your age, and other factors. Follow these instructions at home: Eating and drinking  Eat a  diet that is high in fiber and potassium, and low in sodium, added sugar, and fat. An example eating plan is called the DASH (Dietary Approaches to Stop Hypertension) diet. To eat this way: ? Eat plenty of fresh fruits and vegetables. Try to fill half of your plate at each meal with fruits and vegetables. ? Eat whole grains, such as whole wheat pasta, brown rice, or whole grain bread. Fill about one quarter of your plate with whole grains. ? Eat or drink low-fat dairy products, such as skim milk or low-fat yogurt. ? Avoid fatty cuts of meat, processed or cured meats, and poultry with skin. Fill about one quarter of your plate with lean proteins, such as fish, chicken without skin, beans, eggs, and tofu. ? Avoid premade and processed foods. These tend to be higher in sodium, added sugar, and fat.  Reduce your daily sodium intake. Most people with hypertension should eat less than 1,500 mg of sodium a day.  Limit alcohol intake to no more than 1 drink a day for nonpregnant women and 2 drinks a day for men. One drink equals 12 oz of beer, 5 oz of wine, or 1 oz of hard liquor. Lifestyle  Work with your health care provider to maintain a healthy body weight or to lose weight. Ask what an ideal weight is for you.  Get at least 30 minutes of exercise that causes your heart to beat faster (aerobic exercise) most days of the week. Activities may include walking, swimming, or biking.  Include exercise to strengthen your muscles (resistance exercise), such as pilates or lifting weights, as part of your weekly exercise routine. Try to do these types of exercises for 30 minutes at least 3 days a week.  Do not use any products that contain nicotine or tobacco, such as cigarettes and e-cigarettes. If you need help quitting, ask your health care provider.  Monitor your blood pressure at home as told by your health care provider.  Keep all follow-up visits as told by your health care provider. This is  important. Medicines  Take over-the-counter and prescription medicines only as told by your health care provider. Follow directions carefully. Blood pressure medicines must be taken as prescribed.  Do not skip doses of blood pressure medicine. Doing this puts you at risk for problems and can make the medicine less effective.  Ask your health care provider about side effects or reactions to medicines that you should watch for. Contact a health care provider if:  You think you are having a reaction to a medicine you are taking.  You have headaches that keep coming back (recurring).  You feel dizzy.  You have swelling in your ankles.  You have  trouble with your vision. Get help right away if:  You develop a severe headache or confusion.  You have unusual weakness or numbness.  You feel faint.  You have severe pain in your chest or abdomen.  You vomit repeatedly.  You have trouble breathing. Summary  Hypertension is when the force of blood pumping through your arteries is too strong. If this condition is not controlled, it may put you at risk for serious complications.  Your personal target blood pressure may vary depending on your medical conditions, your age, and other factors. For most people, a normal blood pressure is less than 120/80.  Hypertension is treated with lifestyle changes, medicines, or a combination of both. Lifestyle changes include weight loss, eating a healthy, low-sodium diet, exercising more, and limiting alcohol. This information is not intended to replace advice given to you by your health care provider. Make sure you discuss any questions you have with your health care provider. Document Released: 03/04/2005 Document Revised: 01/31/2016 Document Reviewed: 01/31/2016 Elsevier Interactive Patient Education  [employer].

## 2017-08-29 NOTE — Progress Notes (Signed)
Chief Complaint  Patient presents with  . Hypertension   Acute visit  1.BP controlled today 128/82 on norvasc 2.5 qd, toprol xl 25 mg qd but has been 140s/100s and having pm h/a and legs were swollen over the weekend and she reports she was eating out at restaurants due to looking at colleges over the weekend so could have consumed increased salt.  2. Leg swelling L>R w/o pain 3. S/p hysterectomy 04/2017 with Dr. Enzo Bi h/o total hysterectomy on estrace 1 mg since 05/21/17 and wonders if she can stop not really having hot flashes.  FH uterine cancer in mother still living.  4. Overdue mammogram  Review of Systems  Constitutional: Negative for weight loss.  HENT: Negative for hearing loss.   Eyes: Negative for blurred vision.  Respiratory: Negative for shortness of breath.   Cardiovascular: Positive for leg swelling.  Skin: Negative for rash.  Neurological: Positive for headaches.  Psychiatric/Behavioral: Negative for depression.   Past Medical History:  Diagnosis Date  . Carpal tunnel syndrome on both sides   . Complication of anesthesia   . Depression   . Family history of adverse reaction to anesthesia    N/V  . GERD (gastroesophageal reflux disease)   . Heart murmur    DURING PREGNANCY  . Hyperlipidemia   . Hypertension   . Obesity   . PONV (postoperative nausea and vomiting)   . Sleep apnea 2011   using CPAP 6 cm H20  [facility] Sleep Study  . Vertigo    Past Surgical History:  Procedure Laterality Date  . BREAST SURGERY  2003   negative biopsy  . CHOLECYSTECTOMY  1995  . HYSTERECTOMY ABDOMINAL WITH SALPINGECTOMY    . LAPAROSCOPIC VAGINAL HYSTERECTOMY WITH SALPINGO OOPHORECTOMY Bilateral 04/28/2017   Procedure: LAPAROSCOPIC ASSISTED VAGINAL HYSTERECTOMY WITH BILATERAL SALPINGO OOPHORECTOMY with Cystoscopy;  Surgeon: Brayton Mars, MD;  Location: [facility] ORS;  Service: Gynecology;  Laterality: Bilateral;  . OVARIAN CYST REMOVAL    . TONSILLECTOMY AND ADENOIDECTOMY   2003  . TUBAL LIGATION    . UPPER EXTREMITY ANGIOGRAPHY Left 05/19/2017   Procedure: UPPER EXTREMITY ANGIOGRAPHY;  Surgeon: Algernon Huxley, MD;  Location: [facility];  Service: Cardiovascular;  Laterality: Left;   Family History  Problem Relation Age of Onset  . Hypertension Mother   . Diabetes Mother   . Uterine cancer Mother   . Hyperlipidemia Father   . Breast cancer Neg Hx   . Ovarian cancer Neg Hx   . Colon cancer Neg Hx    Social History   Socioeconomic History  . Marital status: Married    Spouse name: Not on file  . Number of children: Not on file  . Years of education: Not on file  . Highest education level: Not on file  Occupational History  . Not on file  Social Needs  . Financial resource strain: Not on file  . Food insecurity:    Worry: Not on file    Inability: Not on file  . Transportation needs:    Medical: Not on file    Non-medical: Not on file  Tobacco Use  . Smoking status: Former Smoker    Packs/day: 0.25    Years: 10.00    Pack years: 2.50    Types: Cigarettes    Start date: 05/17/2011  . Smokeless tobacco: Never Used  Substance and Sexual Activity  . Alcohol use: Yes    Alcohol/week: 1.8 oz    Types: 3 Glasses of wine  per week    Comment: occas  . Drug use: No  . Sexual activity: Yes    Birth control/protection: Surgical  Lifestyle  . Physical activity:    Days per week: Not on file    Minutes per session: Not on file  . Stress: Not on file  Relationships  . Social connections:    Talks on phone: Not on file    Gets together: Not on file    Attends religious service: Not on file    Active member of club or organization: Not on file    Attends meetings of clubs or organizations: Not on file    Relationship status: Not on file  . Intimate partner violence:    Fear of current or ex partner: Not on file    Emotionally abused: Not on file    Physically abused: Not on file    Forced sexual activity: Not on file  Other Topics  Concern  . Not on file  Social History Narrative  . Not on file   No outpatient medications have been marked as taking for the 08/29/17 encounter (Office Visit) with McLean-Scocuzza, Nino Glow, MD.   No Known Allergies No results found for this or any previous visit (from the past 2160 hour(s)). Objective  Body mass index is 39.61 kg/m. Wt Readings from Last 3 Encounters:  08/29/17 223 lb 9.6 oz (101.4 kg)  05/19/17 208 lb (94.3 kg)  05/13/17 208 lb 3.2 oz (94.4 kg)   Temp Readings from Last 3 Encounters:  08/29/17 98.1 F (36.7 C) (Oral)  05/19/17 98.4 F (36.9 C) (Oral)  04/29/17 97.9 F (36.6 C) (Oral)   BP Readings from Last 3 Encounters:  08/29/17 128/82  05/19/17 121/88  05/13/17 128/81   Pulse Readings from Last 3 Encounters:  08/29/17 70  05/19/17 70  05/13/17 85    Physical Exam  Constitutional: She is oriented to person, place, and time. Vital signs are normal. She appears well-developed and well-nourished. She is cooperative.  HENT:  Head: Normocephalic and atraumatic.  Mouth/Throat: Oropharynx is clear and moist and mucous membranes are normal.  Eyes: Pupils are equal, round, and reactive to light. Conjunctivae are normal.  Cardiovascular: Normal rate, regular rhythm and normal heart sounds.  1+ edema L>R  Pulmonary/Chest: Effort normal and breath sounds normal.  Neurological: She is alert and oriented to person, place, and time. Gait normal.  Skin: Skin is warm, dry and intact.  Tanned skin   Psychiatric: She has a normal mood and affect. Her speech is normal and behavior is normal. Judgment and thought content normal. Cognition and memory are normal.  Nursing note and vitals reviewed.   Assessment   1.HTN elevated at home controlled today here  2. Left edema trace L>R on estrace sicne 05/21/17  3. Obesity 39.61class 2 4. HM Plan   1. Stop norvasc 2.5, add hctz 12.5 mg qam cont toprol XL 25 rec purchase new BP cuff  2.  Labs today  Check D dimer  r/o DVT h/o estrace use orally though low susp.  Trial of above  Reduce salt intake  3. Disc with PCP  4.  Comp Labs today return for lipid S/p total hysterectomy 04/2017 Dr. Enzo Bi doing well no h/o abnormal pap  Referred mammogram today  F/u with PCP in 4-6 weeks   Provider: Dr. Olivia Mackie McLean-Scocuzza-[facility]

## 2017-09-01 ENCOUNTER — Other Ambulatory Visit: Payer: Self-pay | Admitting: Internal Medicine

## 2017-09-01 ENCOUNTER — Telehealth: Payer: Self-pay | Admitting: *Deleted

## 2017-09-01 DIAGNOSIS — E119 Type 2 diabetes mellitus without complications: Secondary | ICD-10-CM

## 2017-09-01 DIAGNOSIS — E669 Obesity, unspecified: Secondary | ICD-10-CM | POA: Insufficient documentation

## 2017-09-01 DIAGNOSIS — I10 Essential (primary) hypertension: Secondary | ICD-10-CM

## 2017-09-01 DIAGNOSIS — E1159 Type 2 diabetes mellitus with other circulatory complications: Secondary | ICD-10-CM | POA: Insufficient documentation

## 2017-09-01 DIAGNOSIS — E559 Vitamin D deficiency, unspecified: Secondary | ICD-10-CM

## 2017-09-01 DIAGNOSIS — E1169 Type 2 diabetes mellitus with other specified complication: Secondary | ICD-10-CM

## 2017-09-01 DIAGNOSIS — E785 Hyperlipidemia, unspecified: Secondary | ICD-10-CM

## 2017-09-01 LAB — HEPATITIS B SURFACE ANTIBODY, QUANTITATIVE: Hepatitis B-Post: <5 m[IU]/mL — ABNORMAL LOW (ref >=10)

## 2017-09-01 LAB — MEASLES/MUMPS/RUBELLA IMMUNITY
Mumps IgG: 106 AU/mL
RUBELLA: 1.38 {index}

## 2017-09-01 LAB — VITAMIN D 25 HYDROXY (VIT D DEFICIENCY, FRACTURES): VITD: 19.38 ng/mL — AB (ref 30.00–100.00)

## 2017-09-01 LAB — D-DIMER, QUANTITATIVE: D-Dimer, Quant: 0.37 mcg/mL FEU (ref <0.50)

## 2017-09-01 MED ORDER — CHOLECALCIFEROL 1.25 MG (50000 UT) PO CAPS: 50000.0000 [IU] | ORAL_CAPSULE | ORAL | 1 refills | Status: DC

## 2017-09-01 NOTE — Telephone Encounter (Signed)
Pt has lab appt on 09/02/17. Please place future orders. Thanks

## 2017-09-02 ENCOUNTER — Other Ambulatory Visit: Payer: BLUE CROSS/BLUE SHIELD

## 2017-09-02 ENCOUNTER — Other Ambulatory Visit: Payer: Self-pay | Admitting: Internal Medicine

## 2017-09-02 ENCOUNTER — Encounter: Payer: Self-pay | Admitting: Internal Medicine

## 2017-09-02 DIAGNOSIS — E119 Type 2 diabetes mellitus without complications: Secondary | ICD-10-CM

## 2017-09-02 MED ORDER — METFORMIN HCL 500 MG PO TABS: 500.0000 mg | ORAL_TABLET | Freq: Every day | ORAL | 1 refills | Status: DC

## 2017-09-02 NOTE — Telephone Encounter (Signed)
Fasting labs and urine ordered for new diagnosis of type 2 dm.  Please make sure patient schedules an appointment with me

## 2017-09-02 NOTE — Telephone Encounter (Signed)
Pt rescheduled lab appt for 6/29, & has a f/u appt with pcp in July.

## 2017-09-04 ENCOUNTER — Other Ambulatory Visit: Payer: Self-pay | Admitting: Internal Medicine

## 2017-09-04 DIAGNOSIS — E119 Type 2 diabetes mellitus without complications: Secondary | ICD-10-CM

## 2017-09-10 ENCOUNTER — Other Ambulatory Visit: Payer: BLUE CROSS/BLUE SHIELD

## 2017-10-13 ENCOUNTER — Other Ambulatory Visit: Payer: Self-pay | Admitting: Internal Medicine

## 2017-10-15 ENCOUNTER — Ambulatory Visit: Payer: BLUE CROSS/BLUE SHIELD | Admitting: Internal Medicine

## 2017-10-15 DIAGNOSIS — Z0289 Encounter for other administrative examinations: Secondary | ICD-10-CM

## 2017-11-24 ENCOUNTER — Encounter: Payer: Self-pay | Admitting: Internal Medicine

## 2017-11-24 ENCOUNTER — Ambulatory Visit (INDEPENDENT_AMBULATORY_CARE_PROVIDER_SITE_OTHER): Payer: BLUE CROSS/BLUE SHIELD | Admitting: Internal Medicine

## 2017-11-24 VITALS — BP 144/84 | HR 75 | Temp 97.8°F | Resp 15 | Ht 63.0 in | Wt 222.6 lb

## 2017-11-24 DIAGNOSIS — E119 Type 2 diabetes mellitus without complications: Secondary | ICD-10-CM | POA: Diagnosis not present

## 2017-11-24 DIAGNOSIS — R600 Localized edema: Secondary | ICD-10-CM

## 2017-11-24 DIAGNOSIS — Z23 Encounter for immunization: Secondary | ICD-10-CM

## 2017-11-24 DIAGNOSIS — E6609 Other obesity due to excess calories: Secondary | ICD-10-CM

## 2017-11-24 DIAGNOSIS — N924 Excessive bleeding in the premenopausal period: Secondary | ICD-10-CM | POA: Diagnosis not present

## 2017-11-24 DIAGNOSIS — Z6834 Body mass index (BMI) 34.0-34.9, adult: Secondary | ICD-10-CM

## 2017-11-24 DIAGNOSIS — I10 Essential (primary) hypertension: Secondary | ICD-10-CM

## 2017-11-24 MED ORDER — HYDROCHLOROTHIAZIDE 25 MG PO TABS: 25.0000 mg | ORAL_TABLET | Freq: Every day | ORAL | 1 refills | Status: DC

## 2017-11-24 NOTE — Progress Notes (Signed)
Subjective:  Patient ID: Christina Stafford, female    DOB: February 18, 1971  Age: 47 y.o. MRN: [id number]  CC: The primary encounter diagnosis was Type 2 diabetes mellitus without complication, without long-term current use of insulin (Christina Stafford). Diagnoses of Essential hypertension, Leg edema, Need for influenza vaccination, Excessive bleeding in premenopausal period, and Class 1 obesity due to excess calories with serious comorbidity and body mass index (BMI) of 34.0 to 34.9 in adult were also pertinent to this visit.  HPI Christina Stafford presents for follow up on new onset diabetes. Diagnosed in June by Dr Aundra Dubin. Has not been seen in one year.   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   Patient has no complaints today.  Patient is  Taking metformin daily in the morning and after the first week has been tolerating it  Without GI side effects.  following a low glycemic index diet and taking all prescribed medications regularly without side effects.  Not checking sugars yet  Patient is not exercising regularly  Or  intentionally trying to lose weight .  Patient has not had an eye exam in the last 12 months and checks feet regularly for signs of infection.  Patient does not walk barefoot outside,  And denies any numbness tingling or burning in feet. Patient is up to date on all recommended vaccinations stopped it in June   S/p total hysterectomy/BSO  In January by DeFrancesco  Uncomplicated post op . Took estrogen for several months but has stopped taking it.  Not smoking   Lab Results  Component Value Date   MICROALBUR <0.7 11/24/2017     Lab Results  Component Value Date   HGBA1C 6.7 (H) 08/29/2017     Outpatient Medications Prior to Visit  Medication Sig Dispense Refill  . ALPRAZolam (XANAX) 0.5 MG tablet Take 1 tablet (0.5 mg total) by mouth daily as needed for anxiety (or panic attacks). 30 tablet 2  .  esomeprazole (NEXIUM) 20 MG capsule Take 20 mg by mouth every morning.     . metFORMIN (GLUCOPHAGE) 500 MG tablet Take 1 tablet (500 mg total) by mouth daily with breakfast. 90 tablet 1  . metoprolol succinate (TOPROL-XL) 25 MG 24 hr tablet TAKE 1 TABLET(25 MG) BY MOUTH DAILY 90 tablet 1  . sertraline (ZOLOFT) 50 MG tablet TAKE 1 AND 1/2 TABLETS BY MOUTH IN THE EVENING 135 tablet 1  . tetrahydrozoline 0.05 % ophthalmic solution Place 2 drops into both eyes as needed (dry eyes).    . hydrochlorothiazide (HYDRODIURIL) 12.5 MG tablet Take 1 tablet (12.5 mg total) by mouth daily. 90 tablet 0  . cetirizine (ZYRTEC) 10 MG tablet Take 10 mg by mouth daily.     . Cholecalciferol 50000 units capsule Take 1 capsule (50,000 Units total) by mouth once a week. (Patient not taking: Reported on 11/24/2017) 13 capsule 1   No facility-administered medications prior to visit.     Review of Systems;  Patient denies headache, fevers, malaise, unintentional weight loss, skin rash, eye pain, sinus congestion and sinus pain, sore throat, dysphagia,  hemoptysis , cough, dyspnea, wheezing, chest pain, palpitations, orthopnea, edema, abdominal pain, nausea, melena, diarrhea, constipation, flank pain, dysuria, hematuria, urinary  Frequency, nocturia, numbness, tingling, seizures,  Focal weakness, Loss of consciousness,  Tremor, insomnia, depression, anxiety, and suicidal ideation.      Objective:  BP (!) 144/84 (BP Location: Left  Arm, Patient Position: Sitting, Cuff Size: Normal)   Pulse 75   Temp 97.8 F (36.6 C) (Oral)   Resp 15   Ht 5' 3"  (1.6 m)   Wt 222 lb 9.6 oz (101 kg)   LMP 01/20/2017 (Approximate)   SpO2 96%   BMI 39.43 kg/m   BP Readings from Last 3 Encounters:  11/24/17 (!) 144/84  08/29/17 128/82  05/19/17 121/88    Wt Readings from Last 3 Encounters:  11/24/17 222 lb 9.6 oz (101 kg)  08/29/17 223 lb 9.6 oz (101.4 kg)  05/19/17 208 lb (94.3 kg)    General appearance: alert, cooperative and  appears stated age Ears: normal TM's and external ear canals both ears Throat: lips, mucosa, and tongue normal; teeth and gums normal Neck: no adenopathy, no carotid bruit, supple, symmetrical, trachea midline and thyroid not enlarged, symmetric, no tenderness/mass/nodules Back: symmetric, no curvature. ROM normal. No CVA tenderness. Lungs: clear to auscultation bilaterally Heart: regular rate and rhythm, S1, S2 normal, no murmur, click, rub or gallop Abdomen: soft, non-tender; bowel sounds normal; no masses,  no organomegaly Pulses: 2+ and symmetric Skin: Skin color, texture, turgor normal. No rashes or lesions Lymph nodes: Cervical, supraclavicular, and axillary nodes normal.  Lab Results  Component Value Date   HGBA1C 6.7 (H) 08/29/2017   HGBA1C 5.3 10/12/2014    Lab Results  Component Value Date   CREATININE 0.79 08/29/2017   CREATININE 0.84 05/19/2017   CREATININE 0.95 10/15/2016    Lab Results  Component Value Date   WBC 7.2 08/29/2017   HGB 12.9 08/29/2017   HCT 37.8 08/29/2017   PLT 328.0 08/29/2017   GLUCOSE 138 (H) 08/29/2017   CHOL 327 (H) 06/07/2015   TRIG 162.0 (H) 06/07/2015   HDL 64.20 06/07/2015   LDLDIRECT 176.5 05/21/2012   LDLCALC 230 (H) 06/07/2015   ALT 24 08/29/2017   AST 21 08/29/2017   NA 137 08/29/2017   K 3.6 08/29/2017   CL 101 08/29/2017   CREATININE 0.79 08/29/2017   BUN 16 08/29/2017   CO2 25 08/29/2017   TSH 2.89 10/15/2016   HGBA1C 6.7 (H) 08/29/2017   MICROALBUR <0.7 11/24/2017    No results found.  Assessment & Plan:   Problem List Items Addressed This Visit    DM2 (diabetes mellitus, type 2) ([place]) - Primary    New diagnosis. Tolerating metformin. Counselling on diet and exercise done today  Glucometer given for instructional use to help guide her dietary efforts.   Lab Results  Component Value Date   HGBA1C 6.7 (H) 08/29/2017   Lab Results  Component Value Date   MICROALBUR <0.7 11/24/2017        Relevant Orders     Microalbumin / creatinine urine ratio (Completed)   RESOLVED: Heavy menses   Hypertension    Increasing hctz to 25 mg daily   Lab Results  Component Value Date   CREATININE 0.79 08/29/2017   Lab Results  Component Value Date   NA 137 08/29/2017   K 3.6 08/29/2017   CL 101 08/29/2017   CO2 25 08/29/2017         Relevant Medications   hydrochlorothiazide (HYDRODIURIL) 25 MG tablet   Obesity    I have addressed  BMI and recommended wt loss of 10% of body weight over the next 6 months using a low glycemic index diet and regular exercise a minimum of 5 days per week.         Other Visit Diagnoses  Leg edema       Relevant Medications   hydrochlorothiazide (HYDRODIURIL) 25 MG tablet   Need for influenza vaccination       Relevant Orders   Flu Vaccine QUAD 6+ mos PF IM (Fluarix Quad PF) (Completed)    A total of 40 minutes of face to face time was spent with patient more than half of which was spent in counselling about the above mentioned conditions  and coordination of care   I have discontinued Starla F. Chipps's Cholecalciferol. I have also changed her hydrochlorothiazide. Additionally, I am having her maintain her cetirizine, esomeprazole, ALPRAZolam, tetrahydrozoline, metoprolol succinate, metFORMIN, and sertraline.  Meds ordered this encounter  Medications  . hydrochlorothiazide (HYDRODIURIL) 25 MG tablet    Sig: Take 1 tablet (25 mg total) by mouth daily.    Dispense:  90 tablet    Refill:  1    Generic d/c norvasc 2.5    Medications Discontinued During This Encounter  Medication Reason  . Cholecalciferol 50000 units capsule Completed Course  . hydrochlorothiazide (HYDRODIURIL) 12.5 MG tablet     Follow-up: Return in about 6 months (around 05/25/2018) for fasting labs in October .   Crecencio Mc, MD

## 2017-11-24 NOTE — Patient Instructions (Addendum)
1) Ask Dr. Matilde Sprang to give you a "diabetic eye exam"  Annually  2) Increase your dose of hctz to 25 mg daily for blood pressure/swelling   3) Return in October or fasting labs,  Return to see me in 6 months (unless you A1c has risen)   3) Finding reduced sugar breakfasts and breads ,  And exercising regularly will help control your diabetes without more medications:.  You might want to try a premixed protein drink called Premier Protein shake for breakfast or late night snack . It is great tasting,   very low sugar and available of < $2 serving at [facility], [facility], [facility] and  In bulk for $1.50/serving at [employer] and [employer]  .    Nutritional analysis :  160 cal  30 g protein  1 g sugar 50% calcium needs   Vladimir Faster and [employer]  Danton Clap now makes a frozen breakfast frittata that can be microwaved in 2 minutes and is very low carb. Frittats are similar to quiches without the crust  Also the "Eggwhich" is a great choice to [employer] makes Just Crack an Egg  Try finding "Marchia Bond"   for a great tasting low carb bread (look in the frozen bread section at [facility])     To make a low carb chip :  Take the Joseph's Lavash or Pita bread,  Or the [place] Low carb whole wheat tortilla   Place on metal cookie sheet  Brush with olive oil  Sprinkle garlic powder (NOT garlic salt), grated parmesan cheese, mediterranean seasoning , or all of them?  Bake at 275 for 30 minutes   We have substitutions for your potatoes!!  Try the mashed cauliflower and riced cauliflower dishes instead of rice and mashed potatoes  Mashed turnips are also very low carb!   For desserts :  Try the Dannon [employer] n Fit greek yogurt dessert flavors and top with reddi Whip .  8 carbs,  80 calories  Try Oikos Triple Zero [place] Yogurt in the salted caramel, and the coffee flavors  With Whipped Cream for dessert  breyer's low carb ice cream, available in bars (on a stick, better ) or scoopable ice cream  HERE ARE THE LOW CARB  BREAD  CHOICES

## 2017-11-25 ENCOUNTER — Other Ambulatory Visit: Payer: Self-pay

## 2017-11-25 LAB — MICROALBUMIN / CREATININE URINE RATIO
Creatinine,U: 24.1 mg/dL
MICROALB/CREAT RATIO: 2.9 mg/g (ref 0.0–30.0)

## 2017-11-25 NOTE — Assessment & Plan Note (Addendum)
New diagnosis. Tolerating metformin. Counselling on diet and exercise done today  Glucometer given for instructional use to help guide her dietary efforts.   Lab Results  Component Value Date   HGBA1C 6.7 (H) 08/29/2017   Lab Results  Component Value Date   MICROALBUR <0.7 11/24/2017

## 2017-11-25 NOTE — Assessment & Plan Note (Signed)
Increasing hctz to 25 mg daily   Lab Results  Component Value Date   CREATININE 0.79 08/29/2017   Lab Results  Component Value Date   NA 137 08/29/2017   K 3.6 08/29/2017   CL 101 08/29/2017   CO2 25 08/29/2017

## 2017-11-25 NOTE — Assessment & Plan Note (Signed)
I have addressed  BMI and recommended wt loss of 10% of body weight over the next 6 months using a low glycemic index diet and regular exercise a minimum of 5 days per week.   

## 2017-12-27 ENCOUNTER — Other Ambulatory Visit: Payer: Self-pay | Admitting: Internal Medicine

## 2017-12-29 ENCOUNTER — Other Ambulatory Visit (INDEPENDENT_AMBULATORY_CARE_PROVIDER_SITE_OTHER): Payer: BLUE CROSS/BLUE SHIELD

## 2017-12-29 DIAGNOSIS — I10 Essential (primary) hypertension: Secondary | ICD-10-CM

## 2017-12-29 DIAGNOSIS — E785 Hyperlipidemia, unspecified: Secondary | ICD-10-CM | POA: Diagnosis not present

## 2017-12-29 LAB — BASIC METABOLIC PANEL
BUN: 18 mg/dL (ref 6–23)
CALCIUM: 9.6 mg/dL (ref 8.4–10.5)
CHLORIDE: 101 meq/L (ref 96–112)
CO2: 28 meq/L (ref 19–32)
CREATININE: 0.86 mg/dL (ref 0.40–1.20)
GFR: 74.42 mL/min (ref >60.00)
Glucose, Bld: 133 mg/dL — ABNORMAL HIGH (ref 70–99)
Potassium: 4.1 mEq/L (ref 3.5–5.1)
Sodium: 139 mEq/L (ref 135–145)

## 2017-12-29 LAB — LIPID PANEL
CHOL/HDL RATIO: 7
Cholesterol: 368 mg/dL — ABNORMAL HIGH (ref 0–200)
HDL: 49.3 mg/dL (ref >39.00)
NONHDL: 318.81
TRIGLYCERIDES: 303 mg/dL — AB (ref 0.0–149.0)
VLDL: 60.6 mg/dL — ABNORMAL HIGH (ref 0.0–40.0)

## 2017-12-29 LAB — TSH: TSH: 4.57 u[IU]/mL — AB (ref 0.35–4.50)

## 2017-12-29 LAB — LDL CHOLESTEROL, DIRECT: LDL DIRECT: 289 mg/dL

## 2018-01-01 LAB — HM DIABETES EYE EXAM

## 2018-01-15 ENCOUNTER — Ambulatory Visit (INDEPENDENT_AMBULATORY_CARE_PROVIDER_SITE_OTHER): Payer: BLUE CROSS/BLUE SHIELD | Admitting: Family Medicine

## 2018-01-15 ENCOUNTER — Encounter: Payer: Self-pay | Admitting: Family Medicine

## 2018-01-15 VITALS — BP 132/84 | HR 93 | Temp 98.5°F | Ht 63.0 in | Wt 217.0 lb

## 2018-01-15 DIAGNOSIS — L0390 Cellulitis, unspecified: Secondary | ICD-10-CM

## 2018-01-15 DIAGNOSIS — L02818 Cutaneous abscess of other sites: Secondary | ICD-10-CM

## 2018-01-15 MED ORDER — SULFAMETHOXAZOLE-TRIMETHOPRIM 800-160 MG PO TABS
1.0000 | ORAL_TABLET | Freq: Two times a day (BID) | ORAL | 0 refills | Status: DC
Start: 2018-01-15 — End: 2018-03-13

## 2018-01-15 NOTE — Progress Notes (Signed)
Subjective:    Patient ID: Christina Stafford, female    DOB: [birth year], 47 y.o.   MRN: [id number]  HPI   Patient presents to clinic due to increased pubic hair.  Patient states she first noticed the area about 2 months ago, but it was small and not bothersome.  Patient does shave her pubic hair.  Patient states over the past week or so the area has grown in size and become tender to touch.  Denies fever or chills.  Denies any drainage from area.  Denies any past history of skin abscesses.  Patient Active Problem List   Diagnosis Date Noted  . DM2 (diabetes mellitus, type 2) ([place]) 09/01/2017  . Vitamin D deficiency 08/29/2017  . Left forearm pain 04/20/2017  . S/P laparoscopic assisted vaginal hysterectomy (LAVH) 11/21/2016  . Status post tubal ligation 11/21/2016  . Chronic left-sided low back pain 10/16/2016  . Generalized anxiety disorder 12/31/2015  . Encounter for preventive health examination 05/31/2015  . Heart murmur 11/13/2014  . PMS (premenstrual syndrome) 07/06/2013  . GERD (gastroesophageal reflux disease) 12/01/2012  . Panic attacks 11/04/2012  . History of tobacco abuse 03/26/2011  . OSA on CPAP   . Carpal tunnel syndrome on both sides   . Depression   . Hyperlipidemia   . Hypertension   . Obesity    Social History   Tobacco Use  . Smoking status: Former Smoker    Packs/day: 0.25    Years: 10.00    Pack years: 2.50    Types: Cigarettes    Start date: 05/17/2011  . Smokeless tobacco: Never Used  Substance Use Topics  . Alcohol use: Yes    Alcohol/week: 3.0 standard drinks    Types: 3 Glasses of wine per week    Comment: occas   Social History   Tobacco Use  . Smoking status: Former Smoker    Packs/day: 0.25    Years: 10.00    Pack years: 2.50    Types: Cigarettes    Start date: 05/17/2011  . Smokeless tobacco: Never Used  Substance Use Topics  . Alcohol use: Yes    Alcohol/week: 3.0 standard drinks    Types: 3 Glasses of wine per week   Comment: occas   Review of Systems  Constitutional: Negative for chills, fatigue and fever.  HENT: Negative for congestion, ear pain, sinus pain and sore throat.   Eyes: Negative.   Respiratory: Negative for cough, shortness of breath and wheezing.   Cardiovascular: Negative for chest pain, palpitations and leg swelling.  Gastrointestinal: Negative for abdominal pain, diarrhea, nausea and vomiting.  Genitourinary: Negative for dysuria, frequency and urgency.  Musculoskeletal: Negative for arthralgias and myalgias.  Skin: ingrown pubic hair  Neurological: Negative for syncope, light-headedness and headaches.  Psychiatric/Behavioral: The patient is not nervous/anxious.    Objective:   Physical Exam  Skin: There is erythema.     Red dot on diagram indicates location of abscess.  Is small, approximately size of marble.  Area cleansed with iodine, numbed with lidocaine 1%.  Small incision made using a #11 blade.  White pus and blood drained from area.  Area then cleansed with alcohol swab and dried.  Small amount of bacitracin placed over cut and covered with gauze and Band-Aid.  Constitutional:  She appears well-developed and well-nourished. No distress.  Head: Normocephalic and atraumatic.  Eyes: EOM are normal. No scleral icterus.  Neck: Normal range of motion. Neck supple. No tracheal deviation present.  Cardiovascular: Normal rate,  regular rhythm and normal heart sounds.  Pulmonary/Chest: Effort normal and breath sounds normal. No respiratory distress.  Neurological: She is alert and oriented to person, place, and time.  Gait normal  Psychiatric: She has a normal mood and affect. Her behavior is normal. Thought content normal.     Vitals:   01/15/18 1633  BP: 132/84  Pulse: 93  Temp: 98.5 F (36.9 C)  SpO2: 93%   Assessment & Plan:   Cutaneous abscess of skin and pubic area, cellulitis of skin- I&D performed with discharge of pus and blood.  Patient tolerated procedure well.   She will take Bactrim twice daily for 10 days to cover any bacterial infection and treat cellulitis.  Patient advised that if area does not improve as he would hope or returns in the future, she would need to see general surgery for full excision of the cyst sac.  Patient verbalized understanding.  Patient is aware to watch out for worsening redness, increased discharge, development of fever or chills and if any of these things occur she is to call office right away.  Keep regular follow-up with plan as per PCP, return to clinic sooner if current issues persist or worsen.

## 2018-01-16 ENCOUNTER — Encounter: Payer: Self-pay | Admitting: Family Medicine

## 2018-02-11 ENCOUNTER — Ambulatory Visit: Payer: BLUE CROSS/BLUE SHIELD | Admitting: Internal Medicine

## 2018-03-10 ENCOUNTER — Telehealth: Payer: BLUE CROSS/BLUE SHIELD | Admitting: Physician Assistant

## 2018-03-10 DIAGNOSIS — J069 Acute upper respiratory infection, unspecified: Secondary | ICD-10-CM

## 2018-03-10 DIAGNOSIS — B9789 Other viral agents as the cause of diseases classified elsewhere: Secondary | ICD-10-CM

## 2018-03-10 MED ORDER — IPRATROPIUM BROMIDE 0.06 % NA SOLN: 2.0000 | Freq: Four times a day (QID) | NASAL | 0 refills | Status: DC

## 2018-03-10 MED ORDER — BENZONATATE 100 MG PO CAPS: 100.0000 mg | ORAL_CAPSULE | Freq: Three times a day (TID) | ORAL | 0 refills | Status: DC

## 2018-03-10 NOTE — Progress Notes (Signed)
We are sorry you are not feeling well.  Here is how we plan to help!  Based on what you have shared with me, it looks like you may have a viral upper respiratory infection or a "common cold".  Colds are caused by a large number of viruses; however, rhinovirus is the most common cause.   Symptoms of the common cold vary from person to person, with common symptoms including sore throat, cough, and malaise.  A low-grade fever of 100.4 may present, but is often uncommon.  Symptoms vary however, and are closely related to a person's age or underlying illnesses.  The most common symptoms associated with the common cold are nasal discharge or congestion, cough, sneezing, headache and pressure in the ears and face.  Cold symptoms usually persist for about 3 to 10 days, but can last up to 2 weeks.  It is important to know that colds do not cause serious illness or complications in most cases.    The common cold is transmitted from person to person, with the most common method of transmission being a person's hands.  The virus is able to live on the skin and can infect other persons for up to 2 hours after direct contact.  Also, colds are transmitted when someone coughs or sneezes; thus, it is important to cover the mouth to reduce this risk.  To keep the spread of the common cold at [place], good hand hygiene is very important.  This is an infection that is most likely caused by a virus. There are no specific treatments for the common cold other than to help you with the symptoms until the infection runs its course.    For nasal congestion, you may use an oral decongestants such as Mucinex D or if you have glaucoma or high blood pressure use plain Mucinex.  Saline nasal spray or nasal drops can help and can safely be used as often as needed for congestion.  For your congestion, I have prescribed Ipratropium Bromide nasal spray 0.03% two sprays in each nostril 2-3 times a day  If you do not have a history of heart  disease, hypertension, diabetes or thyroid disease, prostate/bladder issues or glaucoma, you may also use Sudafed to treat nasal congestion.  It is highly recommended that you consult with a pharmacist or your primary care physician to ensure this medication is safe for you to take.     If you have a cough, you may use cough suppressants such as Delsym and Robitussin.  If you have glaucoma or high blood pressure, you can also use Coricidin HBP.   For cough I have prescribed for you A prescription cough medication called Tessalon Perles 100 mg. You may take 1-2 capsules every 8 hours as needed for cough  If you have a sore or scratchy throat, use a saltwater gargle-  to  teaspoon of salt dissolved in a 4-ounce to 8-ounce glass of warm water.  Gargle the solution for approximately 15-30 seconds and then spit.  It is important not to swallow the solution.  You can also use throat lozenges/cough drops and Chloraseptic spray to help with throat pain or discomfort.  Warm or cold liquids can also be helpful in relieving throat pain.  For headache, pain or general discomfort, you can use Ibuprofen or Tylenol as directed.   Some authorities believe that zinc sprays or the use of Echinacea may shorten the course of your symptoms.   HOME CARE Only take medications as instructed  by your medical team. Be sure to drink plenty of fluids. Water is fine as well as fruit juices, sodas and electrolyte beverages. You may want to stay away from caffeine or alcohol. If you are nauseated, try taking small sips of liquids. How do you know if you are getting enough fluid? Your urine should be a pale yellow or almost colorless. Get rest. Taking a steamy shower or using a humidifier may help nasal congestion and ease sore throat pain. You can place a towel over your head and breathe in the steam from hot water coming from a faucet. Using a saline nasal spray works much the same way. Cough drops, hard candies and sore throat  lozenges may ease your cough. Avoid close contacts especially the very young and the elderly Cover your mouth if you cough or sneeze Always remember to wash your hands.   GET HELP RIGHT AWAY IF: You develop worsening fever. If your symptoms do not improve within 10 days You develop yellow or green discharge from your nose over 3 days. You have coughing fits You develop a severe head ache or visual changes. You develop shortness of breath, difficulty breathing or start having chest pain  Your symptoms persist after you have completed your treatment plan  MAKE SURE YOU  Understand these instructions. Will watch your condition. Will get help right away if you are not doing well or get worse.  Your e-visit answers were reviewed by a board certified advanced clinical practitioner to complete your personal care plan. Depending upon the condition, your plan could have included both over the counter or prescription medications. Please review your pharmacy choice. If there is a problem, you may call our nursing hot line at and have the prescription routed to another pharmacy. Your safety is important to [place]. If you have drug allergies check your prescription carefully.   You can use MyChart to ask questions about today's visit, request a non-urgent call back, or ask for a work or school excuse for 24 hours related to this e-Visit. If it has been greater than 24 hours you will need to follow up with your provider, or enter a new e-Visit to address those concerns. You will get an e-mail in the next two days asking about your experience.  I hope that your e-visit has been valuable and will speed your recovery. Thank you for using e-visits.      ===View-only below this line===   ----- Message -----    From: Augusto Garbe [place]    Sent: 03/10/2018  9:06 AM EST      To: E-Visit Mailing List Subject: E-Visit Submission: Cough  E-Visit Submission:  Cough --------------------------------  Question: How long have you been coughing? Answer:   4 days  Question: How would you describe the cough? Answer:   A cough that is part of a cold  Question: How often are you coughing? Answer:   Infrequently but steadily  Question: Does the cough prevent you from sleeping at night? Answer:   Yes  Question: What other symptoms have you experienced with the cough? Answer:   Runny nose            Sore throat            Swollen glands  Question: Do you have a fever? Answer:   Yes, I have a low fever (less than 101 degrees)  Question: Describe your sore throat: Answer:   When I cough it hurts.  I have a build  up of junk and when I do cough it up, it is green and has a bad taste. I also have lost my voice.  Question: How long have you had a sore throat? Answer:   3 days  Question: Do you have any tenderness or swelling in your neck? Answer:   Yes  Question: Are you coughing up any mucus? Answer:   I am coughing up a little bit of mucus  Question: Do you use a maintenance inhaler? Answer:   No  Question: Do you use a rescue inhaler (such as Ventolin?) Answer:   No  Question: Have you previously required a prescription for prednisone for cough? Answer:   Yes  Question: Are you diabetic? Answer:   Yes  Question: Are you pregnant? Answer:   I am confident that I am not pregnant  Question: Are you breastfeeding? Answer:   No  Question: What is the appearance of the mucus? Answer:   The mucus is thick            The mucus has changed from thin to thick            The mucus has changed from clear to colored  Question: Do you have any of the following? Answer:   Loss of appetite  Question: Do you smoke? Answer:   No  Question: Have you ever smoked? Answer:   I smoked in the past, but not regularly  Question: Are there people you know with similar symptoms? Answer:   No  Question: Are you experiencing any of the  following? Answer:   Coughing more when lying  Question: Are you having difficulty breathing? Answer:   No  Question: Is your coughing worse when you are exposed to pollen, dust, or other things in the environment? Answer:   No  Question: Have you been treated for a similar cough in the past? Answer:   Yes  Question: What treatments have worked in the past?  Answer:   Prednisone and an inhaler, along with a cough medicine to help me sleep.  Question: What treatment(s) in the past have been unsuccessful? Answer:   Tussin cough pills.  Question: Have you ever been diagnosed with asthma, bronchitis, or lung disease? Answer:   No  Question: Have you recently started on any medications for your heart or for blood pressure? Answer:   No  Question: Have you recently been hospitalized? Answer:   No  Question: Please list your medication allergies that you may have ? (If 'none' , please list as 'none') Answer:   none  Question: Please list any additional comments  Answer:   I am taking Mucinex DM as well as advil for aches.  Also, I have an appointment with Dr Derrel Nip at [facility] in [place] on Friday for a follow up on my underactive thyroid.  I can follow up with her about this cough then but didn't want to wait and let it get worse before then.

## 2018-03-13 ENCOUNTER — Encounter: Payer: Self-pay | Admitting: Internal Medicine

## 2018-03-13 ENCOUNTER — Ambulatory Visit (INDEPENDENT_AMBULATORY_CARE_PROVIDER_SITE_OTHER): Payer: BLUE CROSS/BLUE SHIELD | Admitting: Internal Medicine

## 2018-03-13 VITALS — BP 118/88 | HR 77 | Temp 98.5°F | Resp 15 | Ht 63.0 in | Wt 216.4 lb

## 2018-03-13 DIAGNOSIS — E039 Hypothyroidism, unspecified: Secondary | ICD-10-CM

## 2018-03-13 DIAGNOSIS — J209 Acute bronchitis, unspecified: Secondary | ICD-10-CM

## 2018-03-13 DIAGNOSIS — E119 Type 2 diabetes mellitus without complications: Secondary | ICD-10-CM | POA: Diagnosis not present

## 2018-03-13 DIAGNOSIS — I10 Essential (primary) hypertension: Secondary | ICD-10-CM | POA: Diagnosis not present

## 2018-03-13 MED ORDER — LEVOTHYROXINE SODIUM 50 MCG PO TABS: 50.0000 ug | ORAL_TABLET | Freq: Every day | ORAL | 3 refills | Status: DC

## 2018-03-13 MED ORDER — HYDROCOD POLST-CPM POLST ER 10-8 MG/5ML PO SUER: 5.0000 mL | Freq: Every evening | ORAL | 0 refills | Status: DC | PRN

## 2018-03-13 NOTE — Progress Notes (Signed)
Subjective:  Patient ID: Christina Stafford, female    DOB: 1970/10/05  Age: 47 y.o. MRN: [id number]  CC: The primary encounter diagnosis was Type 2 diabetes mellitus without complication, without long-term current use of insulin ([place]). Diagnoses of Essential hypertension, Acquired hypothyroidism, and Acute bronchitis, unspecified organism were also pertinent to this visit.  HPI Christina Stafford presents for follow up on multiple issues:  1) New onset  Type 2DM: .  Patient is following a low glycemic index diet and taking all prescribed medications regularly without side effects. Patient is exercising about 3 times per week and intentionally trying to lose weight .  Patient has had an eye exam in the last 12 months and checks feet regularly for signs of infection.  Patient does not walk barefoot outside,  And denies an numbness tingling or burning in feet. Patient is up to date on all recommended vaccinations and had her Eye exam done  By Dr Matilde Sprang.   2) Abnormal TSH:  She has had an elevated TSH on two occasions  and notes hair loss and fatigue as symptoms. She is eager to start medication.  3) Cough:  Has been recovering from a viral infection,  On Day 5.  Persistent cough ,keeping her up at night.  Cough is non productive,  No fevers  Or body aches.  No chest pain.   5)  Hypertension: patient checks blood pressure twice weekly at home.  Readings have been for the most part <140/80 at rest . Patient is following a reduce salt diet most days and is taking medications as prescribed  Lab Results  Component Value Date   HGBA1C 6.7 (H) 03/13/2018     Outpatient Medications Prior to Visit  Medication Sig Dispense Refill  . ALPRAZolam (XANAX) 0.5 MG tablet Take 1 tablet (0.5 mg total) by mouth daily as needed for anxiety (or panic attacks). 30 tablet 2  . esomeprazole (NEXIUM) 20 MG capsule Take 20 mg by mouth every morning.     . hydrochlorothiazide (HYDRODIURIL) 25 MG tablet Take 1  tablet (25 mg total) by mouth daily. 90 tablet 1  . ipratropium (ATROVENT) 0.06 % nasal spray Place 2 sprays into both nostrils 4 (four) times daily. 15 mL 0  . metFORMIN (GLUCOPHAGE) 500 MG tablet Take 1 tablet (500 mg total) by mouth daily with breakfast. 90 tablet 1  . metoprolol succinate (TOPROL-XL) 25 MG 24 hr tablet TAKE 1 TABLET(25 MG) BY MOUTH DAILY 90 tablet 1  . sertraline (ZOLOFT) 50 MG tablet TAKE 1 AND 1/2 TABLETS BY MOUTH IN THE EVENING 135 tablet 1  . tetrahydrozoline 0.05 % ophthalmic solution Place 2 drops into both eyes as needed (dry eyes).    . benzonatate (TESSALON) 100 MG capsule Take 1-2 capsules (100-200 mg total) by mouth 3 (three) times daily. (Patient not taking: Reported on 03/13/2018) 30 capsule 0  . cetirizine (ZYRTEC) 10 MG tablet Take 10 mg by mouth daily.     Marland Kitchen sulfamethoxazole-trimethoprim (BACTRIM DS,SEPTRA DS) 800-160 MG tablet Take 1 tablet by mouth 2 (two) times daily. (Patient not taking: Reported on 03/13/2018) 20 tablet 0   No facility-administered medications prior to visit.     Review of Systems;  Patient denies headache, fevers, malaise, unintentional weight loss, skin rash, eye pain, sinus congestion and sinus pain, sore throat, dysphagia,  hemoptysis , cough, dyspnea, wheezing, chest pain, palpitations, orthopnea, edema, abdominal pain, nausea, melena, diarrhea, constipation, flank pain, dysuria, hematuria, urinary  Frequency, nocturia, numbness, tingling,  seizures,  Focal weakness, Loss of consciousness,  Tremor, insomnia, depression, anxiety, and suicidal ideation.      Objective:  BP 118/88 (BP Location: Left Arm, Patient Position: Sitting, Cuff Size: Large)   Pulse 77   Temp 98.5 F (36.9 C) (Oral)   Resp 15   Ht 5' 3"  (1.6 m)   Wt 216 lb 6.4 oz (98.2 kg)   LMP 01/20/2017 (Approximate)   SpO2 97%   BMI 38.33 kg/m   BP Readings from Last 3 Encounters:  03/13/18 118/88  01/15/18 132/84  11/24/17 (!) 144/84    Wt Readings from Last  3 Encounters:  03/13/18 216 lb 6.4 oz (98.2 kg)  01/15/18 217 lb (98.4 kg)  11/24/17 222 lb 9.6 oz (101 kg)    General appearance: alert, cooperative and appears stated age Ears: normal TM's and external ear canals both ears Throat: lips, mucosa, and tongue normal; teeth and gums normal Neck: no adenopathy, no carotid bruit, supple, symmetrical, trachea midline and thyroid not enlarged, symmetric, no tenderness/mass/nodules Back: symmetric, no curvature. ROM normal. No CVA tenderness. Lungs: clear to auscultation bilaterally Heart: regular rate and rhythm, S1, S2 normal, no murmur, click, rub or gallop Abdomen: soft, non-tender; bowel sounds normal; no masses,  no organomegaly Pulses: 2+ and symmetric Skin: Skin color, texture, turgor normal. No rashes or lesions Lymph nodes: Cervical, supraclavicular, and axillary nodes normal.  Lab Results  Component Value Date   HGBA1C 6.7 (H) 03/13/2018   HGBA1C 6.7 (H) 08/29/2017   HGBA1C 5.3 10/12/2014    Lab Results  Component Value Date   CREATININE 0.94 03/13/2018   CREATININE 0.86 12/29/2017   CREATININE 0.79 08/29/2017    Lab Results  Component Value Date   WBC 7.2 08/29/2017   HGB 12.9 08/29/2017   HCT 37.8 08/29/2017   PLT 328.0 08/29/2017   GLUCOSE 106 (H) 03/13/2018   CHOL 368 (H) 12/29/2017   TRIG 303.0 (H) 12/29/2017   HDL 49.30 12/29/2017   LDLDIRECT 289.0 12/29/2017   LDLCALC 230 (H) 06/07/2015   ALT 25 03/13/2018   AST 21 03/13/2018   NA 137 03/13/2018   K 4.0 03/13/2018   CL 98 03/13/2018   CREATININE 0.94 03/13/2018   BUN 12 03/13/2018   CO2 27 03/13/2018   TSH 4.57 (H) 12/29/2017   HGBA1C 6.7 (H) 03/13/2018   MICROALBUR <0.7 11/24/2017    No results found.  Assessment & Plan:   Problem List Items Addressed This Visit    Bronchitis, acute    Recommend use of benadryl to PND and tussionex for persistent cough      DM2 (diabetes mellitus, type 2) ([place]) - Primary    Currently well-controlled on  current medications .  hemoglobin A1c is at goal of less than 7.0 . Patient is reminded to schedule an annual eye exam and foot exam is normal today. Patient has no microalbuminuria.   Lab Results  Component Value Date   HGBA1C 6.7 (H) 03/13/2018   Lab Results  Component Value Date   MICROALBUR <0.7 11/24/2017        Relevant Orders   Hemoglobin A1c (Completed)   Comprehensive metabolic panel (Completed)   Hypertension    Well controlled on current regimen. Renal function stable, no changes today.  Lab Results  Component Value Date   CREATININE 0.94 03/13/2018   Lab Results  Component Value Date   NA 137 03/13/2018   K 4.0 03/13/2018   CL 98 03/13/2018   CO2 27 03/13/2018  Hypothyroidism    Starting levothyroxine 50 mcg daily  In the am prior  to eating/meds.    Lab Results  Component Value Date   TSH 4.57 (H) 12/29/2017         Relevant Medications   levothyroxine (SYNTHROID, LEVOTHROID) 50 MCG tablet      I have discontinued Christina Stafford's cetirizine, sulfamethoxazole-trimethoprim, and benzonatate. I am also having her start on levothyroxine. Additionally, I am having her maintain her esomeprazole, ALPRAZolam, tetrahydrozoline, metFORMIN, sertraline, hydrochlorothiazide, metoprolol succinate, ipratropium, and chlorpheniramine-HYDROcodone.  Meds ordered this encounter  Medications  . DISCONTD: chlorpheniramine-HYDROcodone (TUSSIONEX PENNKINETIC ER) 10-8 MG/5ML SUER    Sig: Take 5 mLs by mouth at bedtime as needed for cough.    Dispense:  140 mL    Refill:  0  . levothyroxine (SYNTHROID, LEVOTHROID) 50 MCG tablet    Sig: Take 1 tablet (50 mcg total) by mouth daily.    Dispense:  90 tablet    Refill:  3  . chlorpheniramine-HYDROcodone (TUSSIONEX PENNKINETIC ER) 10-8 MG/5ML SUER    Sig: Take 5 mLs by mouth at bedtime as needed for cough.    Dispense:  140 mL    Refill:  0    Medications Discontinued During This Encounter  Medication  Reason  . benzonatate (TESSALON) 100 MG capsule Completed Course  . cetirizine (ZYRTEC) 10 MG tablet Patient has not taken in last 30 days  . sulfamethoxazole-trimethoprim (BACTRIM DS,SEPTRA DS) 800-160 MG tablet Completed Course  . chlorpheniramine-HYDROcodone (TUSSIONEX PENNKINETIC ER) 10-8 MG/5ML SUER     Follow-up: Return for follow up diabetes.   Crecencio Mc, MD

## 2018-03-13 NOTE — Patient Instructions (Addendum)
Congratulations on the weight loss during the holidays!   I am starting you on thyroid medication.  we can recheck your thyroid level in 6 weeks ( if you are not feeling better)  Or   In 3 months (with your next diabetes follow up)  .   For the nighttime cough:   benadryl 25 mg  30 minutes prior to  bedtime  For the PND  For the really strong cough:  Tussionex at bedtime  (TUSSIONEX HAS LIQUID VICODIN IN IT SO D NOT COMBINE WITH BENADRYL OR ALCOHOL)   Diabetes Mellitus and Standards of Medical Care Managing diabetes (diabetes mellitus) can be complicated. Your diabetes treatment may be managed by a team of health care providers, including:  A physician who specializes in diabetes (endocrinologist).  A nurse practitioner or physician assistant.  Nurses.  A diet and nutrition specialist (registered dietitian).  A certified diabetes educator (CDE).  An exercise specialist.  A pharmacist.  An eye doctor.  A foot specialist (podiatrist).  A dentist.  A primary care provider.  A mental health provider. Your health care providers follow guidelines to help you get the best quality of care. The following schedule is a general guideline for your diabetes management plan. Your health care providers may give you more specific instructions. Physical exams Upon being diagnosed with diabetes mellitus, and each year after that, your health care provider will ask about your medical and family history. He or she will also do a physical exam. Your exam may include:  Measuring your height, weight, and body mass index (BMI).  Checking your blood pressure. This will be done at every routine medical visit. Your target blood pressure may vary depending on your medical conditions, your age, and other factors.  Thyroid gland exam.  Skin exam.  Screening for damage to your nerves (peripheral neuropathy). This may include checking the pulse in your legs and feet and checking the level of  sensation in your hands and feet.  A complete foot exam to inspect the structure and skin of your feet, including checking for cuts, bruises, redness, blisters, sores, or other problems.  Screening for blood vessel (vascular) problems, which may include checking the pulse in your legs and feet and checking your temperature. Blood tests Depending on your treatment plan and your personal needs, you may have the following tests done:  HbA1c (hemoglobin A1c). This test provides information about blood sugar (glucose) control over the previous 2-3 months. It is used to adjust your treatment plan, if needed. This test will be done: ? At least 2 times a year, if you are meeting your treatment goals. ? 4 times a year, if you are not meeting your treatment goals or if treatment goals have changed.  Lipid testing, including total, LDL, and HDL cholesterol and triglyceride levels. ? The goal for LDL is less than 100 mg/dL (5.5 mmol/L). If you are at high risk for complications, the goal is less than 70 mg/dL (3.9 mmol/L). ? The goal for HDL is 40 mg/dL (2.2 mmol/L) or higher for men and 50 mg/dL (2.8 mmol/L) or higher for women. An HDL cholesterol of 60 mg/dL (3.3 mmol/L) or higher gives some protection against heart disease. ? The goal for triglycerides is less than 150 mg/dL (8.3 mmol/L).  Liver function tests.  Kidney function tests.  Thyroid function tests. Dental and eye exams  Visit your dentist two times a year.  If you have type 1 diabetes, your health care provider may recommend  an eye exam 3-5 years after you are diagnosed, and then once a year after your first exam. ? For children with type 1 diabetes, a health care provider may recommend an eye exam when your child is age 65 or older and has had diabetes for 3-5 years. After the first exam, your child should get an eye exam once a year.  If you have type 2 diabetes, your health care provider may recommend an eye exam as soon as you are  diagnosed, and then once a year after your first exam. Immunizations   The yearly flu (influenza) vaccine is recommended for everyone 6 months or older who has diabetes.  The pneumonia (pneumococcal) vaccine is recommended for everyone 2 years or older who has diabetes. If you are 71 or older, you may get the pneumonia vaccine as a series of two separate shots.  The hepatitis B vaccine is recommended for adults shortly after being diagnosed with diabetes.  Adults and children with diabetes should receive all other vaccines according to age-specific recommendations from the Centers for Disease Control and Prevention (CDC). Mental and emotional health Screening for symptoms of eating disorders, anxiety, and depression is recommended at the time of diagnosis and afterward as needed. If your screening shows that you have symptoms (positive screening result), you may need more evaluation and you may work with a mental health care provider. Treatment plan Your treatment plan will be reviewed at every medical visit. You and your health care provider will discuss:  How you are taking your medicines, including insulin.  Any side effects you are experiencing.  Your blood glucose target goals.  The frequency of your blood glucose monitoring.  Lifestyle habits, such as activity level as well as tobacco, alcohol, and substance use. Diabetes self-management education Your health care provider will assess how well you are monitoring your blood glucose levels and whether you are taking your insulin correctly. He or she may refer you to:  A certified diabetes educator to manage your diabetes throughout your life, starting at diagnosis.  A registered dietitian who can create or review your personal nutrition plan.  An exercise specialist who can discuss your activity level and exercise plan. Summary  Managing diabetes (diabetes mellitus) can be complicated. Your diabetes treatment may be managed by  a team of health care providers.  Your health care providers follow guidelines in order to help you get the best quality of care.  Standards of care including having regular physical exams, blood tests, blood pressure monitoring, immunizations, screening tests, and education about how to manage your diabetes.  Your health care providers may also give you more specific instructions based on your individual health. This information is not intended to replace advice given to you by your health care provider. Make sure you discuss any questions you have with your health care provider. Document Released: 12/30/2008 Document Revised: 11/21/2017 Document Reviewed: 12/01/2015 Elsevier Interactive Patient Education  2019 [employer].

## 2018-03-14 ENCOUNTER — Encounter: Payer: Self-pay | Admitting: Internal Medicine

## 2018-03-14 DIAGNOSIS — E039 Hypothyroidism, unspecified: Secondary | ICD-10-CM | POA: Insufficient documentation

## 2018-03-14 DIAGNOSIS — J209 Acute bronchitis, unspecified: Secondary | ICD-10-CM | POA: Insufficient documentation

## 2018-03-14 LAB — COMPREHENSIVE METABOLIC PANEL
AG RATIO: 1.5 (calc) (ref 1.0–2.5)
ALBUMIN MSPROF: 4.4 g/dL (ref 3.6–5.1)
ALT: 25 U/L (ref 6–29)
AST: 21 U/L (ref 10–35)
Alkaline phosphatase (APISO): 100 U/L (ref 33–115)
BUN: 12 mg/dL (ref 7–25)
CALCIUM: 10.4 mg/dL — AB (ref 8.6–10.2)
CO2: 27 mmol/L (ref 20–32)
Chloride: 98 mmol/L (ref 98–110)
Creat: 0.94 mg/dL (ref 0.50–1.10)
GLUCOSE: 106 mg/dL — AB (ref 65–99)
Globulin: 3 g/dL (calc) (ref 1.9–3.7)
POTASSIUM: 4 mmol/L (ref 3.5–5.3)
SODIUM: 137 mmol/L (ref 135–146)
TOTAL PROTEIN: 7.4 g/dL (ref 6.1–8.1)
Total Bilirubin: 0.3 mg/dL (ref 0.2–1.2)

## 2018-03-14 LAB — HEMOGLOBIN A1C
Hgb A1c MFr Bld: 6.7 % of total Hgb — ABNORMAL HIGH (ref <5.7)
Mean Plasma Glucose: 146 (calc)
eAG (mmol/L): 8.1 (calc)

## 2018-03-14 NOTE — Assessment & Plan Note (Signed)
Well controlled on current regimen. Renal function stable, no changes today.  Lab Results  Component Value Date   CREATININE 0.94 03/13/2018   Lab Results  Component Value Date   NA 137 03/13/2018   K 4.0 03/13/2018   CL 98 03/13/2018   CO2 27 03/13/2018

## 2018-03-14 NOTE — Assessment & Plan Note (Signed)
Starting levothyroxine 50 mcg daily  In the am prior  to eating/meds.    Lab Results  Component Value Date   TSH 4.57 (H) 12/29/2017

## 2018-03-14 NOTE — Assessment & Plan Note (Signed)
Recommend use of benadryl to PND and tussionex for persistent cough

## 2018-03-14 NOTE — Assessment & Plan Note (Addendum)
Currently well-controlled on current medications .  hemoglobin A1c is at goal of less than 7.0 . Patient is reminded to schedule an annual eye exam and foot exam is normal today. Patient has no microalbuminuria.   Lab Results  Component Value Date   HGBA1C 6.7 (H) 03/13/2018   Lab Results  Component Value Date   MICROALBUR <0.7 11/24/2017

## 2018-03-15 ENCOUNTER — Other Ambulatory Visit: Payer: Self-pay | Admitting: Internal Medicine

## 2018-03-15 DIAGNOSIS — E119 Type 2 diabetes mellitus without complications: Secondary | ICD-10-CM

## 2018-03-15 MED ORDER — METFORMIN HCL 500 MG PO TABS: 500.0000 mg | ORAL_TABLET | Freq: Two times a day (BID) | ORAL | 1 refills | Status: DC

## 2018-03-15 NOTE — Assessment & Plan Note (Signed)
Addendum.. wil increase metformin to twice daily for goal a1c < 6.5

## 2018-03-23 MED ORDER — ALPRAZOLAM 0.5 MG PO TABS: 0.5000 mg | ORAL_TABLET | Freq: Every day | ORAL | 2 refills | Status: DC | PRN

## 2018-05-25 ENCOUNTER — Ambulatory Visit (INDEPENDENT_AMBULATORY_CARE_PROVIDER_SITE_OTHER): Payer: BLUE CROSS/BLUE SHIELD | Admitting: Internal Medicine

## 2018-05-25 ENCOUNTER — Encounter: Payer: Self-pay | Admitting: Internal Medicine

## 2018-05-25 VITALS — BP 130/100 | HR 70 | Temp 97.7°F | Resp 15 | Ht 63.0 in | Wt 222.0 lb

## 2018-05-25 DIAGNOSIS — F411 Generalized anxiety disorder: Secondary | ICD-10-CM | POA: Diagnosis not present

## 2018-05-25 DIAGNOSIS — E782 Mixed hyperlipidemia: Secondary | ICD-10-CM

## 2018-05-25 DIAGNOSIS — I10 Essential (primary) hypertension: Secondary | ICD-10-CM

## 2018-05-25 DIAGNOSIS — F410 Panic disorder [episodic paroxysmal anxiety] without agoraphobia: Secondary | ICD-10-CM

## 2018-05-25 DIAGNOSIS — E034 Atrophy of thyroid (acquired): Secondary | ICD-10-CM

## 2018-05-25 DIAGNOSIS — E119 Type 2 diabetes mellitus without complications: Secondary | ICD-10-CM

## 2018-05-25 DIAGNOSIS — E1100 Type 2 diabetes mellitus with hyperosmolarity without nonketotic hyperglycemic-hyperosmolar coma (NKHHC): Secondary | ICD-10-CM

## 2018-05-25 NOTE — Progress Notes (Signed)
Subjective:  Patient ID: Christina Stafford, female    DOB: 1971-01-06  Age: 49 y.o. MRN: [id number]  CC: The primary encounter diagnosis was Mixed hyperlipidemia. Diagnoses of Hypothyroidism due to acquired atrophy of thyroid, Diabetes mellitus with no complication ([place]), Generalized anxiety disorder with panic attacks, Type 2 diabetes mellitus with hyperosmolarity without coma, without long-term current use of insulin ([place]), and Essential hypertension were also pertinent to this visit.  HPI Christina Stafford presents for follow up on GAD and type 2 DM   Patient has no complaints today.  Still having panic attacks and hyperventilates.  Treats with xanax prn and still taking zoloft.  The panic attacks occur mostly  on Mondays. Had a great weekend with daughter, going to yoga.  But dreads going to work on Mondays . Works for a [employer] in [employer]    Type 2 DM: Patient is following a low glycemic index diet and taking all prescribed medications regularly without side effects.  Fasting sugars have been under less than 140 most of the time and post prandials have been under 160 except on rare occasions. Patient is exercising about 3 times per week and intentionally trying to lose weight .  Patient has had an eye exam in the last 12 months and checks feet regularly for signs of infection.  Patient does not walk barefoot outside,  And denies an numbness tingling or burning in feet. Patient is up to date on all recommended vaccinations  Outpatient Medications Prior to Visit  Medication Sig Dispense Refill  . ALPRAZolam (XANAX) 0.5 MG tablet Take 1 tablet (0.5 mg total) by mouth daily as needed for anxiety (or panic attacks). 30 tablet 2  . esomeprazole (NEXIUM) 20 MG capsule Take 20 mg by mouth every other day.     . hydrochlorothiazide (HYDRODIURIL) 25 MG tablet Take 1 tablet (25 mg total) by mouth daily. 90 tablet 1  . levothyroxine (SYNTHROID, LEVOTHROID) 50 MCG tablet Take 1 tablet (50 mcg total)  by mouth daily. 90 tablet 3  . metFORMIN (GLUCOPHAGE) 500 MG tablet Take 1 tablet (500 mg total) by mouth 2 (two) times daily with a meal. 180 tablet 1  . metoprolol succinate (TOPROL-XL) 25 MG 24 hr tablet TAKE 1 TABLET(25 MG) BY MOUTH DAILY 90 tablet 1  . sertraline (ZOLOFT) 50 MG tablet TAKE 1 AND 1/2 TABLETS BY MOUTH IN THE EVENING 135 tablet 1  . tetrahydrozoline 0.05 % ophthalmic solution Place 2 drops into both eyes as needed (dry eyes).    . chlorpheniramine-HYDROcodone (TUSSIONEX PENNKINETIC ER) 10-8 MG/5ML SUER Take 5 mLs by mouth at bedtime as needed for cough. (Patient not taking: Reported on 05/25/2018) 140 mL 0  . ipratropium (ATROVENT) 0.06 % nasal spray Place 2 sprays into both nostrils 4 (four) times daily. (Patient not taking: Reported on 05/25/2018) 15 mL 0   No facility-administered medications prior to visit.     Review of Systems;  Patient denies headache, fevers, malaise, unintentional weight loss, skin rash, eye pain, sinus congestion and sinus pain, sore throat, dysphagia,  hemoptysis , cough, dyspnea, wheezing, chest pain, palpitations, orthopnea, edema, abdominal pain, nausea, melena, diarrhea, constipation, flank pain, dysuria, hematuria, urinary  Frequency, nocturia, numbness, tingling, seizures,  Focal weakness, Loss of consciousness,  Tremor, insomnia, depression,  and suicidal ideation.      Objective:  BP (!) 130/100 (BP Location: Left Arm, Patient Position: Sitting, Cuff Size: Large)   Pulse 70   Temp 97.7 F (36.5 C) (Oral)  Resp 15   Ht 5' 3"  (1.6 m)   Wt 222 lb (100.7 kg)   LMP 01/20/2017 (Approximate)   SpO2 97%   BMI 39.33 kg/m   BP Readings from Last 3 Encounters:  05/25/18 (!) 130/100  03/13/18 118/88  01/15/18 132/84    Wt Readings from Last 3 Encounters:  05/25/18 222 lb (100.7 kg)  03/13/18 216 lb 6.4 oz (98.2 kg)  01/15/18 217 lb (98.4 kg)    General appearance: alert, cooperative and appears stated age Ears: normal TM's and  external ear canals both ears Throat: lips, mucosa, and tongue normal; teeth and gums normal Neck: no adenopathy, no carotid bruit, supple, symmetrical, trachea midline and thyroid not enlarged, symmetric, no tenderness/mass/nodules Back: symmetric, no curvature. ROM normal. No CVA tenderness. Lungs: clear to auscultation bilaterally Heart: regular rate and rhythm, S1, S2 normal, no murmur, click, rub or gallop Abdomen: soft, non-tender; bowel sounds normal; no masses,  no organomegaly Pulses: 2+ and symmetric Psych: affect  Tearful , makes good eye contact. No fidgeting,  Smiles easily.  Denies suicidal thoughts   Lab Results  Component Value Date   HGBA1C 6.7 (H) 03/13/2018   HGBA1C 6.7 (H) 08/29/2017   HGBA1C 5.3 10/12/2014    Lab Results  Component Value Date   CREATININE 0.94 03/13/2018   CREATININE 0.86 12/29/2017   CREATININE 0.79 08/29/2017    Lab Results  Component Value Date   WBC 7.2 08/29/2017   HGB 12.9 08/29/2017   HCT 37.8 08/29/2017   PLT 328.0 08/29/2017   GLUCOSE 106 (H) 03/13/2018   CHOL 368 (H) 12/29/2017   TRIG 303.0 (H) 12/29/2017   HDL 49.30 12/29/2017   LDLDIRECT 289.0 12/29/2017   LDLCALC 230 (H) 06/07/2015   ALT 25 03/13/2018   AST 21 03/13/2018   NA 137 03/13/2018   K 4.0 03/13/2018   CL 98 03/13/2018   CREATININE 0.94 03/13/2018   BUN 12 03/13/2018   CO2 27 03/13/2018   TSH 4.57 (H) 12/29/2017   HGBA1C 6.7 (H) 03/13/2018   MICROALBUR <0.7 11/24/2017    No results found.  Assessment & Plan:   Problem List Items Addressed This Visit    Hyperlipidemia - Primary   Relevant Orders   Comprehensive metabolic panel   Hypertension    she reports compliance with medication regimen  but has an elevated reading today in office.  She is not using NSAIDs daily.  Discussed goal of 120/70  (130/80 for patients over 70)  to preserve renal function.  She has been asked to check her  BP  at home and  submit readings for evaluation. Renal function,  electrolytes and screen for proteinuria are all normal .      Generalized anxiety disorder with panic attacks    Continue Zoloft daily for management of anxiety manifested in somatic complaints.  She has identified the source of her anxiety as being concern about her inability to perform well at her new job and has been considering confronting her supervisor       DM2 (diabetes mellitus, type 2) ([place])    Continue metformin  twice daily .  Lab Results  Component Value Date   HGBA1C 6.7 (H) 03/13/2018   Lab Results  Component Value Date   MICROALBUR <0.7 11/24/2017          Hypothyroidism    Started levothyroxine 50 mcg daily  In the am prior  to eating/meds.   Repeat TSH Is due   Lab Results  Component Value Date   TSH 4.57 (H) 12/29/2017         Relevant Orders   TSH    Other Visit Diagnoses    Diabetes mellitus with no complication ([place])       Relevant Orders   Hemoglobin A1c   Lipid panel   Microalbumin / creatinine urine ratio      I have discontinued Argelia F. Ching's ipratropium and chlorpheniramine-HYDROcodone. I am also having her maintain her esomeprazole, tetrahydrozoline, sertraline, hydrochlorothiazide, metoprolol succinate, levothyroxine, metFORMIN, and ALPRAZolam.  No orders of the defined types were placed in this encounter.   Medications Discontinued During This Encounter  Medication Reason  . chlorpheniramine-HYDROcodone (TUSSIONEX PENNKINETIC ER) 10-8 MG/5ML SUER Completed Course  . ipratropium (ATROVENT) 0.06 % nasal spray Patient has not taken in last 30 days    Follow-up: Return in about 6 months (around 11/25/2018) for follow up diabetes.   Crecencio Mc, MD

## 2018-05-25 NOTE — Patient Instructions (Signed)
  The goal for optimal blood pressure is 120/70.  Please check your blood pressure a few times at home (or at your pharmacy and send me the readings so I can determine if you need a change in medication   Return for fasting labs after March 27   I'll see you in 6 months,  Sooner if you need me!

## 2018-05-26 NOTE — Assessment & Plan Note (Signed)
Continue Zoloft daily for management of anxiety manifested in somatic complaints.  She has identified the source of her anxiety as being concern about her inability to perform well at her new job and has been considering confronting her supervisor

## 2018-05-26 NOTE — Assessment & Plan Note (Signed)
Continue metformin  twice daily .  Lab Results  Component Value Date   HGBA1C 6.7 (H) 03/13/2018   Lab Results  Component Value Date   MICROALBUR <0.7 11/24/2017

## 2018-05-26 NOTE — Assessment & Plan Note (Signed)
Started levothyroxine 50 mcg daily  In the am prior  to eating/meds.   Repeat TSH Is due   Lab Results  Component Value Date   TSH 4.57 (H) 12/29/2017

## 2018-05-26 NOTE — Assessment & Plan Note (Addendum)
she reports compliance with medication regimen  but has an elevated reading today in office.  She is not using NSAIDs daily.  Discussed goal of 120/70  (130/80 for patients over 70)  to preserve renal function.  She has been asked to check her  BP  at home and  submit readings for evaluation. Renal function, electrolytes and screen for proteinuria are all normal .

## 2018-06-12 ENCOUNTER — Ambulatory Visit: Payer: BLUE CROSS/BLUE SHIELD | Admitting: Internal Medicine

## 2018-06-16 ENCOUNTER — Other Ambulatory Visit: Payer: Self-pay | Admitting: Internal Medicine

## 2018-06-16 DIAGNOSIS — R600 Localized edema: Secondary | ICD-10-CM

## 2018-06-16 DIAGNOSIS — I10 Essential (primary) hypertension: Secondary | ICD-10-CM

## 2018-06-17 ENCOUNTER — Other Ambulatory Visit: Payer: Self-pay

## 2018-06-17 ENCOUNTER — Other Ambulatory Visit (INDEPENDENT_AMBULATORY_CARE_PROVIDER_SITE_OTHER): Payer: BLUE CROSS/BLUE SHIELD

## 2018-06-17 DIAGNOSIS — E119 Type 2 diabetes mellitus without complications: Secondary | ICD-10-CM

## 2018-06-17 DIAGNOSIS — E034 Atrophy of thyroid (acquired): Secondary | ICD-10-CM | POA: Diagnosis not present

## 2018-06-17 DIAGNOSIS — E782 Mixed hyperlipidemia: Secondary | ICD-10-CM

## 2018-06-17 LAB — COMPREHENSIVE METABOLIC PANEL
ALT: 39 U/L — ABNORMAL HIGH (ref 0–35)
AST: 23 U/L (ref 0–37)
Albumin: 4.7 g/dL (ref 3.5–5.2)
Alkaline Phosphatase: 88 U/L (ref 39–117)
BUN: 16 mg/dL (ref 6–23)
CO2: 25 mEq/L (ref 19–32)
Calcium: 10.2 mg/dL (ref 8.4–10.5)
Chloride: 96 mEq/L (ref 96–112)
Creatinine, Ser: 0.82 mg/dL (ref 0.40–1.20)
GFR: 73.83 mL/min (ref >60.00)
Glucose, Bld: 130 mg/dL — ABNORMAL HIGH (ref 70–99)
Potassium: 4.4 mEq/L (ref 3.5–5.1)
Sodium: 136 mEq/L (ref 135–145)
Total Bilirubin: 0.4 mg/dL (ref 0.2–1.2)
Total Protein: 7.4 g/dL (ref 6.0–8.3)

## 2018-06-17 LAB — LDL CHOLESTEROL, DIRECT: Direct LDL: 283 mg/dL

## 2018-06-17 LAB — HEMOGLOBIN A1C: Hgb A1c MFr Bld: 6.7 % — ABNORMAL HIGH (ref 4.6–6.5)

## 2018-06-17 LAB — LIPID PANEL
Cholesterol: 428 mg/dL — ABNORMAL HIGH (ref 0–200)
HDL: 48.8 mg/dL (ref >39.00)
Total CHOL/HDL Ratio: 9
Triglycerides: 422 mg/dL — ABNORMAL HIGH (ref 0.0–149.0)

## 2018-06-17 LAB — TSH: TSH: 2.7 u[IU]/mL (ref 0.35–4.50)

## 2018-06-17 LAB — MICROALBUMIN / CREATININE URINE RATIO
Creatinine,U: 119 mg/dL
Microalb Creat Ratio: 3.5 mg/g (ref 0.0–30.0)
Microalb, Ur: 4.2 mg/dL — ABNORMAL HIGH (ref 0.0–1.9)

## 2018-06-27 ENCOUNTER — Other Ambulatory Visit: Payer: Self-pay | Admitting: Internal Medicine

## 2018-06-29 ENCOUNTER — Ambulatory Visit (INDEPENDENT_AMBULATORY_CARE_PROVIDER_SITE_OTHER): Payer: BLUE CROSS/BLUE SHIELD | Admitting: Internal Medicine

## 2018-06-29 DIAGNOSIS — E782 Mixed hyperlipidemia: Secondary | ICD-10-CM | POA: Diagnosis not present

## 2018-06-29 DIAGNOSIS — I10 Essential (primary) hypertension: Secondary | ICD-10-CM

## 2018-06-29 DIAGNOSIS — E1169 Type 2 diabetes mellitus with other specified complication: Secondary | ICD-10-CM | POA: Diagnosis not present

## 2018-06-29 DIAGNOSIS — F411 Generalized anxiety disorder: Secondary | ICD-10-CM

## 2018-06-29 DIAGNOSIS — F410 Panic disorder [episodic paroxysmal anxiety] without agoraphobia: Secondary | ICD-10-CM

## 2018-06-29 DIAGNOSIS — Z1239 Encounter for other screening for malignant neoplasm of breast: Secondary | ICD-10-CM | POA: Diagnosis not present

## 2018-06-29 DIAGNOSIS — E039 Hypothyroidism, unspecified: Secondary | ICD-10-CM | POA: Diagnosis not present

## 2018-06-29 MED ORDER — ROSUVASTATIN CALCIUM 10 MG PO TABS: 10.0000 mg | ORAL_TABLET | Freq: Every day | ORAL | 1 refills | Status: DC

## 2018-06-29 MED ORDER — LEVOTHYROXINE SODIUM 75 MCG PO TABS: 75.0000 ug | ORAL_TABLET | Freq: Every day | ORAL | 1 refills | Status: DC

## 2018-06-29 NOTE — Patient Instructions (Signed)
I AM PRESCRIBING  Generic Crestor 10 mg daily in the evening  Levothyroxine dose (increased to 75 mcg daily in the morning)   Please make a fasting lab appt in about 6 weeks

## 2018-06-29 NOTE — Progress Notes (Signed)
Virtual Visit [email or web address]  This visit type was conducted due to national recommendations for restrictions regarding the COVID-19 pandemic (e.g. social distancing).  This format is felt to be most appropriate for this patient at this time.  All issues noted in this document were discussed and addressed.  No physical exam was performed (except for noted visual exam findings with Video Visits).   I connected with@ on 06/29/18 at  3:00 PM EDT by a video enabled telemedicine application or telephone and verified that I am speaking with the correct person using two identifiers. Location patient: home Location provider: work or home office Persons participating in the virtual visit: patient, provider  I discussed the limitations, risks, security and privacy concerns of performing an evaluation and management service by telephone and the availability of in person appointments. I also discussed with the patient that there may be a patient responsible charge related to this service. The patient expressed understanding and agreed to proceed.  Reason for visit: follow up on GAD , htn and T2DM  HPI: .  3 month follow up on type 2 DM, hyperlipidemia,  Hypothyroidism and hypertension  Patient is taking her medications as prescribed and notes no adverse effects.  Home BP readings have not  been .   She is avoiding added salt in her diet and walking regularly about 3 times per week for exercise  .  Reviewed recent fasting labs:  She has very elevated trigs and LDL has not taken a statin in years due to preference. No history of intolerance.     follow up on diet controlled  diabetes.  Patient has no complaints today.  Patient is following a low glycemic index diet  75% of the time . Fasting sugars have been under less than 140 most of the time and post prandials have not been checked unless she has symptoms of low blood sugar. Patient is not exercising  Regularly  Or intentionally trying to lose weight .  Patient  has had an eye exam in the last 12 months and checks feet regularly for signs of infection.  Patient does not walk barefoot outside,  And denies an numbness tingling or burning in feet. Patient is up to date on all recommended vaccinations  Needs mammogram done in [place]. Has not had one in several years,,    after  1 :00 pm   ROS: See pertinent positives and negatives per HPI.  Past Medical History:  Diagnosis Date  . Carpal tunnel syndrome on both sides   . Complication of anesthesia   . Depression   . Family history of adverse reaction to anesthesia    N/V  . GERD (gastroesophageal reflux disease)   . Heart murmur    DURING PREGNANCY  . Hyperlipidemia   . Hypertension   . Obesity   . PONV (postoperative nausea and vomiting)   . Sleep apnea 2011   using CPAP 6 cm H20  [facility] Sleep Study  . Vertigo     Past Surgical History:  Procedure Laterality Date  . BREAST SURGERY  2003   negative biopsy  . CHOLECYSTECTOMY  1995  . HYSTERECTOMY ABDOMINAL WITH SALPINGECTOMY    . LAPAROSCOPIC VAGINAL HYSTERECTOMY WITH SALPINGO OOPHORECTOMY Bilateral 04/28/2017   Procedure: LAPAROSCOPIC ASSISTED VAGINAL HYSTERECTOMY WITH BILATERAL SALPINGO OOPHORECTOMY with Cystoscopy;  Surgeon: Brayton Mars, MD;  Location: [facility] ORS;  Service: Gynecology;  Laterality: Bilateral;  . OVARIAN CYST REMOVAL    . TONSILLECTOMY AND ADENOIDECTOMY  2003  . TUBAL  LIGATION    . UPPER EXTREMITY ANGIOGRAPHY Left 05/19/2017   Procedure: UPPER EXTREMITY ANGIOGRAPHY;  Surgeon: Algernon Huxley, MD;  Location: [facility];  Service: Cardiovascular;  Laterality: Left;    Family History  Problem Relation Age of Onset  . Hypertension Mother   . Diabetes Mother   . Uterine cancer Mother   . Hyperlipidemia Father   . Breast cancer Neg Hx   . Ovarian cancer Neg Hx   . 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  ALPRAZolam (XANAX) 0.5 MG tablet,  Take 1 tablet (0.5 mg total) by mouth daily as needed for anxiety (or panic attacks)., Disp: 30 tablet, Rfl: 2 .  esomeprazole (NEXIUM) 20 MG capsule, Take 20 mg by mouth every other day. , Disp: , Rfl:  .  hydrochlorothiazide (HYDRODIURIL) 25 MG tablet, TAKE 1 TABLET BY MOUTH EVERY DAY, DISCONTINUE NORVASC 2.5, Disp: 90 tablet, Rfl: 1 .  levothyroxine (SYNTHROID, LEVOTHROID) 75 MCG tablet, Take 1 tablet (75 mcg total) by mouth daily., Disp: 90 tablet, Rfl: 1 .  metFORMIN (GLUCOPHAGE) 500 MG tablet, Take 1 tablet (500 mg total) by mouth 2 (two) times daily with a meal., Disp: 180 tablet, Rfl: 1 .  metoprolol succinate (TOPROL-XL) 25 MG 24 hr tablet, TAKE 1 TABLET(25 MG) BY MOUTH DAILY, Disp: 90 tablet, Rfl: 1 .  sertraline (ZOLOFT) 50 MG tablet, TAKE 1 AND 1/2 TABLETS BY MOUTH IN THE EVENING, Disp: 135 tablet, Rfl: 1 .  tetrahydrozoline 0.05 % ophthalmic solution, Place 2 drops into both eyes as needed (dry eyes)., Disp: , Rfl:  .  rosuvastatin (CRESTOR) 10 MG tablet, Take 1 tablet (10 mg total) by mouth daily., Disp: 90 tablet, Rfl: 1  EXAM:  VITALS per patient if applicable:  GENERAL: alert, oriented, appears well and in no acute distress  HEENT: atraumatic, conjunttiva clear, no obvious abnormalities on inspection of external nose and ears  NECK: normal movements of the head and neck  LUNGS: on inspection no signs of respiratory distress, breathing rate appears normal, no obvious gross SOB, gasping or wheezing  CV: no obvious cyanosis  MS: moves all visible extremities without noticeable abnormality  PSYCH/NEURO: pleasant and cooperative, no obvious depression or anxiety, speech and thought processing grossly intact  ASSESSMENT AND PLAN:  Discussed the following assessment and plan:  Breast cancer screening - Plan: MM 3D SCREEN BREAST BILATERAL, CANCELED: MM 3D SCREEN BREAST BILATERAL  Mixed hyperlipidemia - Plan: Comprehensive metabolic panel, Lipid panel  Acquired  hypothyroidism - Plan: TSH  DM type 2 with diabetic mixed hyperlipidemia (HCC)  Essential hypertension  Generalized anxiety disorder  Generalized anxiety disorder with panic attacks  DM type 2 with diabetic mixed hyperlipidemia ([place]) Starting Crestor for management of LDL and triglycerides. Repeat labs in 6 weeks  s Lab Results  Component Value Date   CHOL 428 (H) 06/17/2018   HDL 48.80 06/17/2018   LDLCALC 230 (H) 06/07/2015   LDLDIRECT 283.0 06/17/2018   TRIG (H) 06/17/2018    422.0 Triglyceride is over 400; calculations on Lipids are invalid.   CHOLHDL 9 06/17/2018     Hypertension Currently managed with metoprolol  and hct. Given new onset microalbuminuria, will recommend use of ARB at next visit  Lab Results  Component Value Date   MICROALBUR 4.2 (H) 06/17/2018     Generalized anxiety disorder Continue Zoloft daily for management of anxiety manifested in somatic complaints.  Encouraging daily walk.   Generalized anxiety disorder with panic attacks Continue Zoloft daily for management of anxiety manifested in somatic complaints and prn use of alprazolam for panic attacks. The risks and benefits of benzodiazepine use were discussed with patient today including excessive sedation leading to respiratory depression,  impaired thinking/driving, and addiction.  Patient was advised to avoid concurrent use with alcohol, to use medication only as needed and not to share with others  .   Hypothyroidism Trial of increased levothyroxine dose to 75 mcg, to help with weight gain  Recheck tsh in 6 weeks,   Obesity, diabetes, and hypertension syndrome ([place]) She is at goal on current meds. Continue metformin  twice daily . will discuss medication change from beta blocker to ARB at next visit when BP can be assessed.   Lab Results  Component Value Date   HGBA1C 6.7 (H) 06/17/2018   Lab Results  Component Value Date   MICROALBUR 4.2 (H) 06/17/2018        I discussed the  assessment and treatment plan with the patient. The patient was provided an opportunity to ask questions and all were answered. The patient agreed with the plan and demonstrated an understanding of the instructions.   The patient was advised to call back or seek an in-person evaluation if the symptoms worsen or if the condition fails to improve as anticipated.  I provided 25 minutes of non-face-to-face time during this encounter.   Crecencio Mc, MD

## 2018-06-30 NOTE — Assessment & Plan Note (Signed)
Continue Zoloft daily for management of anxiety manifested in somatic complaints and prn use of alprazolam for panic attacks. The risks and benefits of benzodiazepine use were discussed with patient today including excessive sedation leading to respiratory depression,  impaired thinking/driving, and addiction.  Patient was advised to avoid concurrent use with alcohol, to use medication only as needed and not to share with others  .

## 2018-06-30 NOTE — Assessment & Plan Note (Signed)
Trial of increased levothyroxine dose to 75 mcg, to help with weight gain  Recheck tsh in 6 weeks,

## 2018-06-30 NOTE — Assessment & Plan Note (Addendum)
Starting Crestor for management of LDL and triglycerides. Repeat labs in 6 weeks  s Lab Results  Component Value Date   CHOL 428 (H) 06/17/2018   HDL 48.80 06/17/2018   LDLCALC 230 (H) 06/07/2015   LDLDIRECT 283.0 06/17/2018   TRIG (H) 06/17/2018    422.0 Triglyceride is over 400; calculations on Lipids are invalid.   CHOLHDL 9 06/17/2018

## 2018-06-30 NOTE — Assessment & Plan Note (Signed)
She is at goal on current meds. Continue metformin  twice daily . will discuss medication change from beta blocker to ARB at next visit when BP can be assessed.   Lab Results  Component Value Date   HGBA1C 6.7 (H) 06/17/2018   Lab Results  Component Value Date   MICROALBUR 4.2 (H) 06/17/2018

## 2018-06-30 NOTE — Assessment & Plan Note (Signed)
Currently managed with metoprolol  and hct. Given new onset microalbuminuria, will recommend use of ARB at next visit  Lab Results  Component Value Date   MICROALBUR 4.2 (H) 06/17/2018

## 2018-06-30 NOTE — Assessment & Plan Note (Signed)
Continue Zoloft daily for management of anxiety manifested in somatic complaints.  Encouraging daily walk.

## 2018-07-07 ENCOUNTER — Encounter: Payer: Self-pay | Admitting: Internal Medicine

## 2018-09-14 ENCOUNTER — Other Ambulatory Visit: Payer: Self-pay | Admitting: Internal Medicine

## 2018-10-16 ENCOUNTER — Other Ambulatory Visit: Payer: Self-pay

## 2018-10-16 DIAGNOSIS — E119 Type 2 diabetes mellitus without complications: Secondary | ICD-10-CM

## 2018-10-16 MED ORDER — METFORMIN HCL 500 MG PO TABS: 500.0000 mg | ORAL_TABLET | Freq: Two times a day (BID) | ORAL | 1 refills | Status: DC

## 2018-12-10 ENCOUNTER — Other Ambulatory Visit: Payer: Self-pay

## 2018-12-10 DIAGNOSIS — I10 Essential (primary) hypertension: Secondary | ICD-10-CM

## 2018-12-10 DIAGNOSIS — R600 Localized edema: Secondary | ICD-10-CM

## 2018-12-10 MED ORDER — HYDROCHLOROTHIAZIDE 25 MG PO TABS: ORAL_TABLET | ORAL | 1 refills | Status: DC

## 2018-12-30 ENCOUNTER — Other Ambulatory Visit: Payer: Self-pay

## 2018-12-30 MED ORDER — SERTRALINE HCL 50 MG PO TABS: ORAL_TABLET | ORAL | 1 refills | Status: DC

## 2018-12-30 MED ORDER — LEVOTHYROXINE SODIUM 75 MCG PO TABS: 75.0000 ug | ORAL_TABLET | Freq: Every day | ORAL | 1 refills | Status: DC

## 2019-01-12 ENCOUNTER — Other Ambulatory Visit: Payer: Self-pay

## 2019-01-12 MED ORDER — ROSUVASTATIN CALCIUM 10 MG PO TABS: 10.0000 mg | ORAL_TABLET | Freq: Every day | ORAL | 1 refills | Status: DC

## 2019-03-04 ENCOUNTER — Ambulatory Visit (INDEPENDENT_AMBULATORY_CARE_PROVIDER_SITE_OTHER): Payer: BC Managed Care – PPO | Admitting: *Deleted

## 2019-03-04 ENCOUNTER — Other Ambulatory Visit: Payer: Self-pay

## 2019-03-04 DIAGNOSIS — Z23 Encounter for immunization: Secondary | ICD-10-CM

## 2019-03-04 NOTE — Progress Notes (Signed)
Patient received a flu vaccine in left arm & Pnemococcal- 23 in right arm.  Both were tolerated well

## 2019-03-11 ENCOUNTER — Other Ambulatory Visit: Payer: Self-pay | Admitting: Internal Medicine

## 2019-04-15 ENCOUNTER — Other Ambulatory Visit: Payer: Self-pay

## 2019-04-15 DIAGNOSIS — E119 Type 2 diabetes mellitus without complications: Secondary | ICD-10-CM

## 2019-04-15 MED ORDER — METFORMIN HCL 500 MG PO TABS: 500.0000 mg | ORAL_TABLET | Freq: Two times a day (BID) | ORAL | 0 refills | Status: DC

## 2019-06-29 ENCOUNTER — Other Ambulatory Visit: Payer: Self-pay

## 2019-06-29 MED ORDER — LEVOTHYROXINE SODIUM 75 MCG PO TABS: 75.0000 ug | ORAL_TABLET | Freq: Every day | ORAL | 0 refills | Status: DC

## 2019-06-29 MED ORDER — SERTRALINE HCL 50 MG PO TABS: ORAL_TABLET | ORAL | 0 refills | Status: DC

## 2019-07-13 ENCOUNTER — Telehealth: Payer: Self-pay

## 2019-07-13 DIAGNOSIS — Z0000 Encounter for general adult medical examination without abnormal findings: Secondary | ICD-10-CM

## 2019-07-13 DIAGNOSIS — I10 Essential (primary) hypertension: Secondary | ICD-10-CM

## 2019-07-13 DIAGNOSIS — E782 Mixed hyperlipidemia: Secondary | ICD-10-CM

## 2019-07-13 DIAGNOSIS — E039 Hypothyroidism, unspecified: Secondary | ICD-10-CM

## 2019-07-13 DIAGNOSIS — E1169 Type 2 diabetes mellitus with other specified complication: Secondary | ICD-10-CM

## 2019-07-13 MED ORDER — ROSUVASTATIN CALCIUM 10 MG PO TABS: 10.0000 mg | ORAL_TABLET | Freq: Every day | ORAL | 0 refills | Status: DC

## 2019-07-13 NOTE — Telephone Encounter (Signed)
Pt is scheduled for a physical on 07/27/2019 and a lab appt a few days prior to her physical. I have ordered TSH, A1c, CMP, Lipid panel, microalbumin, and CBC. Is there anything else that needs to be ordered?

## 2019-07-23 ENCOUNTER — Other Ambulatory Visit (INDEPENDENT_AMBULATORY_CARE_PROVIDER_SITE_OTHER): Payer: BC Managed Care – PPO

## 2019-07-23 ENCOUNTER — Other Ambulatory Visit: Payer: Self-pay

## 2019-07-23 DIAGNOSIS — I10 Essential (primary) hypertension: Secondary | ICD-10-CM | POA: Diagnosis not present

## 2019-07-23 DIAGNOSIS — Z0000 Encounter for general adult medical examination without abnormal findings: Secondary | ICD-10-CM

## 2019-07-23 DIAGNOSIS — E1169 Type 2 diabetes mellitus with other specified complication: Secondary | ICD-10-CM

## 2019-07-23 DIAGNOSIS — E039 Hypothyroidism, unspecified: Secondary | ICD-10-CM

## 2019-07-23 DIAGNOSIS — E119 Type 2 diabetes mellitus without complications: Secondary | ICD-10-CM

## 2019-07-23 DIAGNOSIS — E782 Mixed hyperlipidemia: Secondary | ICD-10-CM

## 2019-07-23 LAB — LIPID PANEL
Cholesterol: 249 mg/dL — ABNORMAL HIGH (ref 0–200)
HDL: 41.6 mg/dL (ref >39.00)
NonHDL: 207.25
Total CHOL/HDL Ratio: 6
Triglycerides: 316 mg/dL — ABNORMAL HIGH (ref 0.0–149.0)
VLDL: 63.2 mg/dL — ABNORMAL HIGH (ref 0.0–40.0)

## 2019-07-23 LAB — CBC WITH DIFFERENTIAL/PLATELET
Basophils Absolute: 0 10*3/uL (ref 0.0–0.1)
Basophils Relative: 0.5 % (ref 0.0–3.0)
Eosinophils Absolute: 0.2 10*3/uL (ref 0.0–0.7)
Eosinophils Relative: 2.8 % (ref 0.0–5.0)
HCT: 40 % (ref 36.0–46.0)
Hemoglobin: 13.9 g/dL (ref 12.0–15.0)
Lymphocytes Relative: 42.8 % (ref 12.0–46.0)
Lymphs Abs: 3.2 10*3/uL (ref 0.7–4.0)
MCHC: 34.7 g/dL (ref 30.0–36.0)
MCV: 90.4 fl (ref 78.0–100.0)
Monocytes Absolute: 0.4 10*3/uL (ref 0.1–1.0)
Monocytes Relative: 5.6 % (ref 3.0–12.0)
Neutro Abs: 3.6 10*3/uL (ref 1.4–7.7)
Neutrophils Relative %: 48.3 % (ref 43.0–77.0)
Platelets: 297 10*3/uL (ref 150.0–400.0)
RBC: 4.43 Mil/uL (ref 3.87–5.11)
RDW: 14.4 % (ref 11.5–15.5)
WBC: 7.5 10*3/uL (ref 4.0–10.5)

## 2019-07-23 LAB — COMPREHENSIVE METABOLIC PANEL
ALT: 47 U/L — ABNORMAL HIGH (ref 0–35)
AST: 35 U/L (ref 0–37)
Albumin: 4.6 g/dL (ref 3.5–5.2)
Alkaline Phosphatase: 79 U/L (ref 39–117)
BUN: 14 mg/dL (ref 6–23)
CO2: 29 mEq/L (ref 19–32)
Calcium: 10.2 mg/dL (ref 8.4–10.5)
Chloride: 99 mEq/L (ref 96–112)
Creatinine, Ser: 0.76 mg/dL (ref 0.40–1.20)
GFR: 80.24 mL/min (ref >60.00)
Glucose, Bld: 171 mg/dL — ABNORMAL HIGH (ref 70–99)
Potassium: 4 mEq/L (ref 3.5–5.1)
Sodium: 138 mEq/L (ref 135–145)
Total Bilirubin: 0.4 mg/dL (ref 0.2–1.2)
Total Protein: 7.5 g/dL (ref 6.0–8.3)

## 2019-07-23 LAB — LDL CHOLESTEROL, DIRECT: Direct LDL: 150 mg/dL

## 2019-07-23 LAB — MICROALBUMIN / CREATININE URINE RATIO
Creatinine,U: 189.3 mg/dL
Microalb Creat Ratio: 4.6 mg/g (ref 0.0–30.0)
Microalb, Ur: 8.8 mg/dL — ABNORMAL HIGH (ref 0.0–1.9)

## 2019-07-23 LAB — HEMOGLOBIN A1C: Hgb A1c MFr Bld: 7.8 % — ABNORMAL HIGH (ref 4.6–6.5)

## 2019-07-23 LAB — TSH: TSH: 3.28 u[IU]/mL (ref 0.35–4.50)

## 2019-07-23 MED ORDER — METFORMIN HCL 500 MG PO TABS
500.0000 mg | ORAL_TABLET | Freq: Two times a day (BID) | ORAL | 1 refills | Status: DC
Start: 2019-07-23 — End: 2020-01-25

## 2019-07-27 ENCOUNTER — Other Ambulatory Visit: Payer: Self-pay

## 2019-07-27 ENCOUNTER — Encounter: Payer: Self-pay | Admitting: Internal Medicine

## 2019-07-27 ENCOUNTER — Ambulatory Visit (INDEPENDENT_AMBULATORY_CARE_PROVIDER_SITE_OTHER): Payer: BC Managed Care – PPO | Admitting: Internal Medicine

## 2019-07-27 VITALS — BP 136/102 | HR 82 | Temp 96.9°F | Resp 15 | Ht 63.0 in | Wt 215.4 lb

## 2019-07-27 DIAGNOSIS — F411 Generalized anxiety disorder: Secondary | ICD-10-CM | POA: Diagnosis not present

## 2019-07-27 DIAGNOSIS — I10 Essential (primary) hypertension: Secondary | ICD-10-CM

## 2019-07-27 DIAGNOSIS — E1169 Type 2 diabetes mellitus with other specified complication: Secondary | ICD-10-CM

## 2019-07-27 DIAGNOSIS — F410 Panic disorder [episodic paroxysmal anxiety] without agoraphobia: Secondary | ICD-10-CM

## 2019-07-27 DIAGNOSIS — R748 Abnormal levels of other serum enzymes: Secondary | ICD-10-CM | POA: Diagnosis not present

## 2019-07-27 DIAGNOSIS — E782 Mixed hyperlipidemia: Secondary | ICD-10-CM

## 2019-07-27 DIAGNOSIS — Z1231 Encounter for screening mammogram for malignant neoplasm of breast: Secondary | ICD-10-CM | POA: Diagnosis not present

## 2019-07-27 DIAGNOSIS — Z0000 Encounter for general adult medical examination without abnormal findings: Secondary | ICD-10-CM

## 2019-07-27 DIAGNOSIS — E039 Hypothyroidism, unspecified: Secondary | ICD-10-CM

## 2019-07-27 DIAGNOSIS — E1159 Type 2 diabetes mellitus with other circulatory complications: Secondary | ICD-10-CM

## 2019-07-27 DIAGNOSIS — E669 Obesity, unspecified: Secondary | ICD-10-CM

## 2019-07-27 MED ORDER — LOSARTAN POTASSIUM-HCTZ 50-12.5 MG PO TABS
1.0000 | ORAL_TABLET | Freq: Every day | ORAL | 3 refills | Status: DC
Start: 2019-07-27 — End: 2020-06-06

## 2019-07-27 NOTE — Assessment & Plan Note (Signed)
Continue Zoloft daily for management of anxiety manifested in somatic complaints and prn use of alprazolam for panic attacks. The risks and benefits of benzodiazepine use were discussed with patient today including excessive sedation leading to respiratory depression,  impaired thinking/driving, and addiction.  Patient was advised to avoid concurrent use with alcohol, to use medication only as needed and not to share with others  .

## 2019-07-27 NOTE — Assessment & Plan Note (Signed)
Adding losartan   Lab Results  Component Value Date   MICROALBUR 8.8 (H) 07/23/2019   MICROALBUR 4.2 (H) 06/17/2018

## 2019-07-27 NOTE — Progress Notes (Signed)
Patient ID: Christina Stafford, female    DOB: Mar 27, 1970  Age: 49 y.o. MRN: [id number]  The patient is here for annual  [place]  examination and management of other chronic and acute problems.   The risk factors are reflected in the social history.  The roster of all physicians providing medical care to patient - is listed in the Snapshot section of the chart.  Activities of daily living:  The patient is 100% independent in all ADLs: dressing, toileting, feeding as well as independent mobility  Home safety : The patient has smoke detectors in the home. They wear seatbelts.  There are no firearms at home. There is no violence in the home.   There is no risks for hepatitis, STDs or HIV. There is no   history of blood transfusion. They have no travel history to infectious disease endemic areas of the world.  The patient has seen their dentist in the last six month. They have seen their eye doctor in the last year. They admit to slight hearing difficulty with regard to whispered voices and some television programs.  They have deferred audiologic testing in the last year.  They do not  have excessive sun exposure. Discussed the need for sun protection: hats, long sleeves and use of sunscreen if there is significant sun exposure.   Diet: the importance of a healthy diet is discussed. They do have a healthy diet.  The benefits of regular aerobic exercise were discussed. She walks 4 times per week ,  20 minutes.   Depression screen: there are no signs or vegative symptoms of depression- irritability, change in appetite, anhedonia, sadness/tearfullness.  The following portions of the patient's history were reviewed and updated as appropriate: allergies, current medications, past family history, past medical history,  past surgical history, past social history  and problem list.  Visual acuity was not assessed per patient preference since she has regular follow up with her ophthalmologist.  Hearing and body mass index were assessed and reviewed.   During the course of the visit the patient was educated and counseled about appropriate screening and preventive services including : fall prevention , diabetes screening, nutrition counseling, colorectal cancer screening, and recommended immunizations.    CC: The primary encounter diagnosis was Elevated liver enzymes. Diagnoses of DM type 2 with diabetic mixed hyperlipidemia ([employer]), Encounter for screening mammogram for malignant neoplasm of breast, Generalized anxiety disorder with panic attacks, Essential hypertension, Acquired hypothyroidism, Obesity, diabetes, and hypertension syndrome ([place]), and Encounter for preventive health examination were also pertinent to this visit.   1) UNCONTROLLED HTN  Has been taking hctz and metoprolol since last visit but not checking BP  2) UNCONTROLLED DM:  Taking metformin.  has not been following a low GI  Diet, exercising or checking blood sugars.   Working from home.   History Christina Stafford has a past medical history of Carpal tunnel syndrome on both sides, Complication of anesthesia, Depression, Family history of adverse reaction to anesthesia, GERD (gastroesophageal reflux disease), Heart murmur, Hyperlipidemia, Hypertension, Obesity, PONV (postoperative nausea and vomiting), Sleep apnea (2011), and Vertigo.   She has a past surgical history that includes Cholecystectomy (1995); Tonsillectomy and adenoidectomy (2003); Tubal ligation; Ovarian cyst removal; Breast surgery (2003); Laparoscopic vaginal hysterectomy with salpingo oophorectomy (Bilateral, 04/28/2017); Hysterectomy abdominal with salpingectomy; and Upper Extremity Angiography (Left, 05/19/2017).   Her family history includes Diabetes in her mother; Hyperlipidemia in her father; Hypertension in her mother; Uterine cancer in her mother.She reports that she has  quit smoking. Her smoking use included cigarettes. She started smoking about 8 years ago.  She has a 2.50 pack-year smoking history. She has never used smokeless tobacco. She reports current alcohol use of about 3.0 standard drinks of alcohol per week. She reports that she does not use drugs.  Outpatient Medications Prior to Visit  Medication Sig Dispense Refill  . ALPRAZolam (XANAX) 0.5 MG tablet Take 1 tablet (0.5 mg total) by mouth daily as needed for anxiety (or panic attacks). 30 tablet 2  . esomeprazole (NEXIUM) 20 MG capsule Take 20 mg by mouth every other day.     . levothyroxine (SYNTHROID) 75 MCG tablet Take 1 tablet (75 mcg total) by mouth daily. 30 tablet 0  . metFORMIN (GLUCOPHAGE) 500 MG tablet Take 1 tablet (500 mg total) by mouth 2 (two) times daily with a meal. 180 tablet 1  . metoprolol succinate (TOPROL-XL) 25 MG 24 hr tablet TAKE 1 TABLET(25 MG) BY MOUTH DAILY 90 tablet 1  . rosuvastatin (CRESTOR) 10 MG tablet Take 1 tablet (10 mg total) by mouth daily. 90 tablet 0  . sertraline (ZOLOFT) 50 MG tablet Take one and 1/2 tablets by mouth daily 45 tablet 0  . hydrochlorothiazide (HYDRODIURIL) 25 MG tablet TAKE 1 TABLET BY MOUTH EVERY DAY, DISCONTINUE NORVASC 2.5 90 tablet 1  . tetrahydrozoline 0.05 % ophthalmic solution Place 2 drops into both eyes as needed (dry eyes).     No facility-administered medications prior to visit.    Review of Systems   Patient denies headache, fevers, malaise, unintentional weight loss, skin rash, eye pain, sinus congestion and sinus pain, sore throat, dysphagia,  hemoptysis , cough, dyspnea, wheezing, chest pain, palpitations, orthopnea, edema, abdominal pain, nausea, melena, diarrhea, constipation, flank pain, dysuria, hematuria, urinary  Frequency, nocturia, numbness, tingling, seizures,  Focal weakness, Loss of consciousness,  Tremor, insomnia, depression, anxiety, and suicidal ideation.      Objective:  BP (!) 136/102 (BP Location: Left Arm, Patient Position: Sitting, Cuff Size: Normal)   Pulse 82   Temp (!) 96.9 F (36.1 C)  (Temporal)   Resp 15   Ht 5' 3"  (1.6 m)   Wt 215 lb 6.4 oz (97.7 kg)   LMP 01/20/2017 (Approximate)   SpO2 98%   BMI 38.16 kg/m   Physical Exam   General appearance: alert, cooperative and appears stated age Head: Normocephalic, without obvious abnormality, atraumatic Eyes: conjunctivae/corneas clear. PERRL, EOM's intact. Fundi benign. Ears: normal TM's and external ear canals both ears Nose: Nares normal. Septum midline. Mucosa normal. No drainage or sinus tenderness. Throat: lips, mucosa, and tongue normal; teeth and gums normal Neck: no adenopathy, no carotid bruit, no JVD, supple, symmetrical, trachea midline and thyroid not enlarged, symmetric, no tenderness/mass/nodules Lungs: clear to auscultation bilaterally Breasts: normal appearance, no masses or tenderness Heart: regular rate and rhythm, S1, S2 normal, no murmur, click, rub or gallop Abdomen: soft, non-tender; bowel sounds normal; no masses,  no organomegaly Extremities: extremities normal, atraumatic, no cyanosis or edema Pulses: 2+ and symmetric Skin: Skin color, texture, turgor normal. No rashes or lesions Neurologic: Alert and oriented X 3, normal strength and tone. Normal symmetric reflexes. Normal coordination and gait.      Assessment & Plan:   Problem List Items Addressed This Visit      Unprioritized   DM type 2 with diabetic mixed hyperlipidemia ([place])    She has deferred additional medications in favor of a lifestyle overhaul with low GI diet and exercise program.  Lab  Results  Component Value Date   HGBA1C 7.8 (H) 07/23/2019    s Lab Results  Component Value Date   CHOL 249 (H) 07/23/2019   HDL 41.60 07/23/2019   LDLCALC 230 (H) 06/07/2015   LDLDIRECT 150.0 07/23/2019   TRIG 316.0 (H) 07/23/2019   CHOLHDL 6 07/23/2019         Relevant Medications   losartan-hydrochlorothiazide (HYZAAR) 50-12.5 MG tablet   Other Relevant Orders   Hemoglobin A1c   Lipid panel   Encounter for preventive  health examination    Annual comprehensive preventive exam was done as well as an evaluation and management of chronic conditions .  During the course of the visit the patient was educated and counseled about appropriate screening and preventive services including :  diabetes screening, lipid analysis with projected  10 year  risk for CAD  Which is 4.7 % using the Framingham risk calculator for women, , nutrition counseling, colorectal cancer screening, and recommended immunizations.  Printed recommendations for health maintenance screenings was given.        Generalized anxiety disorder with panic attacks    Continue Zoloft daily for management of anxiety manifested in somatic complaints and prn use of alprazolam for panic attacks. The risks and benefits of benzodiazepine use were discussed with patient today including excessive sedation leading to respiratory depression,  impaired thinking/driving, and addiction.  Patient was advised to avoid concurrent use with alcohol, to use medication only as needed and not to share with others  .       Hypertension    Adding losartan   Lab Results  Component Value Date   MICROALBUR 8.8 (H) 07/23/2019   MICROALBUR 4.2 (H) 06/17/2018           Relevant Medications   losartan-hydrochlorothiazide (HYZAAR) 50-12.5 MG tablet   Other Relevant Orders   Basic metabolic panel   Hypothyroidism    Thyroid function is WNL on current dose.  No current changes needed.   Lab Results  Component Value Date   TSH 3.28 07/23/2019         Obesity, diabetes, and hypertension syndrome ([place])    She is not at goal on current meds. Continue metformin  twice daily . Adding ARB for BP management and control of microalbuminuria.  Optavia diet discussed as she has plans to start it.    Lab Results  Component Value Date   HGBA1C 7.8 (H) 07/23/2019   Lab Results  Component Value Date   MICROALBUR 8.8 (H) 07/23/2019          Relevant Medications    losartan-hydrochlorothiazide (HYZAAR) 50-12.5 MG tablet    Other Visit Diagnoses    Elevated liver enzymes    -  Primary   Relevant Orders   Comprehensive metabolic panel   ANA   Anti-Smith antibody   Hepatitis B core antibody, total   Hepatitis B surface antibody,qualitative   Hepatitis B surface antigen   Hepatitis C antibody   IBC + Ferritin   Mitochondrial antibodies   Encounter for screening mammogram for malignant neoplasm of breast       Relevant Orders   MM 3D SCREEN BREAST BILATERAL      I have discontinued Christina Stafford's tetrahydrozoline and hydrochlorothiazide. I am also having her start on losartan-hydrochlorothiazide. Additionally, I am having her maintain her esomeprazole, ALPRAZolam, metoprolol succinate, sertraline, levothyroxine, rosuvastatin, and metFORMIN.  Meds ordered this encounter  Medications  . losartan-hydrochlorothiazide (HYZAAR) 50-12.5 MG tablet    Sig:  Take 1 tablet by mouth daily.    Dispense:  90 tablet    Refill:  3    Medications Discontinued During This Encounter  Medication Reason  . tetrahydrozoline 0.05 % ophthalmic solution Completed Course  . hydrochlorothiazide (HYDRODIURIL) 25 MG tablet     Follow-up: Return in about 3 months (around 10/27/2019) for ,  .   Crecencio Mc, MD

## 2019-07-27 NOTE — Assessment & Plan Note (Signed)
She is not at goal on current meds. Continue metformin  twice daily . Adding ARB for BP management and control of microalbuminuria.  Optavia diet discussed as she has plans to start it.    Lab Results  Component Value Date   HGBA1C 7.8 (H) 07/23/2019   Lab Results  Component Value Date   MICROALBUR 8.8 (H) 07/23/2019

## 2019-07-27 NOTE — Assessment & Plan Note (Addendum)
She has deferred additional medications in favor of a lifestyle overhaul with low GI diet and exercise program.  Lab Results  Component Value Date   HGBA1C 7.8 (H) 07/23/2019    s Lab Results  Component Value Date   CHOL 249 (H) 07/23/2019   HDL 41.60 07/23/2019   LDLCALC 230 (H) 06/07/2015   LDLDIRECT 150.0 07/23/2019   TRIG 316.0 (H) 07/23/2019   CHOLHDL 6 07/23/2019

## 2019-07-27 NOTE — Assessment & Plan Note (Signed)
Annual comprehensive preventive exam was done as well as an evaluation and management of chronic conditions .  During the course of the visit the patient was educated and counseled about appropriate screening and preventive services including :  diabetes screening, lipid analysis with projected  10 year  risk for CAD  Which is 4.7 % using the Framingham risk calculator for women, , nutrition counseling, colorectal cancer screening, and recommended immunizations.  Printed recommendations for health maintenance screenings was given.  

## 2019-07-27 NOTE — Patient Instructions (Addendum)
[employer] makes the best BP machines for home use   The Optavia diet is an excellent  Choice  for low carb dieters  Adding losartan to hctz in a combo pill for blood pressure  Continue metoprolol at night   Goal B is 130/80 or less (under 120/70 may make you dizzy)   Check sugars 1-2 times daily . Pick a time:  Fasting or 2 hours post meal (Post prandial)

## 2019-07-27 NOTE — Assessment & Plan Note (Signed)
Thyroid function is WNL on current dose.  No current changes needed.   Lab Results  Component Value Date   TSH 3.28 07/23/2019

## 2019-07-29 MED ORDER — BLOOD GLUCOSE METER KIT: PACK | 0 refills | Status: AC

## 2019-08-03 ENCOUNTER — Other Ambulatory Visit: Payer: Self-pay

## 2019-08-03 MED ORDER — SERTRALINE HCL 50 MG PO TABS: ORAL_TABLET | ORAL | 1 refills | Status: DC

## 2019-08-03 MED ORDER — LEVOTHYROXINE SODIUM 75 MCG PO TABS: 75.0000 ug | ORAL_TABLET | Freq: Every day | ORAL | 1 refills | Status: DC

## 2019-09-01 ENCOUNTER — Other Ambulatory Visit: Payer: Self-pay

## 2019-09-07 ENCOUNTER — Other Ambulatory Visit: Payer: Self-pay | Admitting: Internal Medicine

## 2019-09-26 MED ORDER — HYDROCHLOROTHIAZIDE 12.5 MG PO CAPS: 12.5000 mg | ORAL_CAPSULE | Freq: Every day | ORAL | 1 refills | Status: DC

## 2019-09-29 ENCOUNTER — Other Ambulatory Visit: Payer: Self-pay

## 2019-09-29 MED ORDER — GLUCOSE BLOOD VI STRP: ORAL_STRIP | 5 refills | Status: DC

## 2019-10-01 ENCOUNTER — Other Ambulatory Visit: Payer: Self-pay | Admitting: Internal Medicine

## 2019-10-06 ENCOUNTER — Other Ambulatory Visit: Payer: Self-pay | Admitting: Internal Medicine

## 2019-10-21 ENCOUNTER — Other Ambulatory Visit: Payer: BC Managed Care – PPO

## 2019-10-21 ENCOUNTER — Other Ambulatory Visit: Payer: Self-pay

## 2019-10-21 DIAGNOSIS — Z20822 Contact with and (suspected) exposure to covid-19: Secondary | ICD-10-CM

## 2019-10-22 LAB — SARS-COV-2, NAA 2 DAY TAT

## 2019-10-22 LAB — NOVEL CORONAVIRUS, NAA: SARS-CoV-2, NAA: NOT DETECTED

## 2019-11-02 ENCOUNTER — Other Ambulatory Visit: Payer: Self-pay | Admitting: Internal Medicine

## 2019-11-04 ENCOUNTER — Other Ambulatory Visit: Payer: Self-pay

## 2019-11-04 MED ORDER — SERTRALINE HCL 50 MG PO TABS: ORAL_TABLET | ORAL | 5 refills | Status: DC

## 2019-11-08 ENCOUNTER — Other Ambulatory Visit (INDEPENDENT_AMBULATORY_CARE_PROVIDER_SITE_OTHER): Payer: BC Managed Care – PPO

## 2019-11-08 ENCOUNTER — Other Ambulatory Visit: Payer: Self-pay

## 2019-11-08 DIAGNOSIS — E1169 Type 2 diabetes mellitus with other specified complication: Secondary | ICD-10-CM

## 2019-11-08 DIAGNOSIS — R748 Abnormal levels of other serum enzymes: Secondary | ICD-10-CM

## 2019-11-08 DIAGNOSIS — E782 Mixed hyperlipidemia: Secondary | ICD-10-CM

## 2019-11-08 LAB — COMPREHENSIVE METABOLIC PANEL
ALT: 40 U/L — ABNORMAL HIGH (ref 0–35)
AST: 30 U/L (ref 0–37)
Albumin: 4.6 g/dL (ref 3.5–5.2)
Alkaline Phosphatase: 81 U/L (ref 39–117)
BUN: 14 mg/dL (ref 6–23)
CO2: 24 mEq/L (ref 19–32)
Calcium: 10 mg/dL (ref 8.4–10.5)
Chloride: 102 mEq/L (ref 96–112)
Creatinine, Ser: 0.73 mg/dL (ref 0.40–1.20)
GFR: 83.96 mL/min (ref >60.00)
Glucose, Bld: 134 mg/dL — ABNORMAL HIGH (ref 70–99)
Potassium: 4.1 mEq/L (ref 3.5–5.1)
Sodium: 140 mEq/L (ref 135–145)
Total Bilirubin: 0.4 mg/dL (ref 0.2–1.2)
Total Protein: 7.3 g/dL (ref 6.0–8.3)

## 2019-11-08 LAB — LIPID PANEL
Cholesterol: 237 mg/dL — ABNORMAL HIGH (ref 0–200)
HDL: 46.6 mg/dL (ref >39.00)
NonHDL: 190.2
Total CHOL/HDL Ratio: 5
Triglycerides: 272 mg/dL — ABNORMAL HIGH (ref 0.0–149.0)
VLDL: 54.4 mg/dL — ABNORMAL HIGH (ref 0.0–40.0)

## 2019-11-08 LAB — HEMOGLOBIN A1C: Hgb A1c MFr Bld: 7.1 % — ABNORMAL HIGH (ref 4.6–6.5)

## 2019-11-08 LAB — IBC + FERRITIN
Ferritin: 32.3 ng/mL (ref 10.0–291.0)
Iron: 90 ug/dL (ref 42–145)
Saturation Ratios: 17.3 % — ABNORMAL LOW (ref 20.0–50.0)
Transferrin: 372 mg/dL — ABNORMAL HIGH (ref 212.0–360.0)

## 2019-11-08 LAB — LDL CHOLESTEROL, DIRECT: Direct LDL: 147 mg/dL

## 2019-11-09 LAB — ANTI-SMITH ANTIBODY: ENA SM Ab Ser-aCnc: <1 AI

## 2019-11-10 LAB — ANA: Anti Nuclear Antibody (ANA): POSITIVE — AB

## 2019-11-10 LAB — MITOCHONDRIAL ANTIBODIES: Mitochondrial M2 Ab, IgG: <=20 U

## 2019-11-10 LAB — HEPATITIS C ANTIBODY
Hepatitis C Ab: NONREACTIVE
SIGNAL TO CUT-OFF: 0.16 (ref <1.00)

## 2019-11-10 LAB — ANTI-NUCLEAR AB-TITER (ANA TITER): ANA Titer 1: 1:80 {titer} — ABNORMAL HIGH

## 2019-11-10 LAB — HEPATITIS B SURFACE ANTIBODY,QUALITATIVE: Hep B S Ab: NONREACTIVE

## 2019-11-10 LAB — HEPATITIS B CORE ANTIBODY, TOTAL: Hep B Core Total Ab: NONREACTIVE

## 2019-11-10 LAB — HEPATITIS B SURFACE ANTIGEN: Hepatitis B Surface Ag: NONREACTIVE

## 2019-11-11 ENCOUNTER — Encounter: Payer: Self-pay | Admitting: Internal Medicine

## 2019-11-11 ENCOUNTER — Telehealth (INDEPENDENT_AMBULATORY_CARE_PROVIDER_SITE_OTHER): Payer: BC Managed Care – PPO | Admitting: Internal Medicine

## 2019-11-11 VITALS — BP 138/102 | HR 72 | Ht 63.0 in | Wt 208.0 lb

## 2019-11-11 DIAGNOSIS — R7401 Elevation of levels of liver transaminase levels: Secondary | ICD-10-CM | POA: Diagnosis not present

## 2019-11-11 DIAGNOSIS — E1169 Type 2 diabetes mellitus with other specified complication: Secondary | ICD-10-CM

## 2019-11-11 DIAGNOSIS — E039 Hypothyroidism, unspecified: Secondary | ICD-10-CM | POA: Diagnosis not present

## 2019-11-11 DIAGNOSIS — E669 Obesity, unspecified: Secondary | ICD-10-CM

## 2019-11-11 DIAGNOSIS — I152 Hypertension secondary to endocrine disorders: Secondary | ICD-10-CM

## 2019-11-11 DIAGNOSIS — R748 Abnormal levels of other serum enzymes: Secondary | ICD-10-CM

## 2019-11-11 DIAGNOSIS — E1159 Type 2 diabetes mellitus with other circulatory complications: Secondary | ICD-10-CM

## 2019-11-11 DIAGNOSIS — E782 Mixed hyperlipidemia: Secondary | ICD-10-CM

## 2019-11-11 DIAGNOSIS — I10 Essential (primary) hypertension: Secondary | ICD-10-CM

## 2019-11-11 DIAGNOSIS — R768 Other specified abnormal immunological findings in serum: Secondary | ICD-10-CM

## 2019-11-11 MED ORDER — ALPRAZOLAM 0.5 MG PO TABS: 0.5000 mg | ORAL_TABLET | Freq: Every day | ORAL | 0 refills | Status: DC | PRN

## 2019-11-11 NOTE — Progress Notes (Signed)
Virtual Visit via [place]   This visit type was conducted due to national recommendations for restrictions regarding the COVID-19 pandemic (e.g. social distancing).  This format is felt to be most appropriate for this patient at this time.  All issues noted in this document were discussed and addressed.  No physical exam was performed (except for noted visual exam findings with Video Visits).   I connected with@ on 11/11/19 at  9:00 AM EDT by a video enabled telemedicine application and verified that I am speaking with the correct person using two identifiers. Location patient: home Location provider: work or home office Persons participating in the virtual visit: patient, provider  I discussed the limitations, risks, security and privacy concerns of performing an evaluation and management service by telephone and the availability of in person appointments. I also discussed with the patient that there may be a patient responsible charge related to this service. The patient expressed understanding and agreed to proceed.  Reason for visit: follow up on type 2 DM, obesity and elevated liver enzymes  HPI: 49 yr old female with recently diagnosed type 2 DM,  Obesity and chronic asymptomatic liver enzyme elevation presents for discussion of recent test results.  She feels great,  Has started exercising regularly and following a careful diet.  7 lb  Weight loss since May.  Reviewed alcohol consumption,   Occasionally has 2-3 glasses of wine on weekend nights  1 during the week   Labs reviewed; discussed the potential relevance of a positive ANA      ROS: See pertinent positives and negatives per HPI.  Past Medical History:  Diagnosis Date  . Carpal tunnel syndrome on both sides   . Complication of anesthesia   . Depression   . Family history of adverse reaction to anesthesia    N/V  . GERD (gastroesophageal reflux disease)   . Heart murmur    DURING PREGNANCY  . Hyperlipidemia   .  Hypertension   . Obesity   . PONV (postoperative nausea and vomiting)   . Sleep apnea 2011   using CPAP 6 cm H20  [facility] Sleep Study  . Vertigo     Past Surgical History:  Procedure Laterality Date  . BREAST SURGERY  2003   negative biopsy  . CHOLECYSTECTOMY  1995  . HYSTERECTOMY ABDOMINAL WITH SALPINGECTOMY    . LAPAROSCOPIC VAGINAL HYSTERECTOMY WITH SALPINGO OOPHORECTOMY Bilateral 04/28/2017   Procedure: LAPAROSCOPIC ASSISTED VAGINAL HYSTERECTOMY WITH BILATERAL SALPINGO OOPHORECTOMY with Cystoscopy;  Surgeon: Brayton Mars, MD;  Location: [facility] ORS;  Service: Gynecology;  Laterality: Bilateral;  . OVARIAN CYST REMOVAL    . TONSILLECTOMY AND ADENOIDECTOMY  2003  . TUBAL LIGATION    . UPPER EXTREMITY ANGIOGRAPHY Left 05/19/2017   Procedure: UPPER EXTREMITY ANGIOGRAPHY;  Surgeon: Algernon Huxley, MD;  Location: [facility];  Service: Cardiovascular;  Laterality: Left;    Family History  Problem Relation Age of Onset  . Hypertension Mother   . Diabetes Mother   . Uterine cancer Mother   . Hyperlipidemia Father   . Breast cancer Neg Hx   . Ovarian cancer Neg Hx   . Colon cancer Neg Hx     SOCIAL HX:  reports that she has quit smoking. Her smoking use included cigarettes. She started smoking about 8 years ago. She has a 2.50 pack-year smoking history. She has never used smokeless tobacco. She reports current alcohol use of about 3.0 standard drinks of alcohol per week. She reports that  she does not use drugs.   Current Outpatient Medications:  .  ALPRAZolam (XANAX) 0.5 MG tablet, Take 1 tablet (0.5 mg total) by mouth daily as needed for anxiety (or panic attacks)., Disp: 30 tablet, Rfl: 0 .  blood glucose meter kit and supplies, Dispense based on patient and insurance preference. Use to check blood sugars twice daily., Disp: 1 each, Rfl: 0 .  esomeprazole (NEXIUM) 20 MG capsule, Take 20 mg by mouth every other day. , Disp: , Rfl:  .  glucose blood test strip, Use to check  blood sugars twice daily., Disp: 100 each, Rfl: 5 .  hydrochlorothiazide (MICROZIDE) 12.5 MG capsule, TAKE 1 CAPSULE(12.5 MG) BY MOUTH DAILY, Disp: 30 capsule, Rfl: 1 .  levothyroxine (SYNTHROID) 75 MCG tablet, Take 1 tablet (75 mcg total) by mouth daily., Disp: 90 tablet, Rfl: 1 .  losartan-hydrochlorothiazide (HYZAAR) 50-12.5 MG tablet, Take 1 tablet by mouth daily., Disp: 90 tablet, Rfl: 3 .  metFORMIN (GLUCOPHAGE) 500 MG tablet, Take 1 tablet (500 mg total) by mouth 2 (two) times daily with a meal., Disp: 180 tablet, Rfl: 1 .  metoprolol succinate (TOPROL-XL) 25 MG 24 hr tablet, TAKE 1 TABLET(25 MG) BY MOUTH DAILY, Disp: 90 tablet, Rfl: 1 .  rosuvastatin (CRESTOR) 10 MG tablet, TAKE 1 TABLET(10 MG) BY MOUTH DAILY, Disp: 90 tablet, Rfl: 0 .  sertraline (ZOLOFT) 50 MG tablet, TAKE 1 AND 1/2 TABLETS BY MOUTH DAILY, Disp: 45 tablet, Rfl: 5  EXAM:  VITALS per patient if applicable:  GENERAL: alert, oriented, appears well and in no acute distress  HEENT: atraumatic, conjunttiva clear, no obvious abnormalities on inspection of external nose and ears  NECK: normal movements of the head and neck  LUNGS: on inspection no signs of respiratory distress, breathing rate appears normal, no obvious gross SOB, gasping or wheezing  CV: no obvious cyanosis  MS: moves all visible extremities without noticeable abnormality  PSYCH/NEURO: pleasant and cooperative, no obvious depression or anxiety, speech and thought processing grossly intact  ASSESSMENT AND PLAN:  Discussed the following assessment and plan:  Acquired hypothyroidism - Plan: TSH  DM type 2 with diabetic mixed hyperlipidemia (HCC) - Plan: Hemoglobin A1c, Lipid panel, Comprehensive metabolic panel  Positive ANA (antinuclear antibody) - Plan: Ambulatory referral to Gastroenterology  Elevated ALT measurement - Plan: Ambulatory referral to Gastroenterology  Essential hypertension  Elevated liver enzymes  Obesity, diabetes, and  hypertension syndrome ([place])  DM type 2 with diabetic mixed hyperlipidemia ([place]) She is at goal on current meds. Continue metformin  twice daily,  ARB for BP management and control of microalbuminuria. Continue the  [place] as she Is losing weight   Lab Results  Component Value Date   HGBA1C 7.1 (H) 11/08/2019   Lab Results  Component Value Date   MICROALBUR 8.8 (H) 07/23/2019      Hypertension Well controlled on current regimen. Renal function stable, no changes today.  Lab Results  Component Value Date   CREATININE 0.73 11/08/2019   Lab Results  Component Value Date   NA 140 11/08/2019   K 4.1 11/08/2019   CL 102 11/08/2019   CO2 24 11/08/2019     Elevated liver enzymes Screening labs for autoimmune /viral etiologies resulted in a positive ANA, negative viral screen. Referring to Dr Daphane Shepherd or Dr Jolly Mango for futher evaluation  Advised to reduce alcohol use as much as possible, avoid tylenol products.    Lab Results  Component Value Date   ALT 40 (H) 11/08/2019  AST 30 11/08/2019   ALKPHOS 81 11/08/2019   BILITOT 0.4 11/08/2019   Lab Results  Component Value Date   ANA POSITIVE (A) 11/08/2019     Obesity, diabetes, and hypertension syndrome ([place]) She is losing weight on the commercial Optavia diet and BP and DM are now under better control.     I discussed the assessment and treatment plan with the patient. The patient was provided an opportunity to ask questions and all were answered. The patient agreed with the plan and demonstrated an understanding of the instructions.   The patient was advised to call back or seek an in-person evaluation if the symptoms worsen or if the condition fails to improve as anticipated.  I provided 30 minutes of non-face-to-face time during this encounter.   Crecencio Mc, MD

## 2019-11-11 NOTE — Patient Instructions (Signed)
Referral to [facility] to evaluate positive ANA /elevated ALT  CONTINUE THE EXCELLENT LIFESTYLE CHANGES YOU HAVE MADE!  FASTING LABS IN 3 MONTHS  FOLLOW UP WITH ME IN 6

## 2019-11-13 DIAGNOSIS — K7581 Nonalcoholic steatohepatitis (NASH): Secondary | ICD-10-CM | POA: Insufficient documentation

## 2019-11-13 NOTE — Assessment & Plan Note (Signed)
Screening labs for autoimmune /viral etiologies resulted in a positive ANA, negative viral screen. Referring to Dr Daphane Shepherd or Dr Jolly Mango for futher evaluation  Advised to reduce alcohol use as much as possible, avoid tylenol products.    Lab Results  Component Value Date   ALT 40 (H) 11/08/2019   AST 30 11/08/2019   ALKPHOS 81 11/08/2019   BILITOT 0.4 11/08/2019   Lab Results  Component Value Date   ANA POSITIVE (A) 11/08/2019

## 2019-11-13 NOTE — Assessment & Plan Note (Signed)
She is losing weight on the commercial Optavia diet and BP and DM are now under better control.

## 2019-11-13 NOTE — Assessment & Plan Note (Signed)
Well controlled on current regimen. Renal function stable, no changes today.  Lab Results  Component Value Date   CREATININE 0.73 11/08/2019   Lab Results  Component Value Date   NA 140 11/08/2019   K 4.1 11/08/2019   CL 102 11/08/2019   CO2 24 11/08/2019

## 2019-11-13 NOTE — Assessment & Plan Note (Signed)
She is at goal on current meds. Continue metformin  twice daily,  ARB for BP management and control of microalbuminuria. Continue the  [place] as she Is losing weight   Lab Results  Component Value Date   HGBA1C 7.1 (H) 11/08/2019   Lab Results  Component Value Date   MICROALBUR 8.8 (H) 07/23/2019

## 2019-12-08 ENCOUNTER — Other Ambulatory Visit: Payer: BC Managed Care – PPO

## 2019-12-08 DIAGNOSIS — Z20822 Contact with and (suspected) exposure to covid-19: Secondary | ICD-10-CM

## 2019-12-09 LAB — SARS-COV-2, NAA 2 DAY TAT

## 2019-12-09 LAB — NOVEL CORONAVIRUS, NAA: SARS-CoV-2, NAA: NOT DETECTED

## 2020-01-01 ENCOUNTER — Other Ambulatory Visit: Payer: Self-pay | Admitting: Internal Medicine

## 2020-01-18 ENCOUNTER — Ambulatory Visit (INDEPENDENT_AMBULATORY_CARE_PROVIDER_SITE_OTHER): Payer: BC Managed Care – PPO | Admitting: Podiatry

## 2020-01-18 ENCOUNTER — Ambulatory Visit: Payer: BLUE CROSS/BLUE SHIELD | Admitting: Podiatry

## 2020-01-18 ENCOUNTER — Other Ambulatory Visit: Payer: Self-pay

## 2020-01-18 ENCOUNTER — Ambulatory Visit (INDEPENDENT_AMBULATORY_CARE_PROVIDER_SITE_OTHER): Payer: BC Managed Care – PPO

## 2020-01-18 DIAGNOSIS — M722 Plantar fascial fibromatosis: Secondary | ICD-10-CM

## 2020-01-18 MED ORDER — MELOXICAM 15 MG PO TABS: 15.0000 mg | ORAL_TABLET | Freq: Every day | ORAL | 1 refills | Status: DC

## 2020-01-18 NOTE — Progress Notes (Signed)
   Subjective: 49 y.o. female presenting today as a new patient for evaluation of left heel pain this been going on for several weeks now.  Patient relates pain more so in the mornings getting out of bed.  She has been wearing dance go shoes or good supportive running shoes with OTC arch supports from [employer] running store.  She states that the pain has improved however it is continuing to linger especially in the mornings getting out of bed.  She presents for further treatment and evaluation   Past Medical History:  Diagnosis Date  . Carpal tunnel syndrome on both sides   . Complication of anesthesia   . Depression   . Family history of adverse reaction to anesthesia    N/V  . GERD (gastroesophageal reflux disease)   . Heart murmur    DURING PREGNANCY  . Hyperlipidemia   . Hypertension   . Obesity   . PONV (postoperative nausea and vomiting)   . Sleep apnea 2011   using CPAP 6 cm H20  [facility] Sleep Study  . Vertigo      Objective: Physical Exam General: The patient is alert and oriented x3 in no acute distress.  Dermatology: Skin is warm, dry and supple bilateral lower extremities. Negative for open lesions or macerations bilateral.   Vascular: Dorsalis Pedis and Posterior Tibial pulses palpable bilateral.  Capillary fill time is immediate to all digits.  Neurological: Epicritic and protective threshold intact bilateral.   Musculoskeletal: Tenderness to palpation to the plantar aspect of the left heel along the plantar fascia. All other joints range of motion within normal limits bilateral. Strength 5/5 in all groups bilateral.   Radiographic exam: Normal osseous mineralization. Joint spaces preserved. No fracture/dislocation/boney destruction. No other soft tissue abnormalities or radiopaque foreign bodies.   Assessment: 1. Plantar fasciitis left foot  Plan of Care:  1. Patient evaluated. Xrays reviewed.   2.  Patient declined injection today  3.  Medrol Dosepak was  offered however the patient states that the prednisone pack to make her emotional.  She declined Medrol Dosepak today 4. Rx for Meloxicam ordered for patient. 5.  Night splint dispensed.  Wear nightly 6. Instructed patient regarding therapies and modalities at home to alleviate symptoms.  Continue good supportive shoes and not going barefoot 7. Return to clinic as needed.  If the pain persists we may need to proceed with a steroidal injection  *Works for [employer] in [place], [place] [facility]  Dr. Edrick Kins, DPM    [address], [place] [address]                Office [phone]  Fax [phone]

## 2020-01-25 ENCOUNTER — Other Ambulatory Visit: Payer: Self-pay | Admitting: Internal Medicine

## 2020-01-25 ENCOUNTER — Other Ambulatory Visit: Payer: Self-pay

## 2020-01-25 ENCOUNTER — Ambulatory Visit (INDEPENDENT_AMBULATORY_CARE_PROVIDER_SITE_OTHER): Payer: BC Managed Care – PPO | Admitting: Gastroenterology

## 2020-01-25 ENCOUNTER — Encounter: Payer: Self-pay | Admitting: Gastroenterology

## 2020-01-25 VITALS — BP 126/84 | HR 78 | Temp 98.2°F | Ht 63.0 in | Wt 211.4 lb

## 2020-01-25 DIAGNOSIS — E119 Type 2 diabetes mellitus without complications: Secondary | ICD-10-CM

## 2020-01-25 DIAGNOSIS — R768 Other specified abnormal immunological findings in serum: Secondary | ICD-10-CM | POA: Diagnosis not present

## 2020-01-25 DIAGNOSIS — K219 Gastro-esophageal reflux disease without esophagitis: Secondary | ICD-10-CM

## 2020-01-25 DIAGNOSIS — R7401 Elevation of levels of liver transaminase levels: Secondary | ICD-10-CM | POA: Diagnosis not present

## 2020-01-25 DIAGNOSIS — Z1211 Encounter for screening for malignant neoplasm of colon: Secondary | ICD-10-CM

## 2020-01-25 MED ORDER — CLENPIQ 10-3.5-12 MG-GM -GM/160ML PO SOLN: 320.0000 mL | Freq: Once | ORAL | 0 refills | Status: AC

## 2020-01-25 NOTE — Progress Notes (Signed)
Cephas Darby, MD [address]  [place]  [place], [place] [address]  Main: [phone]  Fax: [phone]    Gastroenterology Consultation  Referring Provider:     Crecencio Mc, MD Primary Care Physician:  Crecencio Mc, MD Primary Gastroenterologist:  Dr. Cephas Darby Reason for Consultation:     Elevated ALT, elevated ANA        HPI:   Christina Stafford is a 49 y.o. female referred by Dr. Crecencio Mc, MD  for consultation & management of elevated ALT levels.  Patient has history of metabolic syndrome, he is found to have chronically elevated ALT since 06/2018, 1-2 times upper limit of normal, secondary liver disease work-up revealed elevated ANA levels.  Hepatitis A, B and C serologies negative, anti-smooth muscle, antimitochondrial antibodies negative, ferritin levels were normal.  Patient reports that over last 1 year, she has been gaining weight and her hemoglobin A1c has been up trending.  Within last 3 months, her A1c has slightly improved.  She reports that she went through significant stress at her work in last few months.  She stopped working out, going to gym which she used to do and that has resulted in weight loss in the past.  She does report family history of lupus  Patient does not drink carbonated beverages, sweet tea She does have history of chronic heartburn for which she takes omeprazole once a day for more than 2 years at least.  Coffee triggers her heartburn  NSAIDs: None  Antiplts/Anticoagulants/Anti thrombotics: None  GI Procedures: None She denies family history of GI malignancy, chronic liver condition   Past Medical History:  Diagnosis Date  . Carpal tunnel syndrome on both sides   . Complication of anesthesia   . Depression   . Family history of adverse reaction to anesthesia    N/V  . GERD (gastroesophageal reflux disease)   . Heart murmur    DURING PREGNANCY  . Hyperlipidemia   . Hypertension   . Obesity   . PONV  (postoperative nausea and vomiting)   . Sleep apnea 2011   using CPAP 6 cm H20  [facility] Sleep Study  . Vertigo     Past Surgical History:  Procedure Laterality Date  . BREAST SURGERY  2003   negative biopsy  . CHOLECYSTECTOMY  1995  . HYSTERECTOMY ABDOMINAL WITH SALPINGECTOMY    . LAPAROSCOPIC VAGINAL HYSTERECTOMY WITH SALPINGO OOPHORECTOMY Bilateral 04/28/2017   Procedure: LAPAROSCOPIC ASSISTED VAGINAL HYSTERECTOMY WITH BILATERAL SALPINGO OOPHORECTOMY with Cystoscopy;  Surgeon: Brayton Mars, MD;  Location: [facility] ORS;  Service: Gynecology;  Laterality: Bilateral;  . OVARIAN CYST REMOVAL    . TONSILLECTOMY AND ADENOIDECTOMY  2003  . TUBAL LIGATION    . UPPER EXTREMITY ANGIOGRAPHY Left 05/19/2017   Procedure: UPPER EXTREMITY ANGIOGRAPHY;  Surgeon: Algernon Huxley, MD;  Location: [facility];  Service: Cardiovascular;  Laterality: Left;    Current Outpatient Medications:  .  ALPRAZolam (XANAX) 0.5 MG tablet, Take 1 tablet (0.5 mg total) by mouth daily as needed for anxiety (or panic attacks)., Disp: 30 tablet, Rfl: 0 .  blood glucose meter kit and supplies, Dispense based on patient and insurance preference. Use to check blood sugars twice daily., Disp: 1 each, Rfl: 0 .  esomeprazole (NEXIUM) 20 MG capsule, Take 20 mg by mouth every other day. , Disp: , Rfl:  .  glucose blood test strip, Use to check blood sugars twice daily., Disp: 100 each, Rfl:  5 .  hydrochlorothiazide (MICROZIDE) 12.5 MG capsule, TAKE 1 CAPSULE(12.5 MG) BY MOUTH DAILY, Disp: 30 capsule, Rfl: 1 .  levothyroxine (SYNTHROID) 75 MCG tablet, TAKE 1 TABLET(75 MCG) BY MOUTH DAILY, Disp: 90 tablet, Rfl: 1 .  losartan-hydrochlorothiazide (HYZAAR) 50-12.5 MG tablet, Take 1 tablet by mouth daily., Disp: 90 tablet, Rfl: 3 .  metFORMIN (GLUCOPHAGE) 500 MG tablet, TAKE 1 TABLET(500 MG) BY MOUTH TWICE DAILY WITH A MEAL, Disp: 180 tablet, Rfl: 1 .  metoprolol succinate (TOPROL-XL) 25 MG 24 hr tablet, TAKE 1 TABLET(25 MG) BY  MOUTH DAILY, Disp: 90 tablet, Rfl: 1 .  rosuvastatin (CRESTOR) 10 MG tablet, TAKE 1 TABLET(10 MG) BY MOUTH DAILY, Disp: 90 tablet, Rfl: 3 .  sertraline (ZOLOFT) 50 MG tablet, TAKE 1 AND 1/2 TABLETS BY MOUTH DAILY, Disp: 45 tablet, Rfl: 5 .  meloxicam (MOBIC) 15 MG tablet, Take 1 tablet (15 mg total) by mouth daily. (Patient not taking: Reported on 01/25/2020), Disp: 30 tablet, Rfl: 1 .  Sod Picosulfate-Mag Ox-Cit Acd (CLENPIQ) 10-3.5-12 MG-GM -GM/160ML SOLN, Take 320 mLs by mouth once for 1 dose., Disp: 320 mL, Rfl: 0   Family History  Problem Relation Age of Onset  . Hypertension Mother   . Diabetes Mother   . Uterine cancer Mother   . Hyperlipidemia Father   . Breast cancer Neg Hx   . Ovarian cancer Neg Hx   . Colon cancer Neg Hx      Social History   Tobacco Use  . Smoking status: Former Smoker    Packs/day: 0.25    Years: 10.00    Pack years: 2.50    Types: Cigarettes    Start date: 05/17/2011  . Smokeless tobacco: Never Used  Vaping Use  . Vaping Use: Never used  Substance Use Topics  . Alcohol use: Yes    Alcohol/week: 3.0 standard drinks    Types: 3 Glasses of wine per week    Comment: occas  . Drug use: No    Allergies as of 01/25/2020  . (No Known Allergies)    Review of Systems:    All systems reviewed and negative except where noted in HPI.   Physical Exam:  BP 126/84 (BP Location: Left Arm, Patient Position: Sitting, Cuff Size: Normal)   Pulse 78   Temp 98.2 F (36.8 C) (Oral)   Ht 5' 3"  (1.6 m)   Wt 211 lb 6 oz (95.9 kg)   LMP 01/20/2017 (Approximate)   BMI 37.44 kg/m  Patient's last menstrual period was 01/20/2017 (approximate).  General:   Alert,  Well-developed, well-nourished, pleasant and cooperative in NAD Head:  Normocephalic and atraumatic. Eyes:  Sclera clear, no icterus.   Conjunctiva pink. Ears:  Normal auditory acuity. Nose:  No deformity, discharge, or lesions. Mouth:  No deformity or lesions,oropharynx pink & moist. Neck:  Supple;  no masses or thyromegaly. Lungs:  Respirations even and unlabored.  Clear throughout to auscultation.   No wheezes, crackles, or rhonchi. No acute distress. Heart:  Regular rate and rhythm; no murmurs, clicks, rubs, or gallops. Abdomen:  Normal bowel sounds. Soft, non-tender and non-distended without masses, hepatosplenomegaly or hernias noted.  No guarding or rebound tenderness.   Rectal: Not performed Msk:  Symmetrical without gross deformities. Good, equal movement & strength bilaterally. Pulses:  Normal pulses noted. Extremities:  No clubbing or edema.  No cyanosis. Neurologic:  Alert and oriented x3;  grossly normal neurologically. Skin:  Intact without significant lesions or rashes. No jaundice. Lymph Nodes:  No  significant cervical adenopathy. Psych:  Alert and cooperative. Normal mood and affect.  Imaging Studies: No abdominal imaging  Assessment and Plan:   Christina Stafford is a 49 y.o. pleasant female with metabolic syndrome, hypothyroidism is seen in consultation for chronic GERD, elevated ALT and ANA levels  Chronic GERD Continue omeprazole 20 mg 1-2 times daily before meals Discussed about antireflux lifestyle, information provided  Elevated ALT and ANA levels: Most likely in the setting of metabolic syndrome Recommend right upper quadrant ultrasound Recommend ultrasound-guided liver biopsy Check ceruloplasmin levels, alpha-1 antitrypsin, celiac panel.  Rest of the secondary liver disease work-up is negative  Colon cancer screening Recommend screening colonoscopy and patient is agreeable  I have discussed alternative options, risks & benefits,  which include, but are not limited to, bleeding, infection, perforation,respiratory complication & drug reaction.  The patient agrees with this plan & written consent will be obtained.     Follow up in 3 months   Cephas Darby, MD

## 2020-01-25 NOTE — Patient Instructions (Addendum)
You RUQ ultrasound is scheduled for 02/02/2020 [facility] mall at 10:45am for a 11:00am scan. Nothing to eat or drink after midnight. Can take regular medication with a little bit of water.   Food Choices for Gastroesophageal Reflux Disease, Adult When you have gastroesophageal reflux disease (GERD), the foods you eat and your eating habits are very important. Choosing the right foods can help ease your discomfort. Think about working with a nutrition specialist (dietitian) to help you make good choices. What are tips for following this plan?  Meals  Choose healthy foods that are low in fat, such as fruits, vegetables, whole grains, low-fat dairy products, and lean meat, fish, and poultry.  Eat small meals often instead of 3 large meals a day. Eat your meals slowly, and in a place where you are relaxed. Avoid bending over or lying down until 2-3 hours after eating.  Avoid eating meals 2-3 hours before bed.  Avoid drinking a lot of liquid with meals.  Cook foods using methods other than frying. Bake, grill, or broil food instead.  Avoid or limit: ? Chocolate. ? Peppermint or spearmint. ? Alcohol. ? Pepper. ? Black and decaffeinated coffee. ? Black and decaffeinated tea. ? Bubbly (carbonated) soft drinks. ? Caffeinated energy drinks and soft drinks.  Limit high-fat foods such as: ? Fatty meat or fried foods. ? Whole milk, cream, butter, or ice cream. ? Nuts and nut butters. ? Pastries, donuts, and sweets made with butter or shortening.  Avoid foods that cause symptoms. These foods may be different for everyone. Common foods that cause symptoms include: ? Tomatoes. ? Oranges, lemons, and limes. ? Peppers. ? Spicy food. ? Onions and garlic. ? Vinegar. Lifestyle  Maintain a healthy weight. Ask your doctor what weight is healthy for you. If you need to lose weight, work with your doctor to do so safely.  Exercise for at least 30 minutes for 5 or more days each week,  or as told by your doctor.  Wear loose-fitting clothes.  Do not smoke. If you need help quitting, ask your doctor.  Sleep with the head of your bed higher than your feet. Use a wedge under the mattress or blocks under the bed frame to raise the head of the bed. Summary  When you have gastroesophageal reflux disease (GERD), food and lifestyle choices are very important in easing your symptoms.  Eat small meals often instead of 3 large meals a day. Eat your meals slowly, and in a place where you are relaxed.  Limit high-fat foods such as fatty meat or fried foods.  Avoid bending over or lying down until 2-3 hours after eating.  Avoid peppermint and spearmint, caffeine, alcohol, and chocolate. This information is not intended to replace advice given to you by your health care provider. Make sure you discuss any questions you have with your health care provider. Document Revised: 06/25/2018 Document Reviewed: 04/09/2016 Elsevier Patient Education  [place].

## 2020-01-27 ENCOUNTER — Telehealth: Payer: Self-pay

## 2020-01-27 LAB — CERULOPLASMIN: Ceruloplasmin: 25.4 mg/dL (ref 19.0–39.0)

## 2020-01-27 LAB — IGA: IgA/Immunoglobulin A, Serum: 175 mg/dL (ref 87–352)

## 2020-01-27 LAB — TISSUE TRANSGLUTAMINASE, IGA: Transglutaminase IgA: <2 U/mL (ref 0–3)

## 2020-01-27 LAB — ALPHA-1-ANTITRYPSIN: A-1 Antitrypsin: 105 mg/dL (ref 101–187)

## 2020-01-27 NOTE — Telephone Encounter (Signed)
Christina Stafford is special procedures called to let me know she has patient scheduled for Liver biopsy on 02/07/2020 [facility] mall [facility] 9:30am for a 10:30am scan. Called and left a message for call back to inform patient. Also sent mychart message

## 2020-01-27 NOTE — Telephone Encounter (Signed)
Patient verbalized understanding of times

## 2020-02-02 ENCOUNTER — Other Ambulatory Visit: Payer: Self-pay

## 2020-02-02 ENCOUNTER — Ambulatory Visit
Admission: RE | Admit: 2020-02-02 | Discharge: 2020-02-02 | Disposition: A | Discharge status: Home / Self Care | Payer: BC Managed Care – PPO | Source: Ambulatory Visit | Attending: Gastroenterology | Admitting: Gastroenterology

## 2020-02-02 DIAGNOSIS — R7401 Elevation of levels of liver transaminase levels: Secondary | ICD-10-CM | POA: Diagnosis not present

## 2020-02-02 DIAGNOSIS — R768 Other specified abnormal immunological findings in serum: Secondary | ICD-10-CM | POA: Insufficient documentation

## 2020-02-04 ENCOUNTER — Other Ambulatory Visit: Payer: Self-pay | Admitting: Radiology

## 2020-02-07 ENCOUNTER — Ambulatory Visit
Admission: RE | Admit: 2020-02-07 | Discharge: 2020-02-07 | Disposition: A | Discharge status: Home / Self Care | Payer: BC Managed Care – PPO | Source: Ambulatory Visit | Attending: Gastroenterology | Admitting: Gastroenterology

## 2020-02-07 ENCOUNTER — Other Ambulatory Visit: Payer: Self-pay

## 2020-02-07 DIAGNOSIS — R768 Other specified abnormal immunological findings in serum: Secondary | ICD-10-CM | POA: Insufficient documentation

## 2020-02-07 DIAGNOSIS — Z87891 Personal history of nicotine dependence: Secondary | ICD-10-CM | POA: Diagnosis not present

## 2020-02-07 DIAGNOSIS — E8881 Metabolic syndrome: Secondary | ICD-10-CM | POA: Insufficient documentation

## 2020-02-07 DIAGNOSIS — Z79899 Other long term (current) drug therapy: Secondary | ICD-10-CM | POA: Diagnosis not present

## 2020-02-07 DIAGNOSIS — K219 Gastro-esophageal reflux disease without esophagitis: Secondary | ICD-10-CM | POA: Insufficient documentation

## 2020-02-07 DIAGNOSIS — K7581 Nonalcoholic steatohepatitis (NASH): Secondary | ICD-10-CM | POA: Insufficient documentation

## 2020-02-07 DIAGNOSIS — R7401 Elevation of levels of liver transaminase levels: Secondary | ICD-10-CM | POA: Diagnosis not present

## 2020-02-07 DIAGNOSIS — R7989 Other specified abnormal findings of blood chemistry: Secondary | ICD-10-CM | POA: Insufficient documentation

## 2020-02-07 DIAGNOSIS — E039 Hypothyroidism, unspecified: Secondary | ICD-10-CM | POA: Insufficient documentation

## 2020-02-07 LAB — PROTIME-INR
INR: 0.9 (ref 0.8–1.2)
Prothrombin Time: 12.1 seconds (ref 11.4–15.2)

## 2020-02-07 LAB — CBC
HCT: 39 % (ref 36.0–46.0)
Hemoglobin: 13.2 g/dL (ref 12.0–15.0)
MCH: 30.1 pg (ref 26.0–34.0)
MCHC: 33.8 g/dL (ref 30.0–36.0)
MCV: 88.8 fL (ref 80.0–100.0)
Platelets: 289 10*3/uL (ref 150–400)
RBC: 4.39 MIL/uL (ref 3.87–5.11)
RDW: 14 % (ref 11.5–15.5)
WBC: 5.9 10*3/uL (ref 4.0–10.5)
nRBC: 0 % (ref 0.0–0.2)

## 2020-02-07 LAB — GLUCOSE, CAPILLARY: Glucose-Capillary: 135 mg/dL — ABNORMAL HIGH (ref 70–99)

## 2020-02-07 MED ORDER — MIDAZOLAM HCL 2 MG/2ML IJ SOLN
INTRAMUSCULAR | Status: AC
  Filled 2020-02-07: qty 2

## 2020-02-07 MED ORDER — HYDROCODONE-ACETAMINOPHEN 5-325 MG PO TABS
ORAL_TABLET | ORAL | Status: AC
  Administered 2020-02-07: 1 via ORAL
  Filled 2020-02-07: qty 1

## 2020-02-07 MED ORDER — FENTANYL CITRATE (PF) 100 MCG/2ML IJ SOLN
INTRAMUSCULAR | Status: AC
  Filled 2020-02-07: qty 2

## 2020-02-07 MED ORDER — FENTANYL CITRATE (PF) 100 MCG/2ML IJ SOLN
INTRAMUSCULAR | Status: AC | PRN
Start: 2020-02-07 — End: 2020-02-07
  Administered 2020-02-07 (×2): 50 ug via INTRAVENOUS

## 2020-02-07 MED ORDER — HYDROCODONE-ACETAMINOPHEN 5-325 MG PO TABS: 1.0000 | ORAL_TABLET | Freq: Once | ORAL | Status: AC

## 2020-02-07 MED ORDER — SODIUM CHLORIDE 0.9 % IV SOLN: INTRAVENOUS | Status: DC

## 2020-02-07 MED ORDER — MIDAZOLAM HCL 2 MG/2ML IJ SOLN
INTRAMUSCULAR | Status: AC | PRN
  Administered 2020-02-07 (×2): 1 mg via INTRAVENOUS

## 2020-02-07 NOTE — Discharge Instructions (Signed)
Liver Biopsy, Care After These instructions give you information about how to care for yourself after your procedure. Your health care provider may also give you more specific instructions. If you have problems or questions, contact your health care provider. What can I expect after the procedure? After your procedure, it is common to have:  Pain and soreness in the area where the biopsy was done.  Bruising around the area where the biopsy was done.  Sleepiness and fatigue for 1-2 days. Follow these instructions at home: Medicines  Take over-the-counter and prescription medicines only as told by your health care provider.  If you were prescribed an antibiotic medicine, take it as told by your health care provider. Do not stop taking the antibiotic even if you start to feel better.  Do not take medicines such as aspirin and ibuprofen unless your health care provider tells you to take them. These medicines thin your blood and can increase the risk of bleeding.  If you are taking prescription pain medicine, take actions to prevent or treat constipation. Your health care provider may recommend that you: ? Drink enough fluid to keep your urine pale yellow. ? Eat foods that are high in fiber, such as fresh fruits and vegetables, whole grains, and beans. ? Limit foods that are high in fat and processed sugars, such as fried or sweet foods. ? Take an over-the-counter or prescription medicine for constipation. Incision care  Follow instructions from your health care provider about how to take care of your incision. Make sure you: ? Wash your hands with soap and water before you change your bandage (dressing). If soap and water are not available, use hand sanitizer. ? Change your dressing as told by your health care provider. ? Leave stitches (sutures), skin glue, or adhesive strips in place. These skin closures may need to stay in place for 2 weeks or longer. If adhesive strip edges start to  loosen and curl up, you may trim the loose edges. Do not remove adhesive strips completely unless your health care provider tells you to do that.  Check your incision area every day for signs of infection. Check for: ? Redness, swelling, or pain. ? Fluid or blood. ? Warmth. ? Pus or a bad smell.  Do not take baths, swim, or use a hot tub until your health care provider says it is okay to do so. Activity   Rest at home for 1-2 days, or as directed by your health care provider. ? Avoid sitting for a long time without moving. Get up to take short walks every 1-2 hours. This is important to improve blood flow and breathing. Ask for help if you feel weak or unsteady.  Return to your normal activities as told by your health care provider. Ask your health care provider what activities are safe for you.  Do not drive or use heavy machinery while taking prescription pain medicine.  Do not lift anything that is heavier than 10 lb (4.5 kg), or the limit that your health care provider tells you, until he or she says that it is safe.  Do not play contact sports for 2 weeks after the procedure. General instructions   Do not drink alcohol in the first week after the procedure.  Have someone stay with you for at least 24 hours after the procedure.  It is your responsibility to obtain your test results. Ask your health care provider, or the department that is doing the test: ? When will my  results be ready? ? How will I get my results? ? What are my treatment options? ? What other tests do I need? ? What are my next steps?  Keep all follow-up visits as told by your health care provider. This is important. Contact a health care provider if:  You have increased bleeding from an incision, resulting in more than a small spot of blood.  You have redness, swelling, or increasing pain in any incisions.  You notice a discharge or a bad smell coming from any of your incisions.  You have a fever or  chills. Get help right away if:  You develop swelling, bloating, or pain in your abdomen.  You become dizzy or faint.  You develop a rash.  You have nausea or you vomit.  You faint, or you have shortness of breath or difficulty breathing.  You develop chest pain.  You have problems with your speech or vision.  You have trouble with your balance or moving your arms or legs. Summary  After the liver biopsy, it is common to have pain, soreness, and bruising in the area, as well as sleepiness and fatigue.  Take over-the-counter and prescription medicines only as told by your health care provider.  Follow instructions from your health care provider about how to care for your incision. Check the incision area daily for signs of infection. This information is not intended to replace advice given to you by your health care provider. Make sure you discuss any questions you have with your health care provider. Document Revised: 04/27/2018 Document Reviewed: 03/14/2017 Elsevier Patient Education  2020 [employer].

## 2020-02-07 NOTE — Procedures (Signed)
Pre Procedure Dx: Elevated LFTs of uncertain etiology Post Procedural Dx: Same  Technically successful US guided biopsy of right lobe of the liver.  EBL: None No immediate complications.   Ronny Bacon, MD Pager #: [phone]

## 2020-02-07 NOTE — H&P (Signed)
Chief Complaint: Elevated liver enzymes with increased liver parenchymal. Request is for liver biopsy.  Referring Physician(s): Lin Landsman  Supervising Physician: Sandi Mariscal  Patient Status: [facility] - Out-pt  History of Present Illness: Christina Stafford is a 49 y.o. female  History of metabolic syndrome, hypothyroidism, GERD. Found to have elevated ALT  since April 2020 and ANA. Team is requesting a liver biopsy for further evaluation. US abdomen from 11.17.21 reads Diffusely increased liver parenchymal echogenicity which is anonspecific finding but most commonly seen with hepatic steatosis.  Patient states that she is anxious about the procedure. Return precautions and treatment recommendations and follow-up discussed with the patient who is agreeable with the plan.   Past Medical History:  Diagnosis Date  . Carpal tunnel syndrome on both sides   . Complication of anesthesia   . Depression   . Family history of adverse reaction to anesthesia    N/V  . GERD (gastroesophageal reflux disease)   . Heart murmur    DURING PREGNANCY  . Hyperlipidemia   . Hypertension   . Obesity   . PONV (postoperative nausea and vomiting)   . Sleep apnea 2011   using CPAP 6 cm H20  [facility] Sleep Study  . Vertigo     Past Surgical History:  Procedure Laterality Date  . BREAST SURGERY  2003   negative biopsy  . CHOLECYSTECTOMY  1995  . HYSTERECTOMY ABDOMINAL WITH SALPINGECTOMY    . LAPAROSCOPIC VAGINAL HYSTERECTOMY WITH SALPINGO OOPHORECTOMY Bilateral 04/28/2017   Procedure: LAPAROSCOPIC ASSISTED VAGINAL HYSTERECTOMY WITH BILATERAL SALPINGO OOPHORECTOMY with Cystoscopy;  Surgeon: Brayton Mars, MD;  Location: [facility] ORS;  Service: Gynecology;  Laterality: Bilateral;  . OVARIAN CYST REMOVAL    . TONSILLECTOMY AND ADENOIDECTOMY  2003  . TUBAL LIGATION    . UPPER EXTREMITY ANGIOGRAPHY Left 05/19/2017   Procedure: UPPER EXTREMITY ANGIOGRAPHY;  Surgeon: Algernon Huxley, MD;   Location: [facility];  Service: Cardiovascular;  Laterality: Left;    Allergies: Patient has no known allergies.  Medications: Prior to Admission medications   Medication Sig Start Date End Date Taking? Authorizing Provider  ALPRAZolam Duanne Moron) 0.5 MG tablet Take 1 tablet (0.5 mg total) by mouth daily as needed for anxiety (or panic attacks). 11/11/19  Yes Crecencio Mc, MD  esomeprazole (NEXIUM) 20 MG capsule Take 20 mg by mouth every other day.    Yes [provider]  hydrochlorothiazide (MICROZIDE) 12.5 MG capsule TAKE 1 CAPSULE(12.5 MG) BY MOUTH DAILY 11/02/19  Yes Crecencio Mc, MD  levothyroxine (SYNTHROID) 75 MCG tablet TAKE 1 TABLET(75 MCG) BY MOUTH DAILY 01/25/20  Yes Crecencio Mc, MD  losartan-hydrochlorothiazide (HYZAAR) 50-12.5 MG tablet Take 1 tablet by mouth daily. 07/27/19  Yes Crecencio Mc, MD  meloxicam (MOBIC) 15 MG tablet Take 1 tablet (15 mg total) by mouth daily. 01/18/20  Yes Edrick Kins, DPM  metFORMIN (GLUCOPHAGE) 500 MG tablet TAKE 1 TABLET(500 MG) BY MOUTH TWICE DAILY WITH A MEAL 01/25/20  Yes Crecencio Mc, MD  metoprolol succinate (TOPROL-XL) 25 MG 24 hr tablet TAKE 1 TABLET(25 MG) BY MOUTH DAILY 09/07/19  Yes Crecencio Mc, MD  rosuvastatin (CRESTOR) 10 MG tablet TAKE 1 TABLET(10 MG) BY MOUTH DAILY 01/03/20  Yes Crecencio Mc, MD  sertraline (ZOLOFT) 50 MG tablet TAKE 1 AND 1/2 TABLETS BY MOUTH DAILY 11/04/19  Yes Crecencio Mc, MD  blood glucose meter kit and supplies Dispense based on patient and insurance preference. Use to  check blood sugars twice daily. 07/29/19   Crecencio Mc, MD  glucose blood test strip Use to check blood sugars twice daily. 09/29/19   Crecencio Mc, MD     Family History  Problem Relation Age of Onset  . Hypertension Mother   . Diabetes Mother   . Uterine cancer Mother   . Hyperlipidemia Father   . Breast cancer Neg Hx   . Ovarian cancer Neg Hx   . Colon cancer Neg Hx     Social History    Socioeconomic History  . Marital status: Married    Spouse name: Not on file  . Number of children: Not on file  . Years of education: Not on file  . Highest education level: Not on file  Occupational History  . Not on file  Tobacco Use  . Smoking status: Former Smoker    Packs/day: 0.25    Years: 10.00    Pack years: 2.50    Types: Cigarettes    Start date: 05/17/2011  . Smokeless tobacco: Never Used  Vaping Use  . Vaping Use: Never used  Substance and Sexual Activity  . Alcohol use: Yes    Alcohol/week: 3.0 standard drinks    Types: 3 Glasses of wine per week    Comment: occas  . Drug use: No  . Sexual activity: Yes    Birth control/protection: Surgical  Other Topics Concern  . Not on file  Social History Narrative  . Not on file   Social Determinants of Health   Financial Resource Strain:   . Difficulty of Paying Living Expenses: Not on file  Food Insecurity:   . Worried About Charity fundraiser in the Last Year: Not on file  . Ran Out of Food in the Last Year: Not on file  Transportation Needs:   . Lack of Transportation (Medical): Not on file  . Lack of Transportation (Non-Medical): Not on file  Physical Activity:   . Days of Exercise per Week: Not on file  . Minutes of Exercise per Session: Not on file  Stress:   . Feeling of Stress : Not on file  Social Connections:   . Frequency of Communication with Friends and Family: Not on file  . Frequency of Social Gatherings with Friends and Family: Not on file  . Attends Religious Services: Not on file  . Active Member of Clubs or Organizations: Not on file  . Attends Archivist Meetings: Not on file  . Marital Status: Not on file     Review of Systems: A 12 point ROS discussed and pertinent positives are indicated in the HPI above.  All other systems are negative.  Review of Systems  Constitutional: Negative for fatigue and fever.  HENT: Negative for congestion.   Respiratory: Negative for  cough and shortness of breath.   Gastrointestinal: Negative for abdominal pain, diarrhea, nausea and vomiting.    Vital Signs: BP (!) 147/99   Pulse 70   Resp 17   LMP 01/20/2017 (Approximate)   SpO2 98%   Physical Exam Vitals and nursing note reviewed.  Constitutional:      Appearance: She is well-developed.  HENT:     Head: Normocephalic and atraumatic.  Eyes:     Conjunctiva/sclera: Conjunctivae normal.  Cardiovascular:     Rate and Rhythm: Normal rate and regular rhythm.     Heart sounds: Normal heart sounds.  Pulmonary:     Effort: Pulmonary effort is normal.  Breath sounds: Normal breath sounds.  Musculoskeletal:     Cervical back: Normal range of motion.  Neurological:     Mental Status: She is alert and oriented to person, place, and time.     Imaging: DG Foot Complete Left  Result Date: 01/18/2020 Please see detailed radiograph report in office note.  US Abdomen Limited RUQ (LIVER/GB)  Result Date: 02/03/2020 CLINICAL DATA:  Abnormal liver function tests EXAM: ULTRASOUND ABDOMEN LIMITED RIGHT UPPER QUADRANT COMPARISON:  CT abdomen pelvis 08/27/2006 FINDINGS: Gallbladder: Surgically absent. Common bile duct: Diameter: 0.3 cm, within normal limits. Liver: No focal lesion identified. Diffusely increased liver parenchymal echogenicity. Portal vein is patent on color Doppler imaging with normal direction of blood flow towards the liver. Other: None. IMPRESSION: Diffusely increased liver parenchymal echogenicity which is a nonspecific finding but most commonly seen with hepatic steatosis. Electronically Signed   By: Audie Pinto M.D.   On: 02/03/2020 08:09    Labs:  CBC: Recent Labs    07/23/19 0836  WBC 7.5  HGB 13.9  HCT 40.0  PLT 297.0    COAGS: No results for input(s): INR, APTT in the last 8760 hours.  BMP: Recent Labs    07/23/19 0836 11/08/19 0827  NA 138 140  K 4.0 4.1  CL 99 102  CO2 29 24  GLUCOSE 171* 134*  BUN 14 14  CALCIUM  10.2 10.0  CREATININE 0.76 0.73    LIVER FUNCTION TESTS: Recent Labs    07/23/19 0836 11/08/19 0827  BILITOT 0.4 0.4  AST 35 30  ALT 47* 40*  ALKPHOS 79 81  PROT 7.5 7.3  ALBUMIN 4.6 4.6    Assessment and Plan:  49 y.o. female outpatient. History of metabolic syndrome, hypothyroidism, GERD. Found to have elevated ALT  since April 2020 and ANA. Team is requesting a liver biopsy for further evaluation. US abdomen from 11.17.21 reads Diffusely increased liver parenchymal echogenicity which is anonspecific finding but most commonly seen with hepatic steatosis. All labs and medications are within acceptable parameters. NKDA. Patient has been NPO since midnight.   Risks and benefits of liver biopsy was discussed with the patient and/or patient's family including, but not limited to bleeding, infection, damage to adjacent structures or low yield requiring additional tests.  All of the questions were answered and there is agreement to proceed.  Consent signed and in chart.    Thank you for this interesting consult.  I greatly enjoyed meeting MARCEE JACOBS and look forward to participating in their care.  A copy of this report was sent to the requesting provider on this date.  Electronically Signed: Jacqualine Mau, NP 02/07/2020, 10:12 AM   I spent a total of  30 Minutes   in face to face in clinical consultation, greater than 50% of which was counseling/coordinating care for liver biopsy

## 2020-02-07 NOTE — Progress Notes (Signed)
Patient clinically stable post liver biopsy, tolerated well. Vitals stable pre and post procedure. Denies complaints immediately post procedure.received Versed 2 mg along with Fentanyl 100 mcg Iv for procedure.stated post procedure in recovery 3/10 right shoulder aching. Orders for Norco placed if needed. Report given to Carlynn Spry Rn in recovery.

## 2020-02-08 LAB — SURGICAL PATHOLOGY

## 2020-02-09 ENCOUNTER — Other Ambulatory Visit: Payer: Self-pay

## 2020-02-09 ENCOUNTER — Other Ambulatory Visit (INDEPENDENT_AMBULATORY_CARE_PROVIDER_SITE_OTHER): Payer: BC Managed Care – PPO

## 2020-02-09 DIAGNOSIS — E039 Hypothyroidism, unspecified: Secondary | ICD-10-CM

## 2020-02-09 DIAGNOSIS — E1169 Type 2 diabetes mellitus with other specified complication: Secondary | ICD-10-CM | POA: Diagnosis not present

## 2020-02-09 DIAGNOSIS — E782 Mixed hyperlipidemia: Secondary | ICD-10-CM

## 2020-02-09 DIAGNOSIS — I10 Essential (primary) hypertension: Secondary | ICD-10-CM

## 2020-02-09 LAB — COMPREHENSIVE METABOLIC PANEL
ALT: 37 U/L — ABNORMAL HIGH (ref 0–35)
AST: 23 U/L (ref 0–37)
Albumin: 4.4 g/dL (ref 3.5–5.2)
Alkaline Phosphatase: 74 U/L (ref 39–117)
BUN: 12 mg/dL (ref 6–23)
CO2: 28 mEq/L (ref 19–32)
Calcium: 9.8 mg/dL (ref 8.4–10.5)
Chloride: 102 mEq/L (ref 96–112)
Creatinine, Ser: 0.76 mg/dL (ref 0.40–1.20)
GFR: 90.67 mL/min (ref >60.00)
Glucose, Bld: 129 mg/dL — ABNORMAL HIGH (ref 70–99)
Potassium: 3.8 mEq/L (ref 3.5–5.1)
Sodium: 139 mEq/L (ref 135–145)
Total Bilirubin: 0.3 mg/dL (ref 0.2–1.2)
Total Protein: 7.4 g/dL (ref 6.0–8.3)

## 2020-02-09 LAB — BASIC METABOLIC PANEL
BUN: 12 mg/dL (ref 6–23)
CO2: 28 mEq/L (ref 19–32)
Calcium: 9.8 mg/dL (ref 8.4–10.5)
Chloride: 102 mEq/L (ref 96–112)
Creatinine, Ser: 0.76 mg/dL (ref 0.40–1.20)
GFR: 90.67 mL/min (ref >60.00)
Glucose, Bld: 129 mg/dL — ABNORMAL HIGH (ref 70–99)
Potassium: 3.8 mEq/L (ref 3.5–5.1)
Sodium: 139 mEq/L (ref 135–145)

## 2020-02-09 LAB — LDL CHOLESTEROL, DIRECT: Direct LDL: 182 mg/dL

## 2020-02-09 LAB — LIPID PANEL
Cholesterol: 262 mg/dL — ABNORMAL HIGH (ref 0–200)
HDL: 52.1 mg/dL (ref >39.00)
NonHDL: 210.39
Total CHOL/HDL Ratio: 5
Triglycerides: 259 mg/dL — ABNORMAL HIGH (ref 0.0–149.0)
VLDL: 51.8 mg/dL — ABNORMAL HIGH (ref 0.0–40.0)

## 2020-02-09 LAB — HEMOGLOBIN A1C: Hgb A1c MFr Bld: 6.7 % — ABNORMAL HIGH (ref 4.6–6.5)

## 2020-02-09 LAB — TSH: TSH: 5.82 u[IU]/mL — ABNORMAL HIGH (ref 0.35–4.50)

## 2020-02-10 MED ORDER — LEVOTHYROXINE SODIUM 88 MCG PO TABS
88.0000 ug | ORAL_TABLET | Freq: Every day | ORAL | 1 refills | Status: DC
Start: 2020-02-10 — End: 2020-04-24

## 2020-02-10 NOTE — Addendum Note (Signed)
Addended by: Crecencio Mc on: 02/10/2020 02:28 PM   Modules accepted: Orders

## 2020-02-10 NOTE — Progress Notes (Signed)
Your A1c has improved to 6.7  and your liver enzyme is lower.   Good job!  However, your Cholesterol is actually higher.  Have you been taking the rosuvastatin? Let me know so we can discuss what to do.   Your thyroid function is underactive. I will increase your levothyroxine dose to 88 mcg daily .  We can  repeat  The TSH in [redacted] weeks along with the cholesterol following a medication start or dose adjustment .  Regards,   Deborra Medina, MD

## 2020-02-10 NOTE — Assessment & Plan Note (Signed)
Increasing dose to 88 mcg based on elevated TSH  Lab Results  Component Value Date   TSH 5.82 (H) 02/09/2020

## 2020-02-11 ENCOUNTER — Other Ambulatory Visit
Admission: RE | Admit: 2020-02-11 | Discharge: 2020-02-11 | Disposition: A | Discharge status: Home / Self Care | Payer: BC Managed Care – PPO | Source: Ambulatory Visit | Attending: Gastroenterology | Admitting: Gastroenterology

## 2020-02-11 ENCOUNTER — Other Ambulatory Visit: Payer: Self-pay

## 2020-02-11 DIAGNOSIS — Z01812 Encounter for preprocedural laboratory examination: Secondary | ICD-10-CM | POA: Diagnosis not present

## 2020-02-11 DIAGNOSIS — Z20822 Contact with and (suspected) exposure to covid-19: Secondary | ICD-10-CM | POA: Diagnosis not present

## 2020-02-12 LAB — SARS CORONAVIRUS 2 (TAT 6-24 HRS): SARS Coronavirus 2: NEGATIVE

## 2020-02-14 ENCOUNTER — Telehealth: Payer: Self-pay

## 2020-02-14 NOTE — Telephone Encounter (Signed)
Patient can not do the colonoscopy that is scheduled for 02/15/20. She wants to be moved to 02/28/2020. Called Trish and moved patient to 02/28/2020. Informed patient COVID test would be on 02/24/2020. Updated the referral

## 2020-02-17 ENCOUNTER — Telehealth: Payer: Self-pay

## 2020-02-17 NOTE — Telephone Encounter (Signed)
Patient is calling wanting to know the result of her liver biopsy that she had done on 02/07/2020. Please advised

## 2020-02-21 ENCOUNTER — Encounter: Payer: Self-pay | Admitting: Gastroenterology

## 2020-02-21 ENCOUNTER — Other Ambulatory Visit: Payer: Self-pay | Admitting: Internal Medicine

## 2020-02-21 MED ORDER — HYDROCHLOROTHIAZIDE 12.5 MG PO CAPS
ORAL_CAPSULE | ORAL | 1 refills | Status: DC
Start: 2020-02-21 — End: 2020-09-11

## 2020-02-21 NOTE — Telephone Encounter (Signed)
Sent her message in my chart regarding the test results  RV

## 2020-02-21 NOTE — Telephone Encounter (Signed)
Patient read my chart message

## 2020-02-24 ENCOUNTER — Other Ambulatory Visit: Payer: Self-pay

## 2020-02-24 ENCOUNTER — Other Ambulatory Visit
Admission: RE | Admit: 2020-02-24 | Discharge: 2020-02-24 | Disposition: A | Discharge status: Home / Self Care | Payer: BC Managed Care – PPO | Source: Ambulatory Visit | Attending: Gastroenterology | Admitting: Gastroenterology

## 2020-02-24 DIAGNOSIS — Z01812 Encounter for preprocedural laboratory examination: Secondary | ICD-10-CM | POA: Diagnosis not present

## 2020-02-24 DIAGNOSIS — Z20822 Contact with and (suspected) exposure to covid-19: Secondary | ICD-10-CM | POA: Diagnosis not present

## 2020-02-25 ENCOUNTER — Encounter: Payer: Self-pay | Admitting: Gastroenterology

## 2020-02-25 ENCOUNTER — Other Ambulatory Visit: Payer: BC Managed Care – PPO

## 2020-02-25 LAB — SARS CORONAVIRUS 2 (TAT 6-24 HRS): SARS Coronavirus 2: NEGATIVE

## 2020-02-28 ENCOUNTER — Ambulatory Visit: Payer: BC Managed Care – PPO | Admitting: Certified Registered Nurse Anesthetist

## 2020-02-28 ENCOUNTER — Ambulatory Visit
Admission: RE | Admit: 2020-02-28 | Discharge: 2020-02-28 | Disposition: A | Discharge status: Home / Self Care | Payer: BC Managed Care – PPO | Attending: Gastroenterology | Admitting: Gastroenterology

## 2020-02-28 ENCOUNTER — Encounter
Admission: RE | Disposition: A | Discharge status: Home / Self Care | Payer: Self-pay | Source: Home / Self Care | Attending: Gastroenterology

## 2020-02-28 ENCOUNTER — Encounter: Payer: Self-pay | Admitting: Gastroenterology

## 2020-02-28 ENCOUNTER — Other Ambulatory Visit: Payer: Self-pay

## 2020-02-28 DIAGNOSIS — Z1211 Encounter for screening for malignant neoplasm of colon: Secondary | ICD-10-CM | POA: Diagnosis not present

## 2020-02-28 DIAGNOSIS — Z87891 Personal history of nicotine dependence: Secondary | ICD-10-CM | POA: Diagnosis not present

## 2020-02-28 DIAGNOSIS — Z7984 Long term (current) use of oral hypoglycemic drugs: Secondary | ICD-10-CM | POA: Insufficient documentation

## 2020-02-28 DIAGNOSIS — Z79899 Other long term (current) drug therapy: Secondary | ICD-10-CM | POA: Diagnosis not present

## 2020-02-28 DIAGNOSIS — Z90722 Acquired absence of ovaries, bilateral: Secondary | ICD-10-CM | POA: Insufficient documentation

## 2020-02-28 DIAGNOSIS — Z9079 Acquired absence of other genital organ(s): Secondary | ICD-10-CM | POA: Insufficient documentation

## 2020-02-28 DIAGNOSIS — Z79890 Hormone replacement therapy (postmenopausal): Secondary | ICD-10-CM | POA: Insufficient documentation

## 2020-02-28 DIAGNOSIS — Z791 Long term (current) use of non-steroidal anti-inflammatories (NSAID): Secondary | ICD-10-CM | POA: Insufficient documentation

## 2020-02-28 DIAGNOSIS — Z9049 Acquired absence of other specified parts of digestive tract: Secondary | ICD-10-CM | POA: Insufficient documentation

## 2020-02-28 HISTORY — PX: COLONOSCOPY WITH PROPOFOL: SHX5780

## 2020-02-28 HISTORY — DX: Type 2 diabetes mellitus without complications: E11.9

## 2020-02-28 SURGERY — COLONOSCOPY WITH PROPOFOL
Anesthesia: General

## 2020-02-28 MED ORDER — SODIUM CHLORIDE 0.9 % IV SOLN
INTRAVENOUS | Status: DC
  Administered 2020-02-28: 1000 mL via INTRAVENOUS

## 2020-02-28 MED ORDER — LIDOCAINE HCL (CARDIAC) PF 100 MG/5ML IV SOSY
PREFILLED_SYRINGE | INTRAVENOUS | Status: DC | PRN
  Administered 2020-02-28: 80 mg via INTRAVENOUS

## 2020-02-28 MED ORDER — ONDANSETRON HCL 4 MG/2ML IJ SOLN
INTRAMUSCULAR | Status: DC | PRN
  Administered 2020-02-28: 4 mg via INTRAVENOUS

## 2020-02-28 MED ORDER — PROPOFOL 500 MG/50ML IV EMUL
INTRAVENOUS | Status: DC | PRN
  Administered 2020-02-28: 140 ug/kg/min via INTRAVENOUS

## 2020-02-28 MED ORDER — PROPOFOL 10 MG/ML IV BOLUS
INTRAVENOUS | Status: DC | PRN
  Administered 2020-02-28: 50 mg via INTRAVENOUS
  Administered 2020-02-28: 20 mg via INTRAVENOUS
  Administered 2020-02-28: 40 mg via INTRAVENOUS
  Administered 2020-02-28: 10 mg via INTRAVENOUS

## 2020-02-28 MED ORDER — GLYCOPYRROLATE 0.2 MG/ML IJ SOLN
INTRAMUSCULAR | Status: DC | PRN
  Administered 2020-02-28 (×2): .1 mg via INTRAVENOUS

## 2020-02-28 NOTE — Anesthesia Procedure Notes (Signed)
Procedure Name: MAC Date/Time: 02/28/2020 8:41 AM Performed by: Lily Peer, Josphine Laffey, CRNA Pre-anesthesia Checklist: Patient identified, Emergency Drugs available, Suction available, Patient being monitored and Timeout performed Patient Re-evaluated:Patient Re-evaluated prior to induction Oxygen Delivery Method: Simple face mask Induction Type: IV induction

## 2020-02-28 NOTE — Op Note (Signed)
[facility] Patient Name: Christina Stafford Procedure Date: 02/28/2020 8:31 AM MRN: [id number] Account #: [id number] Date of Birth: [birth year] Admit Type: Outpatient Age: 49 Room: [facility] ENDO ROOM 1 Gender: Female Note Status: Finalized Procedure:             Colonoscopy Indications:           Screening for colorectal malignant neoplasm, This is                         the patient's first colonoscopy Providers:             Lin Landsman MD, MD Medicines:             General Anesthesia Complications:         No immediate complications. Estimated blood loss: None. Procedure:             Pre-Anesthesia Assessment:                        - Prior to the procedure, a History and Physical was                         performed, and patient medications and allergies were                         reviewed. The patient is competent. The risks and                         benefits of the procedure and the sedation options and                         risks were discussed with the patient. All questions                         were answered and informed consent was obtained.                         Patient identification and proposed procedure were                         verified by the physician, the nurse, the                         anesthesiologist, the anesthetist and the technician                         in the pre-procedure area in the procedure room in the                         endoscopy suite. Mental Status Examination: alert and                         oriented. Airway Examination: normal oropharyngeal                         airway and neck mobility. Respiratory Examination:                         clear to auscultation. CV Examination: normal.  Prophylactic Antibiotics: The patient does not require                         prophylactic antibiotics. Prior Anticoagulants: The                         patient has taken no  previous anticoagulant or                         antiplatelet agents. ASA Grade Assessment: II - A                         patient with mild systemic disease. After reviewing                         the risks and benefits, the patient was deemed in                         satisfactory condition to undergo the procedure. The                         anesthesia plan was to use general anesthesia.                         Immediately prior to administration of medications,                         the patient was re-assessed for adequacy to receive                         sedatives. The heart rate, respiratory rate, oxygen                         saturations, blood pressure, adequacy of pulmonary                         ventilation, and response to care were monitored                         throughout the procedure. The physical status of the                         patient was re-assessed after the procedure.                        After obtaining informed consent, the colonoscope was                         passed under direct vision. Throughout the procedure,                         the patient's blood pressure, pulse, and oxygen                         saturations were monitored continuously. The                         Colonoscope was introduced through the anus and  advanced to the the cecum, identified by appendiceal                         orifice and ileocecal valve. The colonoscopy was                         performed without difficulty. The patient tolerated                         the procedure well. The quality of the bowel                         preparation was evaluated using the BBPS [facility] Bowel                         Preparation Scale) with scores of: Right Colon = 3,                         Transverse Colon = 3 and Left Colon = 3 (entire mucosa                         seen well with no residual staining, small fragments                         of  stool or opaque liquid). The total BBPS score                         equals 9. Findings:      The perianal and digital rectal examinations were normal. Pertinent       negatives include normal sphincter tone and no palpable rectal lesions.      The colon (entire examined portion) appeared normal.      The retroflexed view of the distal rectum and anal verge was normal and       showed no anal or rectal abnormalities. Impression:            - The entire examined colon is normal.                        - The distal rectum and anal verge are normal on                         retroflexion view.                        - No specimens collected. Recommendation:        - Discharge patient to home (with escort).                        - Resume previous diet today.                        - Continue present medications.                        - Repeat colonoscopy in 10 years for screening                         purposes. Procedure Code(s):     ---  Professional ---                        A5790, Colorectal cancer screening; colonoscopy on                         individual not meeting criteria for high risk Diagnosis Code(s):     --- Professional ---                        Z12.11, Encounter for screening for malignant neoplasm                         of colon CPT copyright 2019 American Medical Association. All rights reserved. The codes documented in this report are preliminary and upon coder review may  be revised to meet current compliance requirements. Dr. Ulyess Mort Lin Landsman MD, MD 02/28/2020 8:54:07 AM This report has been signed electronically. Number of Addenda: 0 Note Initiated On: 02/28/2020 8:31 AM Scope Withdrawal Time: 0 hours 12 minutes 53 seconds  Total Procedure Duration: 0 hours 16 minutes 37 seconds  Estimated Blood Loss:  Estimated blood loss: none.      [facility]

## 2020-02-28 NOTE — Transfer of Care (Signed)
Immediate Anesthesia Transfer of Care Note  Patient: Christina Stafford  Procedure(s) Performed: COLONOSCOPY WITH PROPOFOL (N/A )  Patient Location: PACU and Endoscopy Unit  Anesthesia Type:General  Level of Consciousness: awake, alert  and oriented  Airway & Oxygen Therapy: Patient Spontanous Breathing  Post-op Assessment: Report given to RN and Post -op Vital signs reviewed and stable  Post vital signs: Reviewed and stable  Last Vitals:  Vitals Value Taken Time  BP 132/113 02/28/20 0857  Temp 36.2 C 02/28/20 0857  Pulse 65 02/28/20 0858  Resp 11 02/28/20 0858  SpO2 96 % 02/28/20 0858  Vitals shown include unvalidated device data.  Last Pain:  Vitals:   02/28/20 0759  TempSrc: Temporal  PainSc: 0-No pain         Complications: No complications documented.

## 2020-02-28 NOTE — H&P (Signed)
Cephas Darby, MD [address]  [place]  [place], [place] [address]  Main: [phone]  Fax: [phone] Pager: [phone]  Primary Care Physician:  Crecencio Mc, MD Primary Gastroenterologist:  Dr. Cephas Darby  Pre-Procedure History & Physical: HPI:  Christina Stafford is a 49 y.o. female is here for an colonoscopy.   Past Medical History:  Diagnosis Date  . Carpal tunnel syndrome on both sides   . Complication of anesthesia   . Depression   . Diabetes mellitus without complication ([place])   . Family history of adverse reaction to anesthesia    N/V  . GERD (gastroesophageal reflux disease)   . Heart murmur    DURING PREGNANCY  . Hyperlipidemia   . Hypertension   . Obesity   . PONV (postoperative nausea and vomiting)   . Sleep apnea 2011   using CPAP 6 cm H20  [facility] Sleep Study  . Vertigo     Past Surgical History:  Procedure Laterality Date  . ABDOMINAL HYSTERECTOMY    . BREAST SURGERY  2003   negative biopsy  . CHOLECYSTECTOMY  1995  . HYSTERECTOMY ABDOMINAL WITH SALPINGECTOMY    . LAPAROSCOPIC VAGINAL HYSTERECTOMY WITH SALPINGO OOPHORECTOMY Bilateral 04/28/2017   Procedure: LAPAROSCOPIC ASSISTED VAGINAL HYSTERECTOMY WITH BILATERAL SALPINGO OOPHORECTOMY with Cystoscopy;  Surgeon: Brayton Mars, MD;  Location: [facility] ORS;  Service: Gynecology;  Laterality: Bilateral;  . OVARIAN CYST REMOVAL    . TONSILLECTOMY AND ADENOIDECTOMY  2003  . TUBAL LIGATION    . UPPER EXTREMITY ANGIOGRAPHY Left 05/19/2017   Procedure: UPPER EXTREMITY ANGIOGRAPHY;  Surgeon: Algernon Huxley, MD;  Location: [place] [facility];  Service: Cardiovascular;  Laterality: Left;    Prior to Admission medications   Medication Sig Start Date End Date Taking? Authorizing Provider  ALPRAZolam Duanne Moron) 0.5 MG tablet Take 1 tablet (0.5 mg total) by mouth daily as needed for anxiety (or panic attacks). 11/11/19  Yes Crecencio Mc, MD  blood glucose meter kit and supplies Dispense  based on patient and insurance preference. Use to check blood sugars twice daily. 07/29/19  Yes Crecencio Mc, MD  esomeprazole (NEXIUM) 20 MG capsule Take 20 mg by mouth every other day.    Yes [provider]  glucose blood test strip Use to check blood sugars twice daily. 09/29/19  Yes Crecencio Mc, MD  hydrochlorothiazide (MICROZIDE) 12.5 MG capsule TAKE 1 CAPSULE(12.5 MG) BY MOUTH DAILY 02/21/20  Yes Crecencio Mc, MD  levothyroxine (SYNTHROID) 88 MCG tablet Take 1 tablet (88 mcg total) by mouth daily before breakfast. 02/10/20  Yes Crecencio Mc, MD  losartan-hydrochlorothiazide (HYZAAR) 50-12.5 MG tablet Take 1 tablet by mouth daily. 07/27/19  Yes Crecencio Mc, MD  metFORMIN (GLUCOPHAGE) 500 MG tablet TAKE 1 TABLET(500 MG) BY MOUTH TWICE DAILY WITH A MEAL 01/25/20  Yes Crecencio Mc, MD  metoprolol succinate (TOPROL-XL) 25 MG 24 hr tablet TAKE 1 TABLET(25 MG) BY MOUTH DAILY 09/07/19  Yes Crecencio Mc, MD  rosuvastatin (CRESTOR) 10 MG tablet TAKE 1 TABLET(10 MG) BY MOUTH DAILY 01/03/20  Yes Crecencio Mc, MD  sertraline (ZOLOFT) 50 MG tablet TAKE 1 AND 1/2 TABLETS BY MOUTH DAILY 11/04/19  Yes Crecencio Mc, MD  meloxicam (MOBIC) 15 MG tablet Take 1 tablet (15 mg total) by mouth daily. 01/18/20   Edrick Kins, DPM    Allergies as of 01/26/2020  . (No Known Allergies)    Family History  Problem  Relation Age of Onset  . Hypertension Mother   . Diabetes Mother   . Uterine cancer Mother   . Hyperlipidemia Father   . Breast cancer Neg Hx   . Ovarian cancer Neg Hx   . Colon cancer Neg Hx     Social History   Socioeconomic History  . Marital status: Married    Spouse name: Not on file  . Number of children: Not on file  . Years of education: Not on file  . Highest education level: Not on file  Occupational History  . Not on file  Tobacco Use  . Smoking status: Former Smoker    Packs/day: 0.25    Years: 10.00    Pack years: 2.50    Types: Cigarettes     Start date: 05/17/2011  . Smokeless tobacco: Never Used  Vaping Use  . Vaping Use: Never used  Substance and Sexual Activity  . Alcohol use: Yes    Alcohol/week: 3.0 standard drinks    Types: 3 Glasses of wine per week    Comment: occas  . Drug use: No  . Sexual activity: Yes    Birth control/protection: Surgical  Other Topics Concern  . Not on file  Social History Narrative  . Not on file   Social Determinants of Health   Financial Resource Strain: Not on file  Food Insecurity: Not on file  Transportation Needs: Not on file  Physical Activity: Not on file  Stress: Not on file  Social Connections: Not on file  Intimate Partner Violence: Not on file    Review of Systems: See HPI, otherwise negative ROS  Physical Exam: BP (!) 132/94   Pulse 72   Temp (!) 96.4 F (35.8 C) (Temporal)   Resp 16   Ht 5' 3"  (1.6 m)   Wt 94.5 kg   LMP 01/20/2017 (Approximate)   SpO2 99%   BMI 36.92 kg/m  General:   Alert,  pleasant and cooperative in NAD Head:  Normocephalic and atraumatic. Neck:  Supple; no masses or thyromegaly. Lungs:  Clear throughout to auscultation.    Heart:  Regular rate and rhythm. Abdomen:  Soft, nontender and nondistended. Normal bowel sounds, without guarding, and without rebound.   Neurologic:  Alert and  oriented x4;  grossly normal neurologically.  Impression/Plan: Christina Stafford is here for an colonoscopy to be performed for colon cancer screening  Risks, benefits, limitations, and alternatives regarding  colonoscopy have been reviewed with the patient.  Questions have been answered.  All parties agreeable.   Sherri Sear, MD  02/28/2020, 8:29 AM

## 2020-02-28 NOTE — Anesthesia Postprocedure Evaluation (Signed)
Anesthesia Post Note  Patient: Christina Stafford  Procedure(s) Performed: COLONOSCOPY WITH PROPOFOL (N/A )  Patient location during evaluation: Endoscopy Anesthesia Type: General Level of consciousness: awake and alert Pain management: pain level controlled Vital Signs Assessment: post-procedure vital signs reviewed and stable Respiratory status: spontaneous breathing, nonlabored ventilation, respiratory function stable and patient connected to nasal cannula oxygen Cardiovascular status: blood pressure returned to baseline and stable Postop Assessment: no apparent nausea or vomiting Anesthetic complications: no   No complications documented.   Last Vitals:  Vitals:   02/28/20 0759 02/28/20 0857  BP: (!) 132/94 106/71  Pulse: 72   Resp: 16   Temp: (!) 35.8 C (!) 36.2 C  SpO2: 99%     Last Pain:  Vitals:   02/28/20 0907  TempSrc:   PainSc: 0-No pain                 Arita Miss

## 2020-02-28 NOTE — Anesthesia Preprocedure Evaluation (Signed)
Anesthesia Evaluation  Patient identified by MRN, date of birth, ID band Patient awake    Reviewed: Allergy & Precautions, NPO status , Patient's Chart, lab work & pertinent test results  History of Anesthesia Complications (+) PONV and history of anesthetic complications  Airway Mallampati: II  TM Distance: >3 FB Neck ROM: Full    Dental no notable dental hx. (+) Teeth Intact   Pulmonary sleep apnea and Continuous Positive Airway Pressure Ventilation , former smoker,    Pulmonary exam normal breath sounds clear to auscultation       Cardiovascular hypertension, Pt. on medications + Valvular Problems/Murmurs (murmur, no tx)  Rhythm:Regular Rate:Normal - Systolic murmurs    Neuro/Psych neg Seizures PSYCHIATRIC DISORDERS Anxiety Depression    GI/Hepatic Neg liver ROS, GERD  Medicated and Controlled,  Endo/Other  diabetes  Renal/GU negative Renal ROS     Musculoskeletal   Abdominal (+) + obese,   Peds  Hematology   Anesthesia Other Findings Past Medical History: No date: Carpal tunnel syndrome on both sides No date: Complication of anesthesia No date: Depression No date: Diabetes mellitus without complication ([facility]) No date: Family history of adverse reaction to anesthesia     Comment:  N/V No date: GERD (gastroesophageal reflux disease) No date: Heart murmur     Comment:  DURING PREGNANCY No date: Hyperlipidemia No date: Hypertension No date: Obesity No date: PONV (postoperative nausea and vomiting) 2011: Sleep apnea     Comment:  using CPAP 6 cm H20  [facility] Sleep Study No date: Vertigo   Reproductive/Obstetrics                             Anesthesia Physical  Anesthesia Plan  ASA: II  Anesthesia Plan: General   Post-op Pain Management:    Induction: Intravenous  PONV Risk Score and Plan: 4 or greater and Ondansetron, Treatment may vary due to age or medical condition,  Propofol infusion and TIVA  Airway Management Planned: Natural Airway  Additional Equipment: None  Intra-op Plan:   Post-operative Plan:   Informed Consent: I have reviewed the patients History and Physical, chart, labs and discussed the procedure including the risks, benefits and alternatives for the proposed anesthesia with the patient or authorized representative who has indicated his/her understanding and acceptance.     Dental advisory given  Plan Discussed with: CRNA and Surgeon  Anesthesia Plan Comments: (Discussed risks of anesthesia with patient, including possibility of difficulty with spontaneous ventilation under anesthesia necessitating airway intervention, PONV, and rare risks such as cardiac or respiratory or neurological events. Patient understands.)        Anesthesia Quick Evaluation

## 2020-02-29 ENCOUNTER — Other Ambulatory Visit: Payer: Self-pay | Admitting: Internal Medicine

## 2020-02-29 ENCOUNTER — Encounter: Payer: Self-pay | Admitting: Gastroenterology

## 2020-03-17 ENCOUNTER — Other Ambulatory Visit: Payer: Self-pay | Admitting: Podiatry

## 2020-03-23 ENCOUNTER — Other Ambulatory Visit: Payer: BC Managed Care – PPO

## 2020-03-23 DIAGNOSIS — Z20822 Contact with and (suspected) exposure to covid-19: Secondary | ICD-10-CM

## 2020-03-26 NOTE — Telephone Encounter (Signed)
Please advise 

## 2020-03-27 LAB — NOVEL CORONAVIRUS, NAA: SARS-CoV-2, NAA: NOT DETECTED

## 2020-04-24 ENCOUNTER — Other Ambulatory Visit: Payer: Self-pay

## 2020-04-26 ENCOUNTER — Other Ambulatory Visit: Payer: Self-pay

## 2020-04-26 ENCOUNTER — Encounter: Payer: Self-pay | Admitting: Gastroenterology

## 2020-04-26 ENCOUNTER — Ambulatory Visit (INDEPENDENT_AMBULATORY_CARE_PROVIDER_SITE_OTHER): Payer: BC Managed Care – PPO | Admitting: Gastroenterology

## 2020-04-26 VITALS — BP 125/86 | HR 76 | Temp 98.6°F | Ht 63.0 in | Wt 216.5 lb

## 2020-04-26 DIAGNOSIS — K760 Fatty (change of) liver, not elsewhere classified: Secondary | ICD-10-CM

## 2020-04-26 DIAGNOSIS — K219 Gastro-esophageal reflux disease without esophagitis: Secondary | ICD-10-CM

## 2020-04-26 NOTE — Progress Notes (Signed)
Cephas Darby, MD [address]  [place]  [place], [place] [address]  Main: [phone]  Fax: [phone]    Gastroenterology Consultation  Referring Provider:     Crecencio Mc, MD Primary Care Physician:  Crecencio Mc, MD Primary Gastroenterologist:  Dr. Cephas Darby Reason for Consultation:    Fatty liver        HPI:   Christina Stafford is a 50 y.o. female referred by Dr. Crecencio Mc, MD  for consultation & management of elevated ALT levels.  Patient has history of metabolic syndrome, he is found to have chronically elevated ALT since 06/2018, 1-2 times upper limit of normal, secondary liver disease work-up revealed elevated ANA levels.  Hepatitis A, B and C serologies negative, anti-smooth muscle, antimitochondrial antibodies negative, ferritin levels were normal.  Patient reports that over last 1 year, she has been gaining weight and her hemoglobin A1c has been up trending.  Within last 3 months, her A1c has slightly improved.  She reports that she went through significant stress at her work in last few months.  She stopped working out, going to gym which she used to do and that has resulted in weight loss in the past.  She does report family history of lupus  Patient does not drink carbonated beverages, sweet tea She does have history of chronic heartburn for which she takes omeprazole once a day for more than 2 years at least.  Coffee triggers her heartburn  Follow-up note 04/26/2020 Patient does not have any GI concerns today.  She is trying to follow healthy diet and trying to lose weight.  She underwent liver biopsy which confirmed fatty liver.  Other secondary liver disease work-up came back negative except for mildly elevated ANA.  There is no histologic evidence of autoimmune hepatitis.  She also reports that taking Nexium 20 mg helps to control her reflux.  She underwent colonoscopy for screening which was normal.  NSAIDs:  None  Antiplts/Anticoagulants/Anti thrombotics: None  GI Procedures:  Colonoscopy 02/28/2020 - The entire examined colon is normal. - The distal rectum and anal verge are normal on retroflexion view. - No specimens collected.  Liver biopsy 02/07/2020 DIAGNOSIS:  A. 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  - FIBROSIS IS NOT IDENTIFIED.   She denies family history of GI malignancy, chronic liver condition   Past Medical History:  Diagnosis Date  . Carpal tunnel syndrome on both sides   . Complication of anesthesia   . Depression   . Diabetes mellitus without complication ([place])   . Family history of adverse reaction to anesthesia    N/V  . GERD (gastroesophageal reflux disease)   . Heart murmur    DURING PREGNANCY  . Hyperlipidemia   . Hypertension   . Obesity   . PONV (postoperative nausea and vomiting)   . Sleep apnea 2011   using CPAP 6 cm H20  [facility] Sleep Study  . Vertigo     Past Surgical History:  Procedure Laterality Date  . ABDOMINAL HYSTERECTOMY    . BREAST SURGERY  2003   negative biopsy  . CHOLECYSTECTOMY  1995  . COLONOSCOPY WITH PROPOFOL N/A 02/28/2020   Procedure: COLONOSCOPY WITH PROPOFOL;  Surgeon: Lin Landsman, MD;  Location: [facility]COPY;  Service: Gastroenterology;  Laterality: N/A;  . HYSTERECTOMY ABDOMINAL WITH SALPINGECTOMY    . LAPAROSCOPIC VAGINAL HYSTERECTOMY WITH  SALPINGO OOPHORECTOMY Bilateral 04/28/2017   Procedure: LAPAROSCOPIC ASSISTED VAGINAL HYSTERECTOMY WITH BILATERAL SALPINGO OOPHORECTOMY with Cystoscopy;  Surgeon: Brayton Mars, MD;  Location: [facility] ORS;  Service: Gynecology;  Laterality: Bilateral;  . OVARIAN CYST REMOVAL    . TONSILLECTOMY AND ADENOIDECTOMY  2003  . TUBAL LIGATION    . UPPER EXTREMITY ANGIOGRAPHY Left 05/19/2017   Procedure: UPPER EXTREMITY ANGIOGRAPHY;  Surgeon: Algernon Huxley, MD;   Location: [place] [facility];  Service: Cardiovascular;  Laterality: Left;    Current Outpatient Medications:  .  ALPRAZolam (XANAX) 0.5 MG tablet, Take 1 tablet (0.5 mg total) by mouth daily as needed for anxiety (or panic attacks)., Disp: 30 tablet, Rfl: 0 .  blood glucose meter kit and supplies, Dispense based on patient and insurance preference. Use to check blood sugars twice daily., Disp: 1 each, Rfl: 0 .  esomeprazole (NEXIUM) 20 MG capsule, Take 20 mg by mouth every other day. , Disp: , Rfl:  .  glucose blood test strip, Use to check blood sugars twice daily., Disp: 100 each, Rfl: 5 .  hydrochlorothiazide (MICROZIDE) 12.5 MG capsule, TAKE 1 CAPSULE(12.5 MG) BY MOUTH DAILY, Disp: 90 capsule, Rfl: 1 .  levothyroxine (SYNTHROID) 75 MCG tablet, Take 75 mcg by mouth daily., Disp: , Rfl:  .  losartan-hydrochlorothiazide (HYZAAR) 50-12.5 MG tablet, Take 1 tablet by mouth daily., Disp: 90 tablet, Rfl: 3 .  meloxicam (MOBIC) 15 MG tablet, TAKE 1 TABLET(15 MG) BY MOUTH DAILY, Disp: 30 tablet, Rfl: 1 .  metFORMIN (GLUCOPHAGE) 500 MG tablet, TAKE 1 TABLET(500 MG) BY MOUTH TWICE DAILY WITH A MEAL, Disp: 180 tablet, Rfl: 1 .  metoprolol succinate (TOPROL-XL) 25 MG 24 hr tablet, TAKE 1 TABLET(25 MG) BY MOUTH DAILY, Disp: 90 tablet, Rfl: 1 .  rosuvastatin (CRESTOR) 10 MG tablet, TAKE 1 TABLET(10 MG) BY MOUTH DAILY, Disp: 90 tablet, Rfl: 3 .  sertraline (ZOLOFT) 50 MG tablet, TAKE 1 AND 1/2 TABLETS BY MOUTH DAILY, Disp: 45 tablet, Rfl: 5   Family History  Problem Relation Age of Onset  . Hypertension Mother   . Diabetes Mother   . Uterine cancer Mother   . Hyperlipidemia Father   . Breast cancer Neg Hx   . Ovarian cancer Neg Hx   . Colon cancer Neg Hx      Social History   Tobacco Use  . Smoking status: Former Smoker    Packs/day: 0.25    Years: 10.00    Pack years: 2.50    Types: Cigarettes    Start date: 05/17/2011  . Smokeless tobacco: Never Used  Vaping Use  . Vaping Use: Never  used  Substance Use Topics  . Alcohol use: Yes    Alcohol/week: 3.0 standard drinks    Types: 3 Glasses of wine per week    Comment: occas  . Drug use: No    Allergies as of 04/26/2020  . (No Known Allergies)    Review of Systems:    All systems reviewed and negative except where noted in HPI.   Physical Exam:  BP 125/86 (BP Location: Left Arm, Patient Position: Sitting, Cuff Size: Normal)   Pulse 76   Temp 98.6 F (37 C) (Oral)   Ht 5' 3"  (1.6 m)   Wt 216 lb 8 oz (98.2 kg)   LMP 01/20/2017 (Approximate)   BMI 38.35 kg/m  Patient's last menstrual period was 01/20/2017 (approximate).  General:   Alert,  Well-developed, well-nourished, pleasant and cooperative in NAD Head:  Normocephalic and  atraumatic. Eyes:  Sclera clear, no icterus.   Conjunctiva pink. Ears:  Normal auditory acuity. Nose:  No deformity, discharge, or lesions. Mouth:  No deformity or lesions,oropharynx pink & moist. Neck:  Supple; no masses or thyromegaly. Lungs:  Respirations even and unlabored.  Clear throughout to auscultation.   No wheezes, crackles, or rhonchi. No acute distress. Heart:  Regular rate and rhythm; no murmurs, clicks, rubs, or gallops. Abdomen:  Normal bowel sounds. Soft, non-tender and non-distended without masses, hepatosplenomegaly or hernias noted.  No guarding or rebound tenderness.   Rectal: Not performed Msk:  Symmetrical without gross deformities. Good, equal movement & strength bilaterally. Pulses:  Normal pulses noted. Extremities:  No clubbing or edema.  No cyanosis. Neurologic:  Alert and oriented x3;  grossly normal neurologically. Skin:  Intact without significant lesions or rashes. No jaundice. Lymph Nodes:  No significant cervical adenopathy. Psych:  Alert and cooperative. Normal mood and affect.  Imaging Studies: No abdominal imaging  Assessment and Plan:   Christina Stafford is a 50 y.o. pleasant female with metabolic syndrome, hypothyroidism is seen in  consultation for chronic GERD, fatty liver, biopsy-proven  Chronic GERD Continue omeprazole 20 mg 1-2 times daily before meals Continue antireflux lifestyle  NASH, without fibrosis, biopsy-proven Secondary liver disease work-up is negative except mildly elevated ANA levels Reiterated on healthy lifestyle, weight loss and exercise Recheck LFTs today and every 6 to 12 months  Follow up annually or as needed   Cephas Darby, MD

## 2020-04-27 LAB — HEPATIC FUNCTION PANEL
ALT: 52 IU/L — ABNORMAL HIGH (ref 0–32)
AST: 42 IU/L — ABNORMAL HIGH (ref 0–40)
Albumin: 4.6 g/dL (ref 3.8–4.9)
Alkaline Phosphatase: 81 IU/L (ref 44–121)
Bilirubin Total: <0.2 mg/dL (ref 0.0–1.2)
Bilirubin, Direct: <0.1 mg/dL (ref 0.00–0.40)
Total Protein: 7 g/dL (ref 6.0–8.5)

## 2020-05-15 ENCOUNTER — Ambulatory Visit: Payer: BC Managed Care – PPO | Admitting: Internal Medicine

## 2020-06-06 ENCOUNTER — Other Ambulatory Visit: Payer: Self-pay

## 2020-06-06 MED ORDER — LOSARTAN POTASSIUM-HCTZ 50-12.5 MG PO TABS: 1.0000 | ORAL_TABLET | Freq: Every day | ORAL | 0 refills | Status: DC

## 2020-06-14 ENCOUNTER — Ambulatory Visit: Payer: BC Managed Care – PPO | Admitting: Internal Medicine

## 2020-06-16 ENCOUNTER — Other Ambulatory Visit: Payer: Self-pay | Admitting: Internal Medicine

## 2020-07-01 ENCOUNTER — Other Ambulatory Visit: Payer: Self-pay | Admitting: Internal Medicine

## 2020-07-10 ENCOUNTER — Other Ambulatory Visit: Payer: Self-pay

## 2020-07-10 ENCOUNTER — Encounter: Payer: Self-pay | Admitting: Internal Medicine

## 2020-07-10 ENCOUNTER — Ambulatory Visit (INDEPENDENT_AMBULATORY_CARE_PROVIDER_SITE_OTHER): Payer: PRIVATE HEALTH INSURANCE | Admitting: Internal Medicine

## 2020-07-10 ENCOUNTER — Other Ambulatory Visit: Payer: Self-pay | Admitting: Internal Medicine

## 2020-07-10 VITALS — BP 112/74 | HR 76 | Temp 96.1°F | Resp 15 | Ht 63.0 in | Wt 217.6 lb

## 2020-07-10 DIAGNOSIS — E669 Obesity, unspecified: Secondary | ICD-10-CM

## 2020-07-10 DIAGNOSIS — Z9229 Personal history of other drug therapy: Secondary | ICD-10-CM

## 2020-07-10 DIAGNOSIS — K7581 Nonalcoholic steatohepatitis (NASH): Secondary | ICD-10-CM

## 2020-07-10 DIAGNOSIS — E119 Type 2 diabetes mellitus without complications: Secondary | ICD-10-CM

## 2020-07-10 DIAGNOSIS — E782 Mixed hyperlipidemia: Secondary | ICD-10-CM

## 2020-07-10 DIAGNOSIS — E039 Hypothyroidism, unspecified: Secondary | ICD-10-CM

## 2020-07-10 DIAGNOSIS — I152 Hypertension secondary to endocrine disorders: Secondary | ICD-10-CM

## 2020-07-10 DIAGNOSIS — E1169 Type 2 diabetes mellitus with other specified complication: Secondary | ICD-10-CM

## 2020-07-10 DIAGNOSIS — E1159 Type 2 diabetes mellitus with other circulatory complications: Secondary | ICD-10-CM

## 2020-07-10 LAB — LIPID PANEL
Cholesterol: 210 mg/dL — ABNORMAL HIGH (ref 0–200)
HDL: 44.8 mg/dL (ref >39.00)
NonHDL: 165.53
Total CHOL/HDL Ratio: 5
Triglycerides: 262 mg/dL — ABNORMAL HIGH (ref 0.0–149.0)
VLDL: 52.4 mg/dL — ABNORMAL HIGH (ref 0.0–40.0)

## 2020-07-10 LAB — COMPREHENSIVE METABOLIC PANEL
ALT: 46 U/L — ABNORMAL HIGH (ref 0–35)
AST: 37 U/L (ref 0–37)
Albumin: 4.2 g/dL (ref 3.5–5.2)
Alkaline Phosphatase: 73 U/L (ref 39–117)
BUN: 13 mg/dL (ref 6–23)
CO2: 27 mEq/L (ref 19–32)
Calcium: 10 mg/dL (ref 8.4–10.5)
Chloride: 101 mEq/L (ref 96–112)
Creatinine, Ser: 0.74 mg/dL (ref 0.40–1.20)
GFR: 93.35 mL/min (ref >60.00)
Glucose, Bld: 141 mg/dL — ABNORMAL HIGH (ref 70–99)
Potassium: 3.9 mEq/L (ref 3.5–5.1)
Sodium: 138 mEq/L (ref 135–145)
Total Bilirubin: 0.4 mg/dL (ref 0.2–1.2)
Total Protein: 6.9 g/dL (ref 6.0–8.3)

## 2020-07-10 LAB — MICROALBUMIN / CREATININE URINE RATIO
Creatinine,U: 76.9 mg/dL
Microalb Creat Ratio: 0.9 mg/g (ref 0.0–30.0)
Microalb, Ur: <0.7 mg/dL (ref 0.0–1.9)

## 2020-07-10 LAB — LDL CHOLESTEROL, DIRECT: Direct LDL: 141 mg/dL

## 2020-07-10 LAB — HEMOGLOBIN A1C: Hgb A1c MFr Bld: 7.4 % — ABNORMAL HIGH (ref 4.6–6.5)

## 2020-07-10 LAB — TSH: TSH: 3.34 u[IU]/mL (ref 0.35–4.50)

## 2020-07-10 NOTE — Patient Instructions (Addendum)
Take no more than 800 Ius of Vitamin E daily  (for fatty liver)  I agree with your goal weight of 185  If you have not lost 4 lbs over the next month and would like to consider a diabetes medication that reduces appetite,  Let me know    Fatty Liver Disease  The liver converts food into energy, removes toxic material from the blood, makes important proteins, and absorbs necessary vitamins from food. Fatty liver disease occurs when too much fat has built up in your liver cells. Fatty liver disease is also called hepatic steatosis. In many cases, fatty liver disease does not cause symptoms or problems. It is often diagnosed when tests are being done for other reasons. However, over time, fatty liver can cause inflammation that may lead to more serious liver problems, such as scarring of the liver (cirrhosis) and liver failure. Fatty liver is associated with insulin resistance, increased body fat, high blood pressure (hypertension), and high cholesterol. These are features of metabolic syndrome and increase your risk for stroke, diabetes, and heart disease. What are the causes? This condition may be caused by components of metabolic syndrome:  Obesity.  Insulin resistance.  High cholesterol. Other causes:  Alcohol abuse.  Poor nutrition.  Cushing syndrome.  Pregnancy.  Certain drugs.  Poisons.  Some viral infections. What increases the risk? You are more likely to develop this condition if you:  Abuse alcohol.  Are overweight.  Have diabetes.  Have hepatitis.  Have a high triglyceride level.  Are pregnant. What are the signs or symptoms? Fatty liver disease often does not cause symptoms. If symptoms do develop, they can include:  Fatigue and weakness.  Weight loss.  Confusion.  Nausea, vomiting, or abdominal pain.  Yellowing of your skin and the white parts of your eyes (jaundice).  Itchy skin. How is this diagnosed? This condition may be diagnosed  by:  A physical exam and your medical history.  Blood tests.  Imaging tests, such as an ultrasound, CT scan, or MRI.  A liver biopsy. A small sample of liver tissue is removed using a needle. The sample is then looked at under a microscope. How is this treated? Fatty liver disease is often caused by other health conditions. Treatment for fatty liver may involve medicines and lifestyle changes to manage conditions such as:  Alcoholism.  High cholesterol.  Diabetes.  Being overweight or obese. Follow these instructions at home:  Do not drink alcohol. If you have trouble quitting, ask your health care provider how to safely quit with the help of medicine or a supervised program. This is important to keep your condition from getting worse.  Eat a healthy diet as told by your health care provider. Ask your health care provider about working with a dietitian to develop an eating plan.  Exercise regularly. This can help you lose weight and control your cholesterol and diabetes. Talk to your health care provider about an exercise plan and which activities are best for you.  Take over-the-counter and prescription medicines only as told by your health care provider.  Keep all follow-up visits. This is important.   Contact a health care provider if:  You have trouble controlling your: ? Blood sugar. This is especially important if you have diabetes. ? Cholesterol. ? Drinking of alcohol. Get help right away if:  You have abdominal pain.  You have jaundice.  You have nausea and are vomiting.  You vomit blood or material that looks like coffee grounds.  You have stools that are black, tar-like, or bloody. Summary  Fatty liver disease develops when too much fat builds up in the cells of your liver.  Fatty liver disease often causes no symptoms or problems. However, over time, fatty liver can cause inflammation that may lead to more serious liver problems, such as scarring of the  liver (cirrhosis).  You are more likely to develop this condition if you abuse alcohol, are pregnant, are overweight, have diabetes, have hepatitis, or have high triglyceride or cholesterol levels.  Contact your health care provider if you have trouble controlling your blood sugar, cholesterol, or drinking of alcohol. This information is not intended to replace advice given to you by your health care provider. Make sure you discuss any questions you have with your health care provider. Document Revised: 12/16/2019 Document Reviewed: 12/16/2019 Elsevier Patient Education  [place].

## 2020-07-10 NOTE — Assessment & Plan Note (Signed)
Thyroid function is WNL on current dose.  No current changes needed.   Lab Results  Component Value Date   TSH 3.34 07/10/2020

## 2020-07-10 NOTE — Assessment & Plan Note (Signed)
By biopsy .  No fibrosis. Taking statin and metformin.  ALT elevated;  Will recommend weight loss, , vitamin E  Lab Results  Component Value Date   ALT 46 (H) 07/10/2020   AST 37 07/10/2020   ALKPHOS 73 07/10/2020   BILITOT 0.4 07/10/2020

## 2020-07-10 NOTE — Assessment & Plan Note (Addendum)
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 .  Will recommend Opzempic.   Lab Results  Component Value Date   HGBA1C 7.4 (H) 07/10/2020   Lab Results  Component Value Date   CREATININE 0.74 07/10/2020

## 2020-07-10 NOTE — Progress Notes (Signed)
Subjective:  Patient ID: Christina Stafford, female    DOB: Aug 14, 1970  Age: 50 y.o. MRN: [id number]  CC: The primary encounter diagnosis was DM type 2 with diabetic mixed hyperlipidemia ([place]). Diagnoses of COVID-19 vaccine series completed, Acquired hypothyroidism, NASH (nonalcoholic steatohepatitis), and Obesity, diabetes, and hypertension syndrome ([place]) were also pertinent to this visit.  HPI Christina Stafford presents for follow up   This visit occurred during the SARS-CoV-2 public health emergency.  Safety protocols were in place, including screening questions prior to the visit, additional usage of staff PPE, and extensive cleaning of exam room while observing appropriate contact time as indicated for disinfecting solutions.    Patient has received three doses of the available COVID 19 vaccine without complications.  Last dose October 2021 Patient continues to mask when outside of the home except when walking in yard or at safe distances from others .  Patient denies any change in mood or development of unhealthy behaviors resulting from the pandemic's restriction of activities and socialization.    6 month follow up on Type 2 DM,  Hypertension, hyperlipidemia and obesity.  She has had an intentional net weight loss of 0  lbs since last visit.  She currently lacks motivation to resume [place] have been consistently under 150 post prandial  and  120 fasting    HTN: Patient is taking her medications as prescribed and notes no adverse effects.  Home BP readings have been done about once per week and are  generally < 130/80 .  She is avoiding added salt in her diet and is walking for about 30 minutes 5 /7 dasy per week .  Recovering from  plantar fasciitis ,  Wearing a boot at night .  Outpatient Medications Prior to Visit  Medication Sig Dispense Refill  . ALPRAZolam (XANAX) 0.5 MG tablet Take 1 tablet (0.5 mg total) by mouth daily as needed for anxiety (or panic attacks).  30 tablet 0  . blood glucose meter kit and supplies Dispense based on patient and insurance preference. Use to check blood sugars twice daily. 1 each 0  . esomeprazole (NEXIUM) 20 MG capsule Take 20 mg by mouth every other day.     Marland Kitchen glucose blood test strip Use to check blood sugars twice daily. 100 each 5  . hydrochlorothiazide (MICROZIDE) 12.5 MG capsule TAKE 1 CAPSULE(12.5 MG) BY MOUTH DAILY 90 capsule 1  . levothyroxine (SYNTHROID) 88 MCG tablet TAKE 1 TABLET EVERY DAY ON EMPTY STOMACHWITH A GLASS OF WATER AT LEAST 30-60 MINBEFORE BREAKFAST 90 tablet 1  . losartan-hydrochlorothiazide (HYZAAR) 50-12.5 MG tablet TAKE ONE TABLET (50/12.5 MG) BY MOUTH EVERY DAY 90 tablet 0  . metoprolol succinate (TOPROL-XL) 25 MG 24 hr tablet TAKE 1 TABLET(25 MG) BY MOUTH DAILY 90 tablet 1  . rosuvastatin (CRESTOR) 10 MG tablet TAKE 1 TABLET(10 MG) BY MOUTH DAILY 90 tablet 3  . sertraline (ZOLOFT) 50 MG tablet TAKE 1 AND 1/2 TABLETS BY MOUTH DAILY 45 tablet 5  . metFORMIN (GLUCOPHAGE) 500 MG tablet TAKE 1 TABLET(500 MG) BY MOUTH TWICE DAILY WITH A MEAL 180 tablet 1  . levothyroxine (SYNTHROID) 75 MCG tablet Take 75 mcg by mouth daily. (Patient not taking: Reported on 07/10/2020)    . meloxicam (MOBIC) 15 MG tablet TAKE 1 TABLET(15 MG) BY MOUTH DAILY (Patient not taking: Reported on 07/10/2020) 30 tablet 1   No facility-administered medications prior to visit.    Review of Systems;  Patient denies headache, fevers,  malaise, unintentional weight loss, skin rash, eye pain, sinus congestion and sinus pain, sore throat, dysphagia,  hemoptysis , cough, dyspnea, wheezing, chest pain, palpitations, orthopnea, edema, abdominal pain, nausea, melena, diarrhea, constipation, flank pain, dysuria, hematuria, urinary  Frequency, nocturia, numbness, tingling, seizures,  Focal weakness, Loss of consciousness,  Tremor, insomnia, depression, anxiety, and suicidal ideation.      Objective:  BP 112/74 (BP Location: Left Arm,  Patient Position: Sitting, Cuff Size: Large)   Pulse 76   Temp (!) 96.1 F (35.6 C) (Oral)   Resp 15   Ht _0  (1.6 m)   Wt 217 lb 9.6 oz (98.7 kg)   LMP 01/20/2017 (Approximate)   SpO2 97%   BMI 38.55 kg/m   BP Readings from Last 3 Encounters:  07/10/20 112/74  04/26/20 125/86  02/28/20 106/71    Wt Readings from Last 3 Encounters:  07/10/20 217 lb 9.6 oz (98.7 kg)  04/26/20 216 lb 8 oz (98.2 kg)  02/28/20 208 lb 6.8 oz (94.5 kg)    General appearance: alert, cooperative and appears stated age Ears: normal TM's and external ear canals both ears Throat: lips, mucosa, and tongue normal; teeth and gums normal Neck: no adenopathy, no carotid bruit, supple, symmetrical, trachea midline and thyroid not enlarged, symmetric, no tenderness/mass/nodules Back: symmetric, no curvature. ROM normal. No CVA tenderness. Lungs: clear to auscultation bilaterally Heart: regular rate and rhythm, S1, S2 normal, no murmur, click, rub or gallop Abdomen: soft, non-tender; bowel sounds normal; no masses,  no organomegaly Pulses: 2+ and symmetric Skin: Skin color, texture, turgor normal. No rashes or lesions Lymph nodes: Cervical, supraclavicular, and axillary nodes normal.  Lab Results  Component Value Date   HGBA1C 7.4 (H) 07/10/2020   HGBA1C 6.7 (H) 02/09/2020   HGBA1C 7.1 (H) 11/08/2019    Lab Results  Component Value Date   CREATININE 0.74 07/10/2020   CREATININE 0.76 02/09/2020   CREATININE 0.76 02/09/2020    Lab Results  Component Value Date   WBC 5.9 02/07/2020   HGB 13.2 02/07/2020   HCT 39.0 02/07/2020   PLT 289 02/07/2020   GLUCOSE 141 (H) 07/10/2020   CHOL 210 (H) 07/10/2020   TRIG 262.0 (H) 07/10/2020   HDL 44.80 07/10/2020   LDLDIRECT 141.0 07/10/2020   LDLCALC 230 (H) 06/07/2015   ALT 46 (H) 07/10/2020   AST 37 07/10/2020   NA 138 07/10/2020   K 3.9 07/10/2020   CL 101 07/10/2020   CREATININE 0.74 07/10/2020   BUN 13 07/10/2020   CO2 27 07/10/2020   TSH  3.34 07/10/2020   INR 0.9 02/07/2020   HGBA1C 7.4 (H) 07/10/2020   MICROALBUR <0.7 07/10/2020    No results found.  Assessment & Plan:   Problem List Items Addressed This Visit      Unprioritized   Hypothyroidism    Thyroid function is WNL on current dose.  No current changes needed.   Lab Results  Component Value Date   TSH 3.34 07/10/2020         Relevant Orders   TSH (Completed)   NASH (nonalcoholic steatohepatitis)    By biopsy .  No fibrosis. Taking statin and metformin.  ALT elevated;  Will recommend weight loss, , vitamin E  Lab Results  Component Value Date   ALT 46 (H) 07/10/2020   AST 37 07/10/2020   ALKPHOS 73 07/10/2020   BILITOT 0.4 07/10/2020         Obesity, diabetes, and hypertension syndrome ([place])  She is no longer  losing weight because she has stopped  the commercial [employer]; however,  She has lost some control of diabetes. .  Will recommend Opzempic.   Lab Results  Component Value Date   HGBA1C 7.4 (H) 07/10/2020   Lab Results  Component Value Date   CREATININE 0.74 07/10/2020          Other Visit Diagnoses    DM type 2 with diabetic mixed hyperlipidemia ([place])    -  Primary   Relevant Orders   Comprehensive metabolic panel (Completed)   Hemoglobin A1c (Completed)   Microalbumin / creatinine urine ratio (Completed)   Lipid panel (Completed)   COVID-19 vaccine series completed       Relevant Orders   SARS-CoV-2 Semi-Quantitative Total Antibody, Spike      I have discontinued Unnamed F. Domingo's meloxicam. I am also having her maintain her esomeprazole, blood glucose meter kit and supplies, glucose blood, sertraline, ALPRAZolam, rosuvastatin, hydrochlorothiazide, metoprolol succinate, levothyroxine, and losartan-hydrochlorothiazide.  No orders of the defined types were placed in this encounter.   Medications Discontinued During This Encounter  Medication Reason  . levothyroxine (SYNTHROID) 75 MCG tablet   .  meloxicam (MOBIC) 15 MG tablet     Follow-up: Return in about 6 months (around 01/09/2021).   Crecencio Mc, MD

## 2020-07-12 LAB — SARS-COV-2 SEMI-QUANTITATIVE TOTAL ANTIBODY, SPIKE: SARS COV2 AB, Total Spike Semi QN: >2500 U/mL — ABNORMAL HIGH (ref <0.8)

## 2020-07-13 ENCOUNTER — Other Ambulatory Visit: Payer: Self-pay | Admitting: Internal Medicine

## 2020-07-13 MED ORDER — OZEMPIC (0.25 OR 0.5 MG/DOSE) 2 MG/1.5ML ~~LOC~~ SOPN: 0.5000 mg | PEN_INJECTOR | SUBCUTANEOUS | 1 refills | Status: DC

## 2020-07-18 ENCOUNTER — Telehealth: Payer: Self-pay

## 2020-07-18 NOTE — Telephone Encounter (Signed)
PA for Ozempic has been submitted on covermymeds.

## 2020-07-27 ENCOUNTER — Telehealth: Payer: PRIVATE HEALTH INSURANCE | Admitting: Physician Assistant

## 2020-07-27 ENCOUNTER — Telehealth: Payer: Self-pay | Admitting: Internal Medicine

## 2020-07-27 DIAGNOSIS — E1159 Type 2 diabetes mellitus with other circulatory complications: Secondary | ICD-10-CM

## 2020-07-27 DIAGNOSIS — E669 Obesity, unspecified: Secondary | ICD-10-CM

## 2020-07-27 DIAGNOSIS — L304 Erythema intertrigo: Secondary | ICD-10-CM

## 2020-07-27 MED ORDER — NYSTATIN 100000 UNIT/GM EX CREA: 1.0000 "application " | TOPICAL_CREAM | Freq: Two times a day (BID) | CUTANEOUS | 0 refills | Status: DC

## 2020-07-27 NOTE — Telephone Encounter (Signed)
See unrouted message  thanks

## 2020-07-27 NOTE — Telephone Encounter (Signed)
Insurance has denied Ozempic stating that the reason is because the "drug is a plan exclusion".

## 2020-07-27 NOTE — Telephone Encounter (Signed)
Patient has type 2 diabetes.  How is this possible?  [name] do you have any ideas?  I can make a referral once I contact patient

## 2020-07-27 NOTE — Progress Notes (Signed)
E Visit for Rash  We are sorry that you are not feeling well. Here is how we plan to help!  Based upon your presentation it appears you have a fungal infection.  I have prescribed:  Nystatin Cream Apply topically twice daily   HOME CARE:   Take cool showers and avoid direct sunlight.  Apply cool compress or wet dressings.  Take a bath in an oatmeal bath.  Sprinkle content of one Aveeno packet under running faucet with comfortably warm water.  Bathe for 15-20 minutes, 1-2 times daily.  Pat dry with a towel. Do not rub the rash.  Use hydrocortisone cream.  Take an antihistamine like Benadryl for widespread rashes that itch.  The adult dose of Benadryl is 25-50 mg by mouth 4 times daily.  Caution:  This type of medication may cause sleepiness.  Do not drink alcohol, drive, or operate dangerous machinery while taking antihistamines.  Do not take these medications if you have prostate enlargement.  Read package instructions thoroughly on all medications that you take.  GET HELP RIGHT AWAY IF:   Symptoms don't go away after treatment.  Severe itching that persists.  If you rash spreads or swells.  If you rash begins to smell.  If it blisters and opens or develops a yellow-brown crust.  You develop a fever.  You have a sore throat.  You become short of breath.  MAKE SURE YOU:  Understand these instructions. Will watch your condition. Will get help right away if you are not doing well or get worse.  Thank you for choosing an e-visit. Your e-visit answers were reviewed by a board certified advanced clinical practitioner to complete your personal care plan. Depending upon the condition, your plan could have included both over the counter or prescription medications. Please review your pharmacy choice. Be sure that the pharmacy you have chosen is open so that you can pick up your prescription now.  If there is a problem you may message your provider in [place] to have the  prescription routed to another pharmacy. Your safety is important to [place]. If you have drug allergies check your prescription carefully.  For the next 24 hours, you can use MyChart to ask questions about today's visit, request a non-urgent call back, or ask for a work or school excuse from your e-visit provider. You will get an email in the next two days asking about your experience. I hope that your e-visit has been valuable and will speed your recovery.  I provided 5 minutes of non face-to-face time during this encounter for chart review and documentation.

## 2020-07-27 NOTE — Telephone Encounter (Signed)
Can you print the denial paperwork so I can see it?

## 2020-07-27 NOTE — Telephone Encounter (Signed)
MyChart message sent and CCM referral placed

## 2020-07-28 NOTE — Telephone Encounter (Signed)
Resubmitted the PA with some additional information to see if the medication could be used.

## 2020-07-31 ENCOUNTER — Telehealth: Payer: Self-pay

## 2020-07-31 NOTE — Chronic Care Management (AMB) (Signed)
  Care Management   Note  07/31/2020 Name: Christina Stafford MRN: [id number] DOB: October 17, 1970  Christina Stafford is a 50 y.o. year old female who is a primary care patient of Derrel Nip, Aris Everts, MD. I reached out to Vallarie Mare by phone today in response to a referral sent by Christina Stafford health plan.    Christina Stafford was given information about care management services today including:  1. Care management services include personalized support from designated clinical staff supervised by her physician, including individualized plan of care and coordination with other care providers 2. 24/7 contact phone numbers for assistance for urgent and routine care needs. 3. The patient may stop care management services at any time by phone call to the office staff.  Patient agreed to services and verbal consent obtained.   Follow up plan: Telephone appointment with care management team member scheduled for:08/11/2020  Noreene Larsson, [place], [place], [place] [address] Direct Dial: [phone] [email or web address]

## 2020-08-09 ENCOUNTER — Other Ambulatory Visit: Payer: Self-pay | Admitting: Internal Medicine

## 2020-08-11 ENCOUNTER — Ambulatory Visit: Payer: PRIVATE HEALTH INSURANCE | Admitting: Pharmacist

## 2020-08-11 DIAGNOSIS — E1169 Type 2 diabetes mellitus with other specified complication: Secondary | ICD-10-CM

## 2020-08-11 DIAGNOSIS — E669 Obesity, unspecified: Secondary | ICD-10-CM

## 2020-08-11 DIAGNOSIS — E1159 Type 2 diabetes mellitus with other circulatory complications: Secondary | ICD-10-CM

## 2020-08-11 DIAGNOSIS — K7581 Nonalcoholic steatohepatitis (NASH): Secondary | ICD-10-CM

## 2020-08-11 DIAGNOSIS — I152 Hypertension secondary to endocrine disorders: Secondary | ICD-10-CM

## 2020-08-11 DIAGNOSIS — E782 Mixed hyperlipidemia: Secondary | ICD-10-CM

## 2020-08-11 MED ORDER — ROSUVASTATIN CALCIUM 20 MG PO TABS: 20.0000 mg | ORAL_TABLET | Freq: Every day | ORAL | 1 refills | Status: DC

## 2020-08-11 NOTE — Chronic Care Management (AMB) (Signed)
Care Management   Pharmacy Note  08/11/2020 Name: Christina Stafford MRN: [id number] DOB: 1971/02/23  Subjective: Christina Stafford is a 50 y.o. year old female who is a primary care patient of Crecencio Mc, MD. The Care Management team was consulted for assistance with care management and care coordination needs.    Engaged with patient by telephone for initial visit in response to provider referral for pharmacy case management and/or care coordination services.   The patient was given information about Care Management services today including:  1. Care Management services includes personalized support from designated clinical staff supervised by the patient's primary care provider, including individualized plan of care and coordination with other care providers. 2. 24/7 contact phone numbers for assistance for urgent and routine care needs. 3. The patient may stop case management services at any time by phone call to the office staff.  Patient agreed to services and consent obtained.  Assessment:  Review of patient status, including review of consultants reports, laboratory and other test data, was performed as part of comprehensive evaluation and provision of chronic care management services.   SDOH (Social Determinants of Health) assessments and interventions performed:    Objective:  Lab Results  Component Value Date   CREATININE 0.74 07/10/2020   CREATININE 0.76 02/09/2020   CREATININE 0.76 02/09/2020    Lab Results  Component Value Date   HGBA1C 7.4 (H) 07/10/2020       Component Value Date/Time   CHOL 210 (H) 07/10/2020 0844   TRIG 262.0 (H) 07/10/2020 0844   HDL 44.80 07/10/2020 0844   CHOLHDL 5 07/10/2020 0844   VLDL 52.4 (H) 07/10/2020 0844   LDLCALC 230 (H) 06/07/2015 1003   LDLDIRECT 141.0 07/10/2020 0844    Clinical ASCVD: No  The 10-year ASCVD risk score Mikey Bussing DC Jr., et al., 2013) is: 3.9%   Values used to calculate the score:     Age: 32  years     Sex: Female     Is Non-Hispanic African American: No     Diabetic: Yes     Tobacco smoker: No     Systolic Blood Pressure: 656 mmHg     Is BP treated: Yes     HDL Cholesterol: 44.8 mg/dL     Total Cholesterol: 210 mg/dL     BP Readings from Last 3 Encounters:  07/10/20 112/74  04/26/20 125/86  02/28/20 106/71    Care Plan  No Known Allergies  Medications Reviewed Today    Reviewed by De Hollingshead, RPH-CPP (Pharmacist) on 08/11/20 at 1512  Med List Status: <None>  Medication Order Taking? Sig Documenting Provider Last Dose Status Informant  ALPRAZolam (XANAX) 0.5 MG tablet [id number] No Take 1 tablet (0.5 mg total) by mouth daily as needed for anxiety (or panic attacks).  Patient not taking: Reported on 08/11/2020   Crecencio Mc, MD Not Taking Active   blood glucose meter kit and supplies [id number] Yes Dispense based on patient and insurance preference. Use to check blood sugars twice daily. Crecencio Mc, MD Taking Active   cetirizine (ZYRTEC) 10 MG tablet [id number] Yes Take 10 mg by mouth daily. [provider] Taking Active   esomeprazole (NEXIUM) 20 MG capsule [id number] Yes Take 20 mg by mouth daily. [provider] Taking Active Self  glucose blood test strip [id number] Yes Use to check blood sugars twice daily. Crecencio Mc, MD Taking Active   hydrochlorothiazide (MICROZIDE) 12.5 MG capsule [id number] Yes TAKE 1 CAPSULE(12.5  MG) BY MOUTH DAILY Crecencio Mc, MD Taking Active   levothyroxine (SYNTHROID) 88 MCG tablet [id number] Yes TAKE 1 TABLET EVERY DAY ON EMPTY STOMACHWITH A GLASS OF WATER AT LEAST 30-60 MINBEFORE BREAKFAST Crecencio Mc, MD Taking Active   losartan-hydrochlorothiazide (HYZAAR) 50-12.5 MG tablet [id number] Yes TAKE ONE TABLET (50/12.5 MG) BY MOUTH EVERY DAY Crecencio Mc, MD Taking Active   metFORMIN (GLUCOPHAGE) 500 MG tablet [id number] Yes TAKE ONE TABLET BY MOUTH TWICE DAILY WITH A MEAL Crecencio Mc, MD Taking  Active   metoprolol succinate (TOPROL-XL) 25 MG 24 hr tablet [id number] Yes TAKE ONE TABLET (25 MG) BY MOUTH EVERY DAY Crecencio Mc, MD Taking Active   nystatin cream (MYCOSTATIN) [id number] No Apply 1 application topically 2 (two) times daily.  Patient not taking: Reported on 08/11/2020   Mar Daring, PA-C Not Taking Active   rosuvastatin (CRESTOR) 10 MG tablet [id number] Yes TAKE 1 TABLET(10 MG) BY MOUTH DAILY Crecencio Mc, MD Taking Active   Semaglutide,0.25 or 0.5MG/DOS, (OZEMPIC, 0.25 OR 0.5 MG/DOSE,) 2 MG/1.5ML SOPN [id number] No Inject 0.5 mg into the skin once a week.  Patient not taking: Reported on 08/11/2020   Crecencio Mc, MD Not Taking Active   sertraline (ZOLOFT) 50 MG tablet [id number] Yes TAKE 1 AND 1/2 TABLETS BY MOUTH DAILY Crecencio Mc, MD Taking Active   vitamin E (VITAMIN E) 180 MG (400 UNITS) capsule [id number] Yes Take 400 Units by mouth daily. [provider] Taking Active           Patient Active Problem List   Diagnosis Date Noted  . Screening for colon cancer   . NASH (nonalcoholic steatohepatitis) 11/13/2019  . Hypothyroidism 03/14/2018  . Obesity, diabetes, and hypertension syndrome ([place]) 09/01/2017  . Vitamin D deficiency 08/29/2017  . Left forearm pain 04/20/2017  . S/P laparoscopic assisted vaginal hysterectomy (LAVH) 11/21/2016  . Status post tubal ligation 11/21/2016  . Chronic left-sided low back pain 10/16/2016  . Encounter for preventive health examination 05/31/2015  . Heart murmur 11/13/2014  . Familial multiple lipoprotein-type hyperlipidemia 11/01/2014  . Major depressive disorder, single episode 11/01/2014  . GERD (gastroesophageal reflux disease) 12/01/2012  . Generalized anxiety disorder with panic attacks 11/04/2012  . Panic disorder without agoraphobia 11/04/2012  . History of tobacco abuse 03/26/2011  . OSA on CPAP   . Carpal tunnel syndrome on both sides   . Depression   . Hypertension     Conditions to be  addressed/monitored: HTN, HLD and DMII  Care Plan : Medication Management  Updates made by De Hollingshead, RPH-CPP since 08/11/2020 12:00 AM    Problem: Diabetes, HLD     Long-Range Goal: Disease Progression Progression   Start Date: 08/11/2020  This Visit's Progress: On track  Priority: High  Note:   Current Barriers:  . Unable to achieve control of diabetes, hyperlipidemia   Pharmacist Clinical Goal(s):  Marland Kitchen Over the next 90 days, patient will achieve control of diabetes as evidenced by A1c  through collaboration with PharmD and provider.   Interventions: . 1:1 collaboration with Crecencio Mc, MD regarding development and update of comprehensive plan of care as evidenced by provider attestation and co-signature . Inter-disciplinary care team collaboration (see longitudinal plan of care) . Comprehensive medication review performed; medication list updated in electronic medical record  SDOH: . Her and husband, empty nester. Husband goes to bed early and she stays up and snacks.   Diabetes, Fatty Liver DIsease: Marland Kitchen Uncontrolled;  current treatment: metformin 500 mg BID, discussion about addition of Ozempic, but insurance denied coverage without reason besides "plan exclusion" . Current meal patterns: breakfast: scrambled eggs w/ olive oil, sometimes salad, infrequent bacon; lunch sandwich - smaller slices of bread, low carb wrap, [place], cheese, mustard + mayo, potato chips, dinner: does eat out often, steak, shrimp, baked potato and a salad; snacks: bedtime snacks - chips, oreos; drinks: coffee, starbucks cold brew, water,  . Current exercise: for the past 2 weeks, walks in the morning on the weekdays ~ 1 mile (total of 60 minutes) . Discussed impact of high carb snacks, portion sizes on weight, A1c. Discussed need to minimize carbohydrate portion sizes. Marland Kitchen PA for Ozempic was previously submitted and was denied due to "drug is a plan exclusion". Called insurance. Was told the covered  alternatives are repaglinide (which is not in the ADA algorithm of treatment), netaglinide  (which is not in the ADA algorithm of treatment), rosiglitazone (which is not available in the [place]), pioglitazone (which is not an appropriate option for this patient given risk of weight gain), glipizide (which is not an appropriate option for this patient given risk of weight gain and hypoglycemia), or glimepiride (which is not an appropriate option for this patient given risk of weight gain and hypoglycemia). Rep noted that a PA could be submitted directly to [employer] via fax, but that she did not have the fax number. Called [employer] customer service to obtain fax number. They note that for her plan, a provider cannot start an appeal, the patient must. Directed me to a form on the website that the patient can complete to start an appeal and to sign authorization to our office to submit it on her behalf. Communicated with her to complete this and provide back to me. I will submit an appeal for Ozempic at that time.   . Encouraged to continue achieving at least 150 minutes of moderate intensity exercise weekly. Encouraged to increase intensity/speed of walking or continue to increase distance over time.   Hypertension: . Controlled per last office visit; current treatment: HCTZ 12.5 mg QAM, losartan/HCTZ 50/12.5, metoprolol succinate 25 mg QAM . Current home readings: did not discuss today . Will evaluate ambulatory BP readings moving forward  Hyperlipidemia: . Uncontrolled; current treatment: rosuvastatin 10 mg, on-treatment LDL is still 141 . Reports many members of her family have a history of high cholesterol . Current dietary patterns: does like red meats, but has tried to cut back . Increase rosuvastatin to 20 mg daily. CMP ordered and lab work in 6 weeks is scheduled. If no liver enzyme elevations after therapy increase, will likely need to maximize rosuvastatin to 20 mg daily. May consider PCSK9i in  the future if LDL not at goal to reduce risk of ASCVD.   Hypothyroidism: . Controlled per last lab work; current regimen: levothyroxine 88 mcg daily . Confirmed appropriate administration . Continue current regimen at this time  Depression . Controlled per patient report;  current treatment: sertraline 75 mg daily;  . Continue current regimen at this time  GERD: . Controlled per patient report; current regimen: esomeprazole 20 mg daily . Continue current regimen at this time  Patient Goals/Self-Care Activities . Over the next 90 days, patient will:  - take medications as prescribed check glucose twice daily, document, and provide at future appointments target a minimum of 150 minutes of moderate intensity exercise weekly engage in dietary modifications by reducing carbohydrate serving sizes  Follow Up Plan: Telephone  follow up appointment with care management team member scheduled for: pending medication access plan       Medication Assistance:  None required.  Patient affirms current coverage meets needs.  Follow Up:  Patient agrees to Care Plan and Follow-up.  Plan: Telephone follow up appointment with care management team member scheduled for:  ~ pending medication access plan  [name] Darnelle Maffucci, PharmD, [place], [employer] Clinical Pharmacist [employer] at [facility] [phone]

## 2020-08-11 NOTE — Patient Instructions (Signed)
Visit Information  PATIENT GOALS:  Goals Addressed              This Visit's Progress     Patient Stated   .  Medication Management (pt-stated)        Patient Goals/Self-Care Activities . Over the next 90 days, patient will:  - take medications as prescribed check glucose twice daily, document, and provide at future appointments target a minimum of 150 minutes of moderate intensity exercise weekly engage in dietary modifications by reducing carbohydrate serving sizes       Christina Stafford was given information about Care Management services by the embedded care coordination team including:  1. Care Management services include personalized support from designated clinical staff supervised by her physician, including individualized plan of care and coordination with other care providers 2. 24/7 contact phone numbers for assistance for urgent and routine care needs. 3. The patient may stop CCM services at any time (effective at the end of the month) by phone call to the office staff.  Patient agreed to services and verbal consent obtained.   Patient verbalizes understanding of instructions provided today and agrees to view in [place].   Plan: Telephone follow up appointment with care management team member scheduled for:  ~ pending medication access plan  [name] Darnelle Maffucci, PharmD, [place], [place] Clinical Pharmacist [employer] at [facility] [phone]

## 2020-08-29 ENCOUNTER — Ambulatory Visit: Payer: PRIVATE HEALTH INSURANCE | Admitting: Pharmacist

## 2020-08-29 DIAGNOSIS — E1159 Type 2 diabetes mellitus with other circulatory complications: Secondary | ICD-10-CM

## 2020-08-29 DIAGNOSIS — K7581 Nonalcoholic steatohepatitis (NASH): Secondary | ICD-10-CM

## 2020-08-29 DIAGNOSIS — E1169 Type 2 diabetes mellitus with other specified complication: Secondary | ICD-10-CM

## 2020-08-29 DIAGNOSIS — E782 Mixed hyperlipidemia: Secondary | ICD-10-CM

## 2020-08-29 DIAGNOSIS — E669 Obesity, unspecified: Secondary | ICD-10-CM

## 2020-08-29 NOTE — Patient Instructions (Signed)
Visit Information   Goals Addressed               This Visit's Progress     Patient Stated     Medication Management (pt-stated)        Patient Goals/Self-Care Activities Over the next 90 days, patient will:  - take medications as prescribed check glucose twice daily, document, and provide at future appointments target a minimum of 150 minutes of moderate intensity exercise weekly engage in dietary modifications by reducing carbohydrate serving sizes         Patient verbalizes understanding of instructions provided today and agrees to view in [place].  Plan: Telephone follow up appointment with care management team member scheduled for:  pending medication access plan  [name] Darnelle Maffucci, PharmD, [place], [place] Clinical Pharmacist [employer] at [facility] [phone]

## 2020-08-29 NOTE — Chronic Care Management (AMB) (Signed)
Care Management   Pharmacy Note  08/29/2020 Name: Christina Stafford MRN: [id number] DOB: 1970/10/13  Subjective: Christina Stafford is a 50 y.o. year old female who is a primary care patient of Crecencio Mc, MD. The Care Management team was consulted for assistance with care management and care coordination needs.    Care coordination  for  medication access  in response to provider referral for pharmacy case management and/or care coordination services.   The patient was given information about Care Management services today including:  Care Management services includes personalized support from designated clinical staff supervised by the patient's primary care provider, including individualized plan of care and coordination with other care providers. 24/7 contact phone numbers for assistance for urgent and routine care needs. The patient may stop case management services at any time by phone call to the office staff.  Patient agreed to services and consent obtained.  Assessment:  Review of patient status, including review of consultants reports, laboratory and other test data, was performed as part of comprehensive evaluation and provision of chronic care management services.   SDOH (Social Determinants of Health) assessments and interventions performed: none today   Objective:  Lab Results  Component Value Date   CREATININE 0.74 07/10/2020   CREATININE 0.76 02/09/2020   CREATININE 0.76 02/09/2020    Lab Results  Component Value Date   HGBA1C 7.4 (H) 07/10/2020       Component Value Date/Time   CHOL 210 (H) 07/10/2020 0844   TRIG 262.0 (H) 07/10/2020 0844   HDL 44.80 07/10/2020 0844   CHOLHDL 5 07/10/2020 0844   VLDL 52.4 (H) 07/10/2020 0844   LDLCALC 230 (H) 06/07/2015 1003   LDLDIRECT 141.0 07/10/2020 0844     Clinical ASCVD: No  The 10-year ASCVD risk score Mikey Bussing DC Jr., et al., 2013) is: 3.9%   Values used to calculate the score:     Age: 87  years     Sex: Female     Is Non-Hispanic African American: No     Diabetic: Yes     Tobacco smoker: No     Systolic Blood Pressure: 409 mmHg     Is BP treated: Yes     HDL Cholesterol: 44.8 mg/dL     Total Cholesterol: 210 mg/dL     BP Readings from Last 3 Encounters:  07/10/20 112/74  04/26/20 125/86  02/28/20 106/71    Care Plan  No Known Allergies  Medications Reviewed Today     Reviewed by De Hollingshead, RPH-CPP (Pharmacist) on 08/11/20 at 1512  Med List Status: <None>   Medication Order Taking? Sig Documenting Provider Last Dose Status Informant  ALPRAZolam (XANAX) 0.5 MG tablet [id number] No Take 1 tablet (0.5 mg total) by mouth daily as needed for anxiety (or panic attacks).  Patient not taking: Reported on 08/11/2020   Crecencio Mc, MD Not Taking Active   blood glucose meter kit and supplies [id number] Yes Dispense based on patient and insurance preference. Use to check blood sugars twice daily. Crecencio Mc, MD Taking Active   cetirizine (ZYRTEC) 10 MG tablet [id number] Yes Take 10 mg by mouth daily. [provider] Taking Active   esomeprazole (NEXIUM) 20 MG capsule [id number] Yes Take 20 mg by mouth daily. [provider] Taking Active Self  glucose blood test strip [id number] Yes Use to check blood sugars twice daily. Crecencio Mc, MD Taking Active   hydrochlorothiazide (MICROZIDE) 12.5 MG capsule [id number] Yes TAKE 1  CAPSULE(12.5 MG) BY MOUTH DAILY Crecencio Mc, MD Taking Active   levothyroxine (SYNTHROID) 88 MCG tablet [id number] Yes TAKE 1 TABLET EVERY DAY ON EMPTY STOMACHWITH A GLASS OF WATER AT LEAST 30-60 MINBEFORE BREAKFAST Crecencio Mc, MD Taking Active   losartan-hydrochlorothiazide (HYZAAR) 50-12.5 MG tablet [id number] Yes TAKE ONE TABLET (50/12.5 MG) BY MOUTH EVERY DAY Crecencio Mc, MD Taking Active   metFORMIN (GLUCOPHAGE) 500 MG tablet [id number] Yes TAKE ONE TABLET BY MOUTH TWICE DAILY WITH A MEAL Crecencio Mc, MD  Taking Active   metoprolol succinate (TOPROL-XL) 25 MG 24 hr tablet [id number] Yes TAKE ONE TABLET (25 MG) BY MOUTH EVERY DAY Crecencio Mc, MD Taking Active   nystatin cream (MYCOSTATIN) [id number] No Apply 1 application topically 2 (two) times daily.  Patient not taking: Reported on 08/11/2020   Mar Daring, PA-C Not Taking Active   rosuvastatin (CRESTOR) 10 MG tablet [id number] Yes TAKE 1 TABLET(10 MG) BY MOUTH DAILY Crecencio Mc, MD Taking Active   Semaglutide,0.25 or 0.5MG/DOS, (OZEMPIC, 0.25 OR 0.5 MG/DOSE,) 2 MG/1.5ML SOPN [id number] No Inject 0.5 mg into the skin once a week.  Patient not taking: Reported on 08/11/2020   Crecencio Mc, MD Not Taking Active   sertraline (ZOLOFT) 50 MG tablet [id number] Yes TAKE 1 AND 1/2 TABLETS BY MOUTH DAILY Crecencio Mc, MD Taking Active   vitamin E (VITAMIN E) 180 MG (400 UNITS) capsule [id number] Yes Take 400 Units by mouth daily. 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   Major depressive disorder, single episode 11/01/2014   GERD (gastroesophageal reflux disease) 12/01/2012   Generalized anxiety disorder with panic attacks 11/04/2012   Panic disorder without agoraphobia 11/04/2012   History of tobacco abuse 03/26/2011   OSA on CPAP    Carpal tunnel syndrome on both sides    Depression    Hypertension     Conditions to be  addressed/monitored: HTN, HLD, and DMII  Care Plan : Medication Management  Updates made by De Hollingshead, RPH-CPP since 08/29/2020 12:00 AM     Problem: Diabetes, HLD      Long-Range Goal: Disease Progression Progression   Start Date: 08/11/2020  Recent Progress: On track  Priority: High  Note:   Current Barriers:  Unable to achieve control of diabetes, hyperlipidemia   Pharmacist Clinical Goal(s):  Over the next 90 days, patient will achieve control of diabetes as evidenced by A1c  through collaboration with PharmD and provider.   Interventions: 1:1 collaboration with Crecencio Mc, MD regarding development and update of comprehensive plan of care as evidenced by provider attestation and co-signature Inter-disciplinary care team collaboration (see longitudinal plan of care) Comprehensive medication review performed; medication list updated in electronic medical record  SDOH: Her and husband, empty nester. Husband goes to bed early and she stays up and snacks.   Diabetes, Fatty Liver DIsease: Uncontrolled; current treatment: metformin  500 mg BID, discussion about addition of Ozempic, but insurance denied coverage without reason besides "plan exclusion" Current meal patterns: breakfast: scrambled eggs w/ olive oil, sometimes salad, infrequent bacon; lunch sandwich - smaller slices of bread, low carb wrap, [place], cheese, mustard + mayo, potato chips, dinner: does eat out often, steak, shrimp, baked potato and a salad; snacks: bedtime snacks - chips, oreos; drinks: coffee, starbucks cold brew, water,  Current exercise: for the past 2 weeks, walks in the morning on the weekdays ~ 1 mile (total of 60 minutes) Discussed impact of high carb snacks, portion sizes on weight, A1c. Discussed need to minimize carbohydrate portion sizes. PA for Ozempic was previously submitted and was denied due to "drug is a plan exclusion". Called insurance. Was told the covered alternatives are  repaglinide (which is not in the ADA algorithm of treatment), netaglinide  (which is not in the ADA algorithm of treatment), rosiglitazone (which is not available in the [place]), pioglitazone (which is not an appropriate option for this patient given risk of weight gain), glipizide (which is not an appropriate option for this patient given risk of weight gain and hypoglycemia), or glimepiride (which is not an appropriate option for this patient given risk of weight gain and hypoglycemia).  Patient returned appeal documentation. Created appeal letter and will include recent visit notes as well as ADA algorithm of treatment. Will submit to [place]  Hypertension: Controlled per last office visit; current treatment: HCTZ 12.5 mg QAM, losartan/HCTZ 50/12.5, metoprolol succinate 25 mg QAM Current home readings: did not discuss today Will evaluate ambulatory BP readings moving forward  Hyperlipidemia: Uncontrolled; current treatment: rosuvastatin 20 mg (recently increased, LDL 141 on rosuvastatin 10 mg) Reports many members of her family have a history of high cholesterol Current dietary patterns: does like red meats, but has tried to cut back CMP ordered and lab work 6 weeks after dose increase of rosuvastatin. If no liver enzyme elevations after therapy increase, will likely need to maximize rosuvastatin to 40 mg daily. May consider PCSK9i in the future if LDL not at goal to reduce risk of ASCVD.   Hypothyroidism: Controlled per last lab work; current regimen: levothyroxine 88 mcg daily Previously recommended to continue current regimen at this time  Depression Controlled per patient report;  current treatment: sertraline 75 mg daily;  Previously recommended to continue current regimen at this time  GERD: Controlled per patient report; current regimen: esomeprazole 20 mg daily Previously recommended to continue current regimen at this time  Patient Goals/Self-Care Activities Over the next  90 days, patient will:  - take medications as prescribed check glucose twice daily, document, and provide at future appointments target a minimum of 150 minutes of moderate intensity exercise weekly engage in dietary modifications by reducing carbohydrate serving sizes  Follow Up Plan: Telephone follow up appointment with care management team member scheduled for: pending medication access plan       Medication Assistance:  None required.  Patient affirms current coverage meets needs.  Follow Up:  Patient agrees to Care Plan and Follow-up.  Plan: Telephone follow up appointment with care management team member scheduled for:  pending medication access plan  [name] Darnelle Maffucci, PharmD, [place], [place] Clinical Pharmacist [employer] at [facility] [phone]

## 2020-09-06 ENCOUNTER — Ambulatory Visit: Payer: PRIVATE HEALTH INSURANCE | Admitting: Pharmacist

## 2020-09-06 DIAGNOSIS — E1169 Type 2 diabetes mellitus with other specified complication: Secondary | ICD-10-CM

## 2020-09-06 DIAGNOSIS — K7581 Nonalcoholic steatohepatitis (NASH): Secondary | ICD-10-CM

## 2020-09-06 DIAGNOSIS — E669 Obesity, unspecified: Secondary | ICD-10-CM

## 2020-09-06 DIAGNOSIS — E1159 Type 2 diabetes mellitus with other circulatory complications: Secondary | ICD-10-CM

## 2020-09-06 NOTE — Chronic Care Management (AMB) (Signed)
Care Management   Pharmacy Note  09/06/2020 Name: Christina Stafford MRN: [id number] DOB: Mar 18, 1971  Subjective: Christina Stafford is a 50 y.o. year old female who is a primary care patient of Crecencio Mc, MD. The Care Management team was consulted for assistance with care management and care coordination needs.    Care coordination  for follow up visit in response to provider referral for pharmacy case management and/or care coordination services.   The patient was given information about Care Management services today including:  Care Management services includes personalized support from designated clinical staff supervised by the patient's primary care provider, including individualized plan of care and coordination with other care providers. 24/7 contact phone numbers for assistance for urgent and routine care needs. The patient may stop case management services at any time by phone call to the office staff.  Patient agreed to services and consent obtained.  Assessment:  Review of patient status, including review of consultants reports, laboratory and other test data, was performed as part of comprehensive evaluation and provision of chronic care management services.   SDOH (Social Determinants of Health) assessments and interventions performed:  none  Objective:  Lab Results  Component Value Date   CREATININE 0.74 07/10/2020   CREATININE 0.76 02/09/2020   CREATININE 0.76 02/09/2020    Lab Results  Component Value Date   HGBA1C 7.4 (H) 07/10/2020       Component Value Date/Time   CHOL 210 (H) 07/10/2020 0844   TRIG 262.0 (H) 07/10/2020 0844   HDL 44.80 07/10/2020 0844   CHOLHDL 5 07/10/2020 0844   VLDL 52.4 (H) 07/10/2020 0844   LDLCALC 230 (H) 06/07/2015 1003   LDLDIRECT 141.0 07/10/2020 0844     Clinical ASCVD: No  The 10-year ASCVD risk score Mikey Bussing DC Jr., et al., 2013) is: 3.9%   Values used to calculate the score:     Age: 53 years     Sex:  Female     Is Non-Hispanic African American: No     Diabetic: Yes     Tobacco smoker: No     Systolic Blood Pressure: 845 mmHg     Is BP treated: Yes     HDL Cholesterol: 44.8 mg/dL     Total Cholesterol: 210 mg/dL    BP Readings from Last 3 Encounters:  07/10/20 112/74  04/26/20 125/86  02/28/20 106/71    Care Plan  No Known Allergies  Medications Reviewed Today     Reviewed by De Hollingshead, RPH-CPP (Pharmacist) on 08/11/20 at 1512  Med List Status: <None>   Medication Order Taking? Sig Documenting Provider Last Dose Status Informant  ALPRAZolam (XANAX) 0.5 MG tablet [id number] No Take 1 tablet (0.5 mg total) by mouth daily as needed for anxiety (or panic attacks).  Patient not taking: Reported on 08/11/2020   Crecencio Mc, MD Not Taking Active   blood glucose meter kit and supplies [id number] Yes Dispense based on patient and insurance preference. Use to check blood sugars twice daily. Crecencio Mc, MD Taking Active   cetirizine (ZYRTEC) 10 MG tablet [id number] Yes Take 10 mg by mouth daily. [provider] Taking Active   esomeprazole (NEXIUM) 20 MG capsule [id number] Yes Take 20 mg by mouth daily. [provider] Taking Active Self  glucose blood test strip [id number] Yes Use to check blood sugars twice daily. Crecencio Mc, MD Taking Active   hydrochlorothiazide (MICROZIDE) 12.5 MG capsule [id number] Yes TAKE 1 CAPSULE(12.5 MG) BY  MOUTH DAILY Crecencio Mc, MD Taking Active   levothyroxine (SYNTHROID) 88 MCG tablet [id number] Yes TAKE 1 TABLET EVERY DAY ON EMPTY STOMACHWITH A GLASS OF WATER AT LEAST 30-60 MINBEFORE BREAKFAST Crecencio Mc, MD Taking Active   losartan-hydrochlorothiazide (HYZAAR) 50-12.5 MG tablet [id number] Yes TAKE ONE TABLET (50/12.5 MG) BY MOUTH EVERY DAY Crecencio Mc, MD Taking Active   metFORMIN (GLUCOPHAGE) 500 MG tablet [id number] Yes TAKE ONE TABLET BY MOUTH TWICE DAILY WITH A MEAL Crecencio Mc, MD Taking Active    metoprolol succinate (TOPROL-XL) 25 MG 24 hr tablet [id number] Yes TAKE ONE TABLET (25 MG) BY MOUTH EVERY DAY Crecencio Mc, MD Taking Active   nystatin cream (MYCOSTATIN) [id number] No Apply 1 application topically 2 (two) times daily.  Patient not taking: Reported on 08/11/2020   Mar Daring, PA-C Not Taking Active   rosuvastatin (CRESTOR) 10 MG tablet [id number] Yes TAKE 1 TABLET(10 MG) BY MOUTH DAILY Crecencio Mc, MD Taking Active   Semaglutide,0.25 or 0.5MG/DOS, (OZEMPIC, 0.25 OR 0.5 MG/DOSE,) 2 MG/1.5ML SOPN [id number] No Inject 0.5 mg into the skin once a week.  Patient not taking: Reported on 08/11/2020   Crecencio Mc, MD Not Taking Active   sertraline (ZOLOFT) 50 MG tablet [id number] Yes TAKE 1 AND 1/2 TABLETS BY MOUTH DAILY Crecencio Mc, MD Taking Active   vitamin E (VITAMIN E) 180 MG (400 UNITS) capsule [id number] Yes Take 400 Units by mouth daily. 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   Major depressive disorder, single episode 11/01/2014   GERD (gastroesophageal reflux disease) 12/01/2012   Generalized anxiety disorder with panic attacks 11/04/2012   Panic disorder without agoraphobia 11/04/2012   History of tobacco abuse 03/26/2011   OSA on CPAP    Carpal tunnel syndrome on both sides    Depression    Hypertension     Conditions to be addressed/monitored: HTN,  HLD, and DMII  Care Plan : Medication Management  Updates made by De Hollingshead, RPH-CPP since 09/06/2020 12:00 AM     Problem: Diabetes, HLD      Long-Range Goal: Disease Progression Progression   Start Date: 08/11/2020  This Visit's Progress: On track  Recent Progress: On track  Priority: High  Note:   Current Barriers:  Unable to achieve control of diabetes, hyperlipidemia   Pharmacist Clinical Goal(s):  Over the next 90 days, patient will achieve control of diabetes as evidenced by A1c  through collaboration with PharmD and provider.   Interventions: 1:1 collaboration with Crecencio Mc, MD regarding development and update of comprehensive plan of care as evidenced by provider attestation and co-signature Inter-disciplinary care team collaboration (see longitudinal plan of care) Comprehensive medication review performed; medication list updated in electronic medical record  SDOH: Her and husband, empty nester. Husband goes to bed early and she stays up and snacks.   Diabetes, Fatty Liver DIsease: Uncontrolled;  current treatment: metformin 500 mg BID, discussion about addition of Ozempic, but insurance denied coverage without reason besides "plan exclusion" Submitted appeal 08/30/20 to [place]. Contacted [employer] today to follow up. They note that the request moved into the processing stage on 6/20 and it can take up to 30 days. Will follow up with patient/[employer] if I have not heard back 30 business days after 6/20.  Hypertension: Controlled per last office visit; current treatment: HCTZ 12.5 mg QAM, losartan/HCTZ 50/12.5, metoprolol succinate 25 mg QAM Current home readings: did not discuss today Will evaluate ambulatory BP readings moving forward  Hyperlipidemia: Uncontrolled; current treatment: rosuvastatin 20 mg (recently increased, LDL 141 on rosuvastatin 10 mg) Reports many members of her family have a history of high cholesterol Current dietary patterns: does like  red meats, but has tried to cut back CMP ordered and lab work 6 weeks after dose increase of rosuvastatin. If no liver enzyme elevations after therapy increase, will likely need to maximize rosuvastatin to 40 mg daily. May consider PCSK9i in the future if LDL not at goal to reduce risk of ASCVD.   Hypothyroidism: Controlled per last lab work; current regimen: levothyroxine 88 mcg daily Previously recommended to continue current regimen at this time  Depression Controlled per patient report;  current treatment: sertraline 75 mg daily;  Previously recommended to continue current regimen at this time  GERD: Controlled per patient report; current regimen: esomeprazole 20 mg daily Previously recommended to continue current regimen at this time  Patient Goals/Self-Care Activities Over the next 90 days, patient will:  - take medications as prescribed check glucose twice daily, document, and provide at future appointments target a minimum of 150 minutes of moderate intensity exercise weekly engage in dietary modifications by reducing carbohydrate serving sizes  Follow Up Plan: Telephone follow up appointment with care management team member scheduled for: pending medication access plan       Medication Assistance:  None required.  Patient affirms current coverage meets needs.  Follow Up:  Patient agrees to Care Plan and Follow-up.  Plan: Telephone follow up appointment with care management team member scheduled for:  pending medication access plans  [name] Darnelle Maffucci, PharmD, [place], [place] Clinical Pharmacist [employer] at [facility] [phone]

## 2020-09-06 NOTE — Patient Instructions (Signed)
Visit Information  PATIENT GOALS:  Goals Addressed               This Visit's Progress     Patient Stated     Medication Management (pt-stated)        Patient Goals/Self-Care Activities Over the next 90 days, patient will:  - take medications as prescribed check glucose twice daily, document, and provide at future appointments target a minimum of 150 minutes of moderate intensity exercise weekly engage in dietary modifications by reducing carbohydrate serving sizes          Patient verbalizes understanding of instructions provided today and agrees to view in [place].   Plan: Telephone follow up appointment with care management team member scheduled for:  pending medication access plans  [name] Darnelle Maffucci, PharmD, [place], [place] Clinical Pharmacist [employer] at [facility] [phone]

## 2020-09-11 ENCOUNTER — Other Ambulatory Visit: Payer: Self-pay | Admitting: Internal Medicine

## 2020-09-11 LAB — HM DIABETES EYE EXAM

## 2020-09-25 ENCOUNTER — Other Ambulatory Visit: Payer: PRIVATE HEALTH INSURANCE

## 2020-10-03 ENCOUNTER — Other Ambulatory Visit: Payer: Self-pay

## 2020-10-03 ENCOUNTER — Other Ambulatory Visit (INDEPENDENT_AMBULATORY_CARE_PROVIDER_SITE_OTHER): Payer: PRIVATE HEALTH INSURANCE

## 2020-10-03 DIAGNOSIS — E1169 Type 2 diabetes mellitus with other specified complication: Secondary | ICD-10-CM

## 2020-10-03 DIAGNOSIS — E039 Hypothyroidism, unspecified: Secondary | ICD-10-CM | POA: Diagnosis not present

## 2020-10-03 DIAGNOSIS — E782 Mixed hyperlipidemia: Secondary | ICD-10-CM

## 2020-10-03 LAB — LIPID PANEL
Cholesterol: 186 mg/dL (ref 0–200)
HDL: 37.1 mg/dL — ABNORMAL LOW (ref >39.00)
NonHDL: 148.46
Total CHOL/HDL Ratio: 5
Triglycerides: 361 mg/dL — ABNORMAL HIGH (ref 0.0–149.0)
VLDL: 72.2 mg/dL — ABNORMAL HIGH (ref 0.0–40.0)

## 2020-10-03 LAB — COMPREHENSIVE METABOLIC PANEL
ALT: 41 U/L — ABNORMAL HIGH (ref 0–35)
AST: 39 U/L — ABNORMAL HIGH (ref 0–37)
Albumin: 4.7 g/dL (ref 3.5–5.2)
Alkaline Phosphatase: 76 U/L (ref 39–117)
BUN: 16 mg/dL (ref 6–23)
CO2: 30 mEq/L (ref 19–32)
Calcium: 10.1 mg/dL (ref 8.4–10.5)
Chloride: 96 mEq/L (ref 96–112)
Creatinine, Ser: 0.79 mg/dL (ref 0.40–1.20)
GFR: 86.16 mL/min (ref >60.00)
Glucose, Bld: 161 mg/dL — ABNORMAL HIGH (ref 70–99)
Potassium: 3.4 mEq/L — ABNORMAL LOW (ref 3.5–5.1)
Sodium: 138 mEq/L (ref 135–145)
Total Bilirubin: 0.5 mg/dL (ref 0.2–1.2)
Total Protein: 7.5 g/dL (ref 6.0–8.3)

## 2020-10-03 LAB — TSH: TSH: 4.54 u[IU]/mL (ref 0.35–5.50)

## 2020-10-03 LAB — LDL CHOLESTEROL, DIRECT: Direct LDL: 107 mg/dL

## 2020-10-10 ENCOUNTER — Ambulatory Visit: Payer: PRIVATE HEALTH INSURANCE | Admitting: Pharmacist

## 2020-10-10 DIAGNOSIS — K7581 Nonalcoholic steatohepatitis (NASH): Secondary | ICD-10-CM

## 2020-10-10 DIAGNOSIS — E782 Mixed hyperlipidemia: Secondary | ICD-10-CM

## 2020-10-10 DIAGNOSIS — E1169 Type 2 diabetes mellitus with other specified complication: Secondary | ICD-10-CM

## 2020-10-10 NOTE — Patient Instructions (Signed)
Visit Information   Goals Addressed               This Visit's Progress     Patient Stated     COMPLETED: Medication Management (pt-stated)        Patient Goals/Self-Care Activities Over the next 90 days, patient will:  - take medications as prescribed check glucose twice daily, document, and provide at future appointments target a minimum of 150 minutes of moderate intensity exercise weekly engage in dietary modifications by reducing carbohydrate serving sizes         Patient verbalizes understanding of instructions provided today and agrees to view in [place].   Plan: Patient will outreach the clinic if further care management support is required.    [name] Darnelle Maffucci, PharmD, [place], [place] Clinical Pharmacist [employer] at [place]

## 2020-10-10 NOTE — Chronic Care Management (AMB) (Signed)
Care Management   Pharmacy Note  10/10/2020 Name: Christina Stafford MRN: [id number] DOB: Aug 23, 1970  Subjective: Christina Stafford is a 50 y.o. year old female who is a primary care patient of Crecencio Mc, MD. The Care Management team was consulted for assistance with care management and care coordination needs.    Engaged with patient by telephone for  medication access follow up  in response to provider referral for pharmacy case management and/or care coordination services.   The patient was given information about Care Management services today including:  Care Management services includes personalized support from designated clinical staff supervised by the patient's primary care provider, including individualized plan of care and coordination with other care providers. 24/7 contact phone numbers for assistance for urgent and routine care needs. The patient may stop case management services at any time by phone call to the office staff.  Patient agreed to services and consent obtained.  Assessment:  Review of patient status, including review of consultants reports, laboratory and other test data, was performed as part of comprehensive evaluation and provision of chronic care management services.   SDOH (Social Determinants of Health) assessments and interventions performed:    Objective:  Lab Results  Component Value Date   CREATININE 0.79 10/03/2020   CREATININE 0.74 07/10/2020   CREATININE 0.76 02/09/2020   CREATININE 0.76 02/09/2020    Lab Results  Component Value Date   HGBA1C 7.4 (H) 07/10/2020       Component Value Date/Time   CHOL 186 10/03/2020 0734   TRIG 361.0 (H) 10/03/2020 0734   HDL 37.10 (L) 10/03/2020 0734   CHOLHDL 5 10/03/2020 0734   VLDL 72.2 (H) 10/03/2020 0734   LDLCALC 230 (H) 06/07/2015 1003   LDLDIRECT 107.0 10/03/2020 0734     Clinical ASCVD: No  The 10-year ASCVD risk score Mikey Bussing DC Jr., et al., 2013) is: 4.2%   Values  used to calculate the score:     Age: 25 years     Sex: Female     Is Non-Hispanic African American: No     Diabetic: Yes     Tobacco smoker: No     Systolic Blood Pressure: 223 mmHg     Is BP treated: Yes     HDL Cholesterol: 37.1 mg/dL     Total Cholesterol: 186 mg/dL      BP Readings from Last 3 Encounters:  07/10/20 112/74  04/26/20 125/86  02/28/20 106/71    Care Plan  No Known Allergies  Medications Reviewed Today     Reviewed by De Hollingshead, RPH-CPP (Pharmacist) on 08/11/20 at 1512  Med List Status: <None>   Medication Order Taking? Sig Documenting Provider Last Dose Status Informant  ALPRAZolam (XANAX) 0.5 MG tablet [id number] No Take 1 tablet (0.5 mg total) by mouth daily as needed for anxiety (or panic attacks).  Patient not taking: Reported on 08/11/2020   Crecencio Mc, MD Not Taking Active   blood glucose meter kit and supplies [id number] Yes Dispense based on patient and insurance preference. Use to check blood sugars twice daily. Crecencio Mc, MD Taking Active   cetirizine (ZYRTEC) 10 MG tablet [id number] Yes Take 10 mg by mouth daily. [provider] Taking Active   esomeprazole (NEXIUM) 20 MG capsule [id number] Yes Take 20 mg by mouth daily. [provider] Taking Active Self  glucose blood test strip [id number] Yes Use to check blood sugars twice daily. Crecencio Mc, MD Taking Active  hydrochlorothiazide (MICROZIDE) 12.5 MG capsule [id number] Yes TAKE 1 CAPSULE(12.5 MG) BY MOUTH DAILY Crecencio Mc, MD Taking Active   levothyroxine (SYNTHROID) 88 MCG tablet [id number] Yes TAKE 1 TABLET EVERY DAY ON EMPTY STOMACHWITH A GLASS OF WATER AT LEAST 30-60 MINBEFORE BREAKFAST Crecencio Mc, MD Taking Active   losartan-hydrochlorothiazide (HYZAAR) 50-12.5 MG tablet [id number] Yes TAKE ONE TABLET (50/12.5 MG) BY MOUTH EVERY DAY Crecencio Mc, MD Taking Active   metFORMIN (GLUCOPHAGE) 500 MG tablet [id number] Yes TAKE ONE TABLET BY MOUTH  TWICE DAILY WITH A MEAL Crecencio Mc, MD Taking Active   metoprolol succinate (TOPROL-XL) 25 MG 24 hr tablet [id number] Yes TAKE ONE TABLET (25 MG) BY MOUTH EVERY DAY Crecencio Mc, MD Taking Active   nystatin cream (MYCOSTATIN) [id number] No Apply 1 application topically 2 (two) times daily.  Patient not taking: Reported on 08/11/2020   Mar Daring, PA-C Not Taking Active   rosuvastatin (CRESTOR) 10 MG tablet [id number] Yes TAKE 1 TABLET(10 MG) BY MOUTH DAILY Crecencio Mc, MD Taking Active   Semaglutide,0.25 or 0.5MG/DOS, (OZEMPIC, 0.25 OR 0.5 MG/DOSE,) 2 MG/1.5ML SOPN [id number] No Inject 0.5 mg into the skin once a week.  Patient not taking: Reported on 08/11/2020   Crecencio Mc, MD Not Taking Active   sertraline (ZOLOFT) 50 MG tablet [id number] Yes TAKE 1 AND 1/2 TABLETS BY MOUTH DAILY Crecencio Mc, MD Taking Active   vitamin E (VITAMIN E) 180 MG (400 UNITS) capsule [id number] Yes Take 400 Units by mouth daily. 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   Major depressive disorder, single episode 11/01/2014   GERD (gastroesophageal reflux disease) 12/01/2012   Generalized anxiety disorder with panic attacks 11/04/2012   Panic disorder without agoraphobia 11/04/2012   History of tobacco abuse 03/26/2011   OSA on CPAP    Carpal tunnel syndrome on both sides    Depression     Hypertension     Conditions to be addressed/monitored: HTN, HLD, and DMII  Care Plan : Medication Management  Updates made by De Hollingshead, RPH-CPP since 10/10/2020 12:00 AM  Completed 10/10/2020   Problem: Diabetes, HLD Resolved 10/10/2020     Long-Range Goal: Disease Progression Progression Completed 10/10/2020  Start Date: 08/11/2020  This Visit's Progress: On track  Recent Progress: On track  Priority: High  Note:   Current Barriers:  Unable to achieve control of diabetes, hyperlipidemia   Pharmacist Clinical Goal(s):  Over the next 90 days, patient will achieve control of diabetes as evidenced by A1c  through collaboration with PharmD and provider.   Interventions: 1:1 collaboration with Crecencio Mc, MD regarding development and update of comprehensive plan of care as evidenced by provider attestation and co-signature Inter-disciplinary care team collaboration (see longitudinal plan of care) Comprehensive medication review performed; medication list updated in electronic medical record  Health Maintenance: Reports she is traveling to [place] (daughter  is studying abroad) and wonders about various required vaccines. Reviewed CDC website. Routine vaccinations, Hep and and Hep B are recommended. Notified patient. Will discuss w/ PCP for order.   SDOH: Her and husband, empty nester. Husband goes to bed early and she stays up and snacks.   Diabetes, Fatty Liver DIsease: Uncontrolled; current treatment: metformin 500 mg BID Insurance denied coverage of Ozempic as she had not tried max does metformin, sulfonlyurea, pioglitazone, and basal insulin.  Current glucose readings: post prandials: 130s Current physical activity: joined a gym. Working out 3-4 days a week and walking daily with her sister.  Current meal patterns: has a meal planning guide from gym Reviewed goal A1c, goal fasting, goal 2 hour post prandial glucose readings.  Recommended to continue current regimen at  this time.   Hypertension: Controlled per last office visit; current treatment: HCTZ 12.5 mg QAM, losartan/HCTZ 50/12.5, metoprolol succinate 25 mg QAM Current home readings: 120/80s Does report some dizziness, though notes she has anxiety at baseline. Cannot pinpoint positional changes.  Recommended to continue current regimen at this time along with home BP readings  Hyperlipidemia: Improved per last lab work, close to goal <100 for DM primary prevention; current treatment: rosuvastatin 20 mg Slight LFT elevations but similar to baseline and more likely related to fatty liver disease.  Recommended to continue current regimen at this time.   Hypothyroidism: Controlled per last lab work; current regimen: levothyroxine 88 mcg daily Previously recommended to continue current regimen at this time  Depression Controlled per patient report;  current treatment: sertraline 75 mg daily;  Previously recommended to continue current regimen at this time  GERD: Controlled per patient report; current regimen: esomeprazole 20 mg daily Previously recommended to continue current regimen at this time  Patient Goals/Self-Care Activities Over the next 90 days, patient will:  - take medications as prescribed check glucose twice daily, document, and provide at future appointments target a minimum of 150 minutes of moderate intensity exercise weekly engage in dietary modifications by reducing carbohydrate serving sizes  Follow Up Plan: Patient declines scheduled follow up.       Medication Assistance:  None required.  Patient affirms current coverage meets needs.  Follow Up:  Patient requests no follow-up at this time.  Plan: Patient will outreach the clinic if further care management support is required.   [name] Darnelle Maffucci, PharmD, [place], [place] Clinical Pharmacist [employer] [facility] [place]

## 2020-10-31 ENCOUNTER — Other Ambulatory Visit: Payer: Self-pay | Admitting: Internal Medicine

## 2020-11-07 ENCOUNTER — Other Ambulatory Visit: Payer: Self-pay | Admitting: Internal Medicine

## 2020-11-07 DIAGNOSIS — E782 Mixed hyperlipidemia: Secondary | ICD-10-CM

## 2020-11-29 ENCOUNTER — Other Ambulatory Visit: Payer: Self-pay

## 2020-11-29 ENCOUNTER — Ambulatory Visit (INDEPENDENT_AMBULATORY_CARE_PROVIDER_SITE_OTHER): Payer: PRIVATE HEALTH INSURANCE | Admitting: Internal Medicine

## 2020-11-29 VITALS — BP 144/92 | HR 67 | Temp 97.6°F | Ht 63.0 in | Wt 211.8 lb

## 2020-11-29 DIAGNOSIS — E1169 Type 2 diabetes mellitus with other specified complication: Secondary | ICD-10-CM | POA: Diagnosis not present

## 2020-11-29 DIAGNOSIS — I10 Essential (primary) hypertension: Secondary | ICD-10-CM | POA: Diagnosis not present

## 2020-11-29 DIAGNOSIS — E669 Obesity, unspecified: Secondary | ICD-10-CM

## 2020-11-29 DIAGNOSIS — E1159 Type 2 diabetes mellitus with other circulatory complications: Secondary | ICD-10-CM

## 2020-11-29 DIAGNOSIS — E538 Deficiency of other specified B group vitamins: Secondary | ICD-10-CM | POA: Diagnosis not present

## 2020-11-29 DIAGNOSIS — Z23 Encounter for immunization: Secondary | ICD-10-CM

## 2020-11-29 LAB — LIPID PANEL
Cholesterol: 200 mg/dL (ref 0–200)
HDL: 42.2 mg/dL (ref >39.00)
NonHDL: 157.42
Total CHOL/HDL Ratio: 5
Triglycerides: 293 mg/dL — ABNORMAL HIGH (ref 0.0–149.0)
VLDL: 58.6 mg/dL — ABNORMAL HIGH (ref 0.0–40.0)

## 2020-11-29 LAB — HEMOGLOBIN A1C: Hgb A1c MFr Bld: 7.4 % — ABNORMAL HIGH (ref 4.6–6.5)

## 2020-11-29 LAB — COMPREHENSIVE METABOLIC PANEL
ALT: 40 U/L — ABNORMAL HIGH (ref 0–35)
AST: 32 U/L (ref 0–37)
Albumin: 4.5 g/dL (ref 3.5–5.2)
Alkaline Phosphatase: 85 U/L (ref 39–117)
BUN: 14 mg/dL (ref 6–23)
CO2: 28 mEq/L (ref 19–32)
Calcium: 10.5 mg/dL (ref 8.4–10.5)
Chloride: 96 mEq/L (ref 96–112)
Creatinine, Ser: 0.66 mg/dL (ref 0.40–1.20)
GFR: 100.93 mL/min (ref >60.00)
Glucose, Bld: 112 mg/dL — ABNORMAL HIGH (ref 70–99)
Potassium: 3.6 mEq/L (ref 3.5–5.1)
Sodium: 137 mEq/L (ref 135–145)
Total Bilirubin: 0.3 mg/dL (ref 0.2–1.2)
Total Protein: 7.3 g/dL (ref 6.0–8.3)

## 2020-11-29 LAB — LDL CHOLESTEROL, DIRECT: Direct LDL: 125 mg/dL

## 2020-11-29 LAB — VITAMIN B12: Vitamin B-12: 259 pg/mL (ref 211–911)

## 2020-11-29 MED ORDER — TIRZEPATIDE 2.5 MG/0.5ML ~~LOC~~ SOAJ: 2.5000 mg | SUBCUTANEOUS | 2 refills | Status: DC

## 2020-11-29 NOTE — Assessment & Plan Note (Signed)
I have congratulated her in reduction of   BMI and encouraged  Continued weight loss with goal of 10% of body weigh over the next 6 months with continued use of a low glycemic index diet and regular exercise a minimum of 5 days per week. Prescribing Mounjaro to add to regimen of metformin.  Starting dose 2.5 mg weekly.  Continue ARB and rosuvastatin

## 2020-11-29 NOTE — Progress Notes (Signed)
Subjective:  Patient ID: Christina Stafford, female    DOB: 09/04/1970  Age: 50 y.o. MRN: [id number]  CC: The primary encounter diagnosis was Need for immunization against influenza. Diagnoses of Obesity, diabetes, and hypertension syndrome ([place]) and Primary hypertension were also pertinent to this visit.  HPI Christina Stafford presents for  Chief Complaint  Patient presents with   Follow-up   Obesity/diabetes/hypertension: Phas NOT been taking ozempic  because insurance denied coverage.  On her own she has lost 13  lbs  using metformin,  low GI diet and daily walking 1-3 miles daily. .  Last labs done in April BS readings   167 this morning before eating, 137 on a 2nd try on an alternate finger.   She went online to a website supposedly managed by physicians and requested a medication for weight loss.  She was sent zonegran 100 mg capsules with dose titration instructions but NO discussion of the risks of medication,  which include Kathreen Cosier Syndrome.  She has not started the medication .  Discussed alternative to Ozempic [facility]) and use of the NOOM app   Blood pressure readings at home on losartan 50 mg /hctz 25 mg  and metoprolol  succinate 25 mg daily have been checked rarely   Lab Results  Component Value Date   HGBA1C 7.4 (H) 07/10/2020      Outpatient Medications Prior to Visit  Medication Sig Dispense Refill   ALPRAZolam (XANAX) 0.5 MG tablet Take 1 tablet (0.5 mg total) by mouth daily as needed for anxiety (or panic attacks). 30 tablet 0   blood glucose meter kit and supplies Dispense based on patient and insurance preference. Use to check blood sugars twice daily. 1 each 0   cetirizine (ZYRTEC) 10 MG tablet Take 10 mg by mouth daily.     esomeprazole (NEXIUM) 20 MG capsule Take 20 mg by mouth daily.     glucose blood test strip Use to check blood sugars twice daily. 100 each 5   hydrochlorothiazide (MICROZIDE) 12.5 MG capsule TAKE 1 CAPSULE (12.5 MG) BY  MOUTH EVERY DAY 90 capsule 1   levothyroxine (SYNTHROID) 88 MCG tablet TAKE 1 TABLET EVERY DAY ON EMPTY STOMACHWITH A GLASS OF WATER AT LEAST 30-60 MINBEFORE BREAKFAST 90 tablet 1   losartan-hydrochlorothiazide (HYZAAR) 50-12.5 MG tablet TAKE 1 TABLET BY MOUTH DAILY 90 tablet 0   metFORMIN (GLUCOPHAGE) 500 MG tablet TAKE ONE TABLET BY MOUTH TWICE DAILY WITH A MEAL 180 tablet 1   metoprolol succinate (TOPROL-XL) 25 MG 24 hr tablet TAKE ONE TABLET (25 MG) BY MOUTH EVERY DAY 90 tablet 1   nystatin cream (MYCOSTATIN) Apply 1 application topically 2 (two) times daily. 30 g 0   rosuvastatin (CRESTOR) 20 MG tablet TAKE 1 TABLET BY MOUTH DAILY. 90 tablet 1   sertraline (ZOLOFT) 50 MG tablet TAKE ONE AND A HALF TABLETS (75 MG) BY MOUTH EVERY DAY 45 tablet 5   vitamin E 180 MG (400 UNITS) capsule Take 400 Units by mouth daily.     Semaglutide,0.25 or 0.5MG/DOS, (OZEMPIC, 0.25 OR 0.5 MG/DOSE,) 2 MG/1.5ML SOPN Inject 0.5 mg into the skin once a week. 1.5 mL 1   No facility-administered medications prior to visit.    Review of Systems;  Patient denies headache, fevers, malaise, unintentional weight loss, skin rash, eye pain, sinus congestion and sinus pain, sore throat, dysphagia,  hemoptysis , cough, dyspnea, wheezing, chest pain, palpitations, orthopnea, edema, abdominal pain, nausea, melena, diarrhea, constipation, flank pain, dysuria,  hematuria, urinary  Frequency, nocturia, numbness, tingling, seizures,  Focal weakness, Loss of consciousness,  Tremor, insomnia, depression, anxiety, and suicidal ideation.      Objective:  BP (!) 144/92   Pulse 67   Temp 97.6 F (36.4 C) (Skin)   Ht 5' 3"  (1.6 m)   Wt 211 lb 12.8 oz (96.1 kg)   LMP 01/20/2017 (Approximate)   SpO2 98%   BMI 37.52 kg/m   BP Readings from Last 3 Encounters:  11/29/20 (!) 144/92  07/10/20 112/74  04/26/20 125/86    Wt Readings from Last 3 Encounters:  11/29/20 211 lb 12.8 oz (96.1 kg)  07/10/20 217 lb 9.6 oz (98.7 kg)   04/26/20 216 lb 8 oz (98.2 kg)    General appearance: alert, cooperative and appears stated age Ears: normal TM's and external ear canals both ears Throat: lips, mucosa, and tongue normal; teeth and gums normal Neck: no adenopathy, no carotid bruit, supple, symmetrical, trachea midline and thyroid not enlarged, symmetric, no tenderness/mass/nodules Back: symmetric, no curvature. ROM normal. No CVA tenderness. Lungs: clear to auscultation bilaterally Heart: regular rate and rhythm, S1, S2 normal, no murmur, click, rub or gallop Abdomen: soft, non-tender; bowel sounds normal; no masses,  no organomegaly Pulses: 2+ and symmetric Skin: Skin color, texture, turgor normal. No rashes or lesions Lymph nodes: Cervical, supraclavicular, and axillary nodes normal.  Lab Results  Component Value Date   HGBA1C 7.4 (H) 07/10/2020   HGBA1C 6.7 (H) 02/09/2020   HGBA1C 7.1 (H) 11/08/2019    Lab Results  Component Value Date   CREATININE 0.79 10/03/2020   CREATININE 0.74 07/10/2020   CREATININE 0.76 02/09/2020   CREATININE 0.76 02/09/2020    Lab Results  Component Value Date   WBC 5.9 02/07/2020   HGB 13.2 02/07/2020   HCT 39.0 02/07/2020   PLT 289 02/07/2020   GLUCOSE 161 (H) 10/03/2020   CHOL 186 10/03/2020   TRIG 361.0 (H) 10/03/2020   HDL 37.10 (L) 10/03/2020   LDLDIRECT 107.0 10/03/2020   LDLCALC 230 (H) 06/07/2015   ALT 41 (H) 10/03/2020   AST 39 (H) 10/03/2020   NA 138 10/03/2020   K 3.4 (L) 10/03/2020   CL 96 10/03/2020   CREATININE 0.79 10/03/2020   BUN 16 10/03/2020   CO2 30 10/03/2020   TSH 4.54 10/03/2020   INR 0.9 02/07/2020   HGBA1C 7.4 (H) 07/10/2020   MICROALBUR <0.7 07/10/2020    No results found.  Assessment & Plan:   Problem List Items Addressed This Visit       Unprioritized   Hypertension    she reports compliance with medication regimen  but has an elevated reading today in office.  She is not using NSAIDs daily.  Discussed goal of 120/70  (130/80  for patients over 70)  to preserve renal function.  She has been asked to check her  BP  at home and  submit readings for evaluation. Renal function, electrolytes and screen for proteinuria have been  normal .      Obesity, diabetes, and hypertension syndrome ([place])    I have congratulated her in reduction of   BMI and encouraged  Continued weight loss with goal of 10% of body weigh over the next 6 months with continued use of a low glycemic index diet and regular exercise a minimum of 5 days per week. Prescribing Mounjaro to add to regimen of metformin.  Starting dose 2.5 mg weekly.  Continue ARB and rosuvastatin  Relevant Medications   tirzepatide (MOUNJARO) 2.5 MG/0.5ML Pen   Other Relevant Orders   Vitamin B12   Comprehensive metabolic panel   Hemoglobin A1c   LDL cholesterol, direct   Lipid panel   Other Visit Diagnoses     Need for immunization against influenza    -  Primary   Relevant Orders   Flu Vaccine QUAD 43moIM (Fluarix, Fluzone & Alfiuria Quad PF) (Completed)       I spent 30 minutes dedicated to the care of this patient on the date of this encounter to include pre-visit review of his medical history,  Face-to-face time with the patient , and post visit ordering of testing and therapeutics.  Meds ordered this encounter  Medications   tirzepatide (MOUNJARO) 2.5 MG/0.5ML Pen    Sig: Inject 2.5 mg into the skin once a week.    Dispense:  2 mL    Refill:  2    Medications Discontinued During This Encounter  Medication Reason   Semaglutide,0.25 or 0.5MG/DOS, (OZEMPIC, 0.25 OR 0.5 MG/DOSE,) 2 MG/1.5ML SOPN     Follow-up: No follow-ups on file.   TCrecencio Mc MD

## 2020-11-29 NOTE — Patient Instructions (Signed)
  I am prescribing  Mounjaro. You no longer have to go through [name] to receive the medication   You can download  the $25 copay card on their website: [email or web address]  Darcel Bayley is a medication that is taken as a weekly subcutaneous injection. It is not insulin.  It  causes your pancreas to increase its  own insulin secretion  And also slows down the emptying of your stomach,  So it decreases your appetite and helps you lose weight.  The dose for the first 4 weekly doses is 2.5 mg .  You may have mild nausea on the first or second day but this should resolve.  If not  ,  stop the medication.   As long as you are losing weight,  you can continue the dose you are on .  Only increase the dose to  5.0 mg  after 4 weeks if your weight has plateaued.  Let me know when you need a refill and what dose you are taking.     Also:  Please check your blood pressure a few times at home and send me the readings so I can determine if you need to add another  medication to lower your blood pressure .

## 2020-11-29 NOTE — Assessment & Plan Note (Signed)
she reports compliance with medication regimen  but has an elevated reading today in office.  She is not using NSAIDs daily.  Discussed goal of 120/70  (130/80 for patients over 70)  to preserve renal function.  She has been asked to check her  BP  at home and  submit readings for evaluation. Renal function, electrolytes and screen for proteinuria have been  normal .

## 2020-12-04 ENCOUNTER — Telehealth: Payer: Self-pay | Admitting: Internal Medicine

## 2020-12-04 MED ORDER — TIRZEPATIDE 2.5 MG/0.5ML ~~LOC~~ SOAJ: 2.5000 mg | SUBCUTANEOUS | 2 refills | Status: DC

## 2020-12-04 NOTE — Addendum Note (Signed)
Addended by: Adair Laundry on: 12/04/2020 09:57 AM   Modules accepted: Orders

## 2020-12-04 NOTE — Addendum Note (Signed)
Addended by: Crecencio Mc on: 12/04/2020 01:29 PM   Modules accepted: Orders

## 2020-12-04 NOTE — Telephone Encounter (Signed)
Patient is calling in to see if she can have her b-12 injections back to back because [place].She also received a message in her MyChart to co-schedule a lab but she is unsure what this means,please advise.

## 2020-12-05 ENCOUNTER — Telehealth: Payer: Self-pay | Admitting: Pharmacist

## 2020-12-05 NOTE — Telephone Encounter (Signed)
Spoke with pt and scheduled her for her one more b12 injection before she goes on vacation. Also let the pt know that she will need to take 2500 mcg of b12 daily while on vacation. Pt gave a verbal understanding.

## 2020-12-05 NOTE — Telephone Encounter (Signed)
PA request for [facility] received via Cover My Meds. PA is not required, as savings card patient downloaded will provide coverage of the medication if insurance does not. Contacted patient, explained this. She believes she can pick up the medication today. Advised to let me know if any issues. Scheduled follow up in ~ 6 weeks.

## 2020-12-05 NOTE — Telephone Encounter (Signed)
First b12 injection is 12/07/2020 should pt just wait until she returns to get the next b12 injection?

## 2020-12-07 ENCOUNTER — Other Ambulatory Visit: Payer: Self-pay

## 2020-12-07 ENCOUNTER — Ambulatory Visit (INDEPENDENT_AMBULATORY_CARE_PROVIDER_SITE_OTHER): Payer: PRIVATE HEALTH INSURANCE

## 2020-12-07 DIAGNOSIS — E538 Deficiency of other specified B group vitamins: Secondary | ICD-10-CM | POA: Diagnosis not present

## 2020-12-07 MED ORDER — CYANOCOBALAMIN 1000 MCG/ML IJ SOLN
1000.0000 ug | Freq: Once | INTRAMUSCULAR | Status: AC
  Administered 2020-12-07: 1000 ug via INTRAMUSCULAR

## 2020-12-07 NOTE — Progress Notes (Signed)
Patient presented for B 12 injection to right deltoid, patient voiced no concerns nor showed any signs of distress during injection. 

## 2020-12-11 ENCOUNTER — Other Ambulatory Visit: Payer: Self-pay | Admitting: Internal Medicine

## 2020-12-14 ENCOUNTER — Other Ambulatory Visit: Payer: Self-pay

## 2020-12-14 ENCOUNTER — Ambulatory Visit (INDEPENDENT_AMBULATORY_CARE_PROVIDER_SITE_OTHER): Payer: PRIVATE HEALTH INSURANCE

## 2020-12-14 DIAGNOSIS — E538 Deficiency of other specified B group vitamins: Secondary | ICD-10-CM

## 2020-12-14 MED ORDER — CYANOCOBALAMIN 1000 MCG/ML IJ SOLN
1000.0000 ug | Freq: Once | INTRAMUSCULAR | Status: AC
  Administered 2020-12-14: 1000 ug via INTRAMUSCULAR

## 2020-12-14 NOTE — Progress Notes (Signed)
Patient presented for B 12 injection to right deltoid, patient voiced no concerns nor showed any signs of distress during injection. 

## 2021-01-10 ENCOUNTER — Ambulatory Visit: Payer: PRIVATE HEALTH INSURANCE | Admitting: Internal Medicine

## 2021-01-10 ENCOUNTER — Other Ambulatory Visit: Payer: Self-pay

## 2021-01-10 ENCOUNTER — Encounter: Payer: Self-pay | Admitting: Internal Medicine

## 2021-01-10 VITALS — BP 108/78 | HR 72 | Temp 95.9°F | Ht 63.0 in | Wt 209.8 lb

## 2021-01-10 DIAGNOSIS — E039 Hypothyroidism, unspecified: Secondary | ICD-10-CM

## 2021-01-10 DIAGNOSIS — I10 Essential (primary) hypertension: Secondary | ICD-10-CM

## 2021-01-10 DIAGNOSIS — E1151 Type 2 diabetes mellitus with diabetic peripheral angiopathy without gangrene: Secondary | ICD-10-CM | POA: Diagnosis not present

## 2021-01-10 MED ORDER — TIRZEPATIDE 5 MG/0.5ML ~~LOC~~ SOAJ: 5.0000 mg | SUBCUTANEOUS | 2 refills | Status: DC

## 2021-01-10 NOTE — Assessment & Plan Note (Addendum)
Well controlled on current regimen of losartan/hct. Renal function stable, no changes today.  Lab Results  Component Value Date   NA 137 11/29/2020   K 3.6 11/29/2020   CL 96 11/29/2020   CO2 28 11/29/2020   Lab Results  Component Value Date   CREATININE 0.66 11/29/2020

## 2021-01-10 NOTE — Assessment & Plan Note (Addendum)
Complicated by hyperlipidemia, hypertension, fatty liver and obesity with OSA .  One month sample of Mounjaro given.   Continue ARB and rosuvastatin.  Low glycemic index diet and regular exercise encouraged .  BP is at goal . LDL is 125 on current statin therapy  Lab Results  Component Value Date   HGBA1C 7.4 (H) 11/29/2020   Lab Results  Component Value Date   CHOL 200 11/29/2020   HDL 42.20 11/29/2020   LDLCALC 230 (H) 06/07/2015   LDLDIRECT 125.0 11/29/2020   TRIG 293.0 (H) 11/29/2020   CHOLHDL 5 11/29/2020   Lab Results  Component Value Date   MICROALBUR <0.7 07/10/2020   MICROALBUR 8.8 (H) 07/23/2019     Lab Results  Component Value Date   ALT 40 (H) 11/29/2020   AST 32 11/29/2020   ALKPHOS 85 11/29/2020   BILITOT 0.3 11/29/2020

## 2021-01-10 NOTE — Progress Notes (Signed)
Subjective:  Patient ID: Christina Stafford, female    DOB: September 13, 1970  Age: 50 y.o. MRN: [id number]  CC: The primary encounter diagnosis was Acquired hypothyroidism. Diagnoses of Morbid obesity ([place]), Primary hypertension, and DM (diabetes mellitus), type 2 with peripheral vascular complications [facility]) were also pertinent to this visit.  HPI Christina Stafford presents for follow up on multiple issues  Chief Complaint  Patient presents with   Follow-up    6 month follow up on diabetes, hypertension, hypothyroidism, hyperlipidemia   This visit occurred during the SARS-CoV-2 public health emergency.  Safety protocols were in place, including screening questions prior to the visit, additional usage of staff PPE, and extensive cleaning of exam room while observing appropriate contact time as indicated for disinfecting solutions.    T2DM:  She  feels generally well,  is exercising regularly and  trying to lose weight. Checking  blood sugars less than once daily at variable times, usually only if she feels she may be having a hypoglycemic event. .  BS have been under 130 fasting and < 160 post prandially.  Denies any recent hypoglyemic events.  Taking   medications as directed. Following a carbohydrate modified diet 6 days per week. Denies numbness, burning and tingling of extremities. Appetite is good.   Insurance would not cover mounjar OR ozempic despite multiple attempts by [employer] our clinical pharmacist,  for unclear reasons     Outpatient Medications Prior to Visit  Medication Sig Dispense Refill   ALPRAZolam (XANAX) 0.5 MG tablet Take 1 tablet (0.5 mg total) by mouth daily as needed for anxiety (or panic attacks). 30 tablet 0   blood glucose meter kit and supplies Dispense based on patient and insurance preference. Use to check blood sugars twice daily. 1 each 0   cetirizine (ZYRTEC) 10 MG tablet Take 10 mg by mouth daily.     esomeprazole (NEXIUM) 20 MG capsule Take 20 mg by  mouth daily.     glucose blood test strip Use to check blood sugars twice daily. 100 each 5   hydrochlorothiazide (MICROZIDE) 12.5 MG capsule TAKE 1 CAPSULE (12.5 MG) BY MOUTH EVERY DAY 90 capsule 1   levothyroxine (SYNTHROID) 88 MCG tablet TAKE 1 TABLET EVERY DAY ON EMPTY STOMACHWITH A GLASS OF WATER AT LEAST 30-60 MINBEFORE BREAKFAST 90 tablet 1   losartan-hydrochlorothiazide (HYZAAR) 50-12.5 MG tablet TAKE 1 TABLET BY MOUTH DAILY 90 tablet 0   metFORMIN (GLUCOPHAGE) 500 MG tablet TAKE ONE TABLET BY MOUTH TWICE DAILY WITH A MEAL 180 tablet 1   metoprolol succinate (TOPROL-XL) 25 MG 24 hr tablet TAKE ONE TABLET (25 MG) BY MOUTH EVERY DAY 90 tablet 1   rosuvastatin (CRESTOR) 20 MG tablet TAKE 1 TABLET BY MOUTH DAILY. 90 tablet 1   sertraline (ZOLOFT) 50 MG tablet TAKE ONE AND A HALF TABLETS (75 MG) BY MOUTH EVERY DAY 45 tablet 5   tirzepatide (MOUNJARO) 2.5 MG/0.5ML Pen Inject 2.5 mg into the skin once a week. 2 mL 2   nystatin cream (MYCOSTATIN) Apply 1 application topically 2 (two) times daily. (Patient not taking: Reported on 01/10/2021) 30 g 0   vitamin E 180 MG (400 UNITS) capsule Take 400 Units by mouth daily. (Patient not taking: Reported on 01/10/2021)     No facility-administered medications prior to visit.    Review of Systems;  Patient denies headache, fevers, malaise, unintentional weight loss, skin rash, eye pain, sinus congestion and sinus pain, sore throat, dysphagia,  hemoptysis , cough, dyspnea,  wheezing, chest pain, palpitations, orthopnea, edema, abdominal pain, nausea, melena, diarrhea, constipation, flank pain, dysuria, hematuria, urinary  Frequency, nocturia, numbness, tingling, seizures,  Focal weakness, Loss of consciousness,  Tremor, insomnia, depression, anxiety, and suicidal ideation.      Objective:  BP 108/78 (BP Location: Left Arm, Patient Position: Sitting, Cuff Size: Normal)   Pulse 72   Temp (!) 95.9 F (35.5 C) (Temporal)   Ht _0  (1.6 m)   Wt 209 lb  12.8 oz (95.2 kg)   LMP 01/20/2017 (Approximate)   SpO2 95%   BMI 37.16 kg/m   BP Readings from Last 3 Encounters:  01/10/21 108/78  11/29/20 (!) 144/92  07/10/20 112/74    Wt Readings from Last 3 Encounters:  01/10/21 209 lb 12.8 oz (95.2 kg)  11/29/20 211 lb 12.8 oz (96.1 kg)  07/10/20 217 lb 9.6 oz (98.7 kg)    General appearance: alert, cooperative and appears stated age Ears: normal TM's and external ear canals both ears Throat: lips, mucosa, and tongue normal; teeth and gums normal Neck: no adenopathy, no carotid bruit, supple, symmetrical, trachea midline and thyroid not enlarged, symmetric, no tenderness/mass/nodules Back: symmetric, no curvature. ROM normal. No CVA tenderness. Lungs: clear to auscultation bilaterally Heart: regular rate and rhythm, S1, S2 normal, no murmur, click, rub or gallop Abdomen: soft, non-tender; bowel sounds normal; no masses,  no organomegaly Pulses: 2+ and symmetric Skin: Skin color, texture, turgor normal. No rashes or lesions Lymph nodes: Cervical, supraclavicular, and axillary nodes normal.  Lab Results  Component Value Date   HGBA1C 7.4 (H) 11/29/2020   HGBA1C 7.4 (H) 07/10/2020   HGBA1C 6.7 (H) 02/09/2020    Lab Results  Component Value Date   CREATININE 0.66 11/29/2020   CREATININE 0.79 10/03/2020   CREATININE 0.74 07/10/2020    Lab Results  Component Value Date   WBC 5.9 02/07/2020   HGB 13.2 02/07/2020   HCT 39.0 02/07/2020   PLT 289 02/07/2020   GLUCOSE 112 (H) 11/29/2020   CHOL 200 11/29/2020   TRIG 293.0 (H) 11/29/2020   HDL 42.20 11/29/2020   LDLDIRECT 125.0 11/29/2020   LDLCALC 230 (H) 06/07/2015   ALT 40 (H) 11/29/2020   AST 32 11/29/2020   NA 137 11/29/2020   K 3.6 11/29/2020   CL 96 11/29/2020   CREATININE 0.66 11/29/2020   BUN 14 11/29/2020   CO2 28 11/29/2020   TSH 4.54 10/03/2020   INR 0.9 02/07/2020   HGBA1C 7.4 (H) 11/29/2020   MICROALBUR <0.7 07/10/2020    No results found.  Assessment &  Plan:   Problem List Items Addressed This Visit     DM (diabetes mellitus), type 2 with peripheral vascular complications ([place])    Complicated by hyperlipidemia, hypertension, fatty liver and obesity with OSA .  One month sample of Mounjaro given.   Continue ARB and rosuvastatin.  Low glycemic index diet and regular exercise encouraged .  BP is at goal . LDL is 125 on current statin therapy  Lab Results  Component Value Date   HGBA1C 7.4 (H) 11/29/2020   Lab Results  Component Value Date   CHOL 200 11/29/2020   HDL 42.20 11/29/2020   LDLCALC 230 (H) 06/07/2015   LDLDIRECT 125.0 11/29/2020   TRIG 293.0 (H) 11/29/2020   CHOLHDL 5 11/29/2020   Lab Results  Component Value Date   MICROALBUR <0.7 07/10/2020   MICROALBUR 8.8 (H) 07/23/2019     Lab Results  Component Value Date   ALT  40 (H) 11/29/2020   AST 32 11/29/2020   ALKPHOS 85 11/29/2020   BILITOT 0.3 11/29/2020          Relevant Medications   tirzepatide [facility]) 5 MG/0.5ML Pen (Start on 02/09/2021)   Other Relevant Orders   Hemoglobin A1c   Comprehensive metabolic panel   Lipid panel   Hypertension    Well controlled on current regimen of losartan/hct. Renal function stable, no changes today.  Lab Results  Component Value Date   NA 137 11/29/2020   K 3.6 11/29/2020   CL 96 11/29/2020   CO2 28 11/29/2020   Lab Results  Component Value Date   CREATININE 0.66 11/29/2020         Hypothyroidism - Primary    Thyroid function is WNL on current dose but TSH has been trending up.  Will repeat with next A1c and increase dose for TSH > 3 Lab Results  Component Value Date   TSH 4.54 10/03/2020         Relevant Orders   TSH   Morbid obesity ([place])    With fatty liver, OSA, type 2 DM, and HTN.        Relevant Medications   tirzepatide [facility]) 5 MG/0.5ML Pen (Start on 02/09/2021)      I provided  30 minutes  during this encounter reviewing patient's current problems and past surgeries, labs and  imaging studies, providing counseling on the above mentioned problems I na face to face visit  , and coordination  of care .  Meds ordered this encounter  Medications   tirzepatide (MOUNJARO) 5 MG/0.5ML Pen    Sig: Inject 5 mg into the skin once a week.    Dispense:  6 mL    Refill:  2    Medications Discontinued During This Encounter  Medication Reason   vitamin E 180 MG (400 UNITS) capsule    nystatin cream (MYCOSTATIN)    tirzepatide (MOUNJARO) 2.5 MG/0.5ML Pen     Follow-up: No follow-ups on file.   Crecencio Mc, MD

## 2021-01-10 NOTE — Assessment & Plan Note (Signed)
With fatty liver, OSA, type 2 DM, and HTN.

## 2021-01-10 NOTE — Patient Instructions (Addendum)
  I recommend  starting Mounjaro. The prescription is for month 2 , and is prescribed  at the next higher dose .  Don't forget to  obtain the $25 copay card on their website: [email or web address].[email or web address]  The dose for the first 4 weekly doses is 2.5 mg .  You may have mild nausea on the first or second day but this should resolve.  If not  ,  stop the medication.   As long as you are losing weight,  you can continue the dose you are on .  Only increase the dose to  5.0 mg  after 4 weeks if your weight has plateaued.  Let me know if you need to stay at the lower dose when you need a refill   Your insurance require s information about your prior sleep study to give you a new CPAP .  We do not have that information on file.  If we cannot find it,  they may require you to have another sleep study

## 2021-01-13 NOTE — Assessment & Plan Note (Addendum)
Thyroid function is WNL on current dose but TSH has been trending up.  Will repeat with next A1c and increase dose for TSH > 3 Lab Results  Component Value Date   TSH 4.54 10/03/2020

## 2021-01-25 ENCOUNTER — Ambulatory Visit: Payer: PRIVATE HEALTH INSURANCE | Admitting: Pharmacist

## 2021-01-25 DIAGNOSIS — E1151 Type 2 diabetes mellitus with diabetic peripheral angiopathy without gangrene: Secondary | ICD-10-CM

## 2021-01-25 MED ORDER — TIRZEPATIDE 5 MG/0.5ML ~~LOC~~ SOAJ: 5.0000 mg | SUBCUTANEOUS | 2 refills | Status: DC

## 2021-01-25 NOTE — Patient Instructions (Signed)
Visit Information Patient Goals/Self-Care Activities Over the next 90 days, patient will:  - take medications as prescribed check glucose twice daily, document, and provide at future appointments target a minimum of 150 minutes of moderate intensity exercise weekly engage in dietary modifications by reducing carbohydrate serving sizes    Patient verbalizes understanding of instructions provided today and agrees to view in [place].   Plan: Telephone follow up appointment with care management team member scheduled for:  tomorrow  [name] Darnelle Maffucci, PharmD, [place], [place] Clinical Pharmacist [employer] at [facility] [phone]

## 2021-01-25 NOTE — Chronic Care Management (AMB) (Addendum)
Chronic Care Management CCM Pharmacy Note  01/25/2021 Name:  Christina Stafford MRN:  [id number] DOB:  1970/12/23  Summary: Christina Stafford script + manufacturer card needs to go to manufacturer card preferred pharmacy.   Recommendations/Changes made from today's visit: - Script sent to [facility] per patient request - Consider statin dose increase moving forward  Subjective: Christina Stafford is an 50 y.o. year old female who is a primary patient of Tullo, Aris Everts, MD.  The CCM team was consulted for assistance with disease management and care coordination needs.    Engaged with patient by telephone for follow up visit for pharmacy case management and/or care coordination services.   Objective:  Medications Reviewed Today     Reviewed by Adair Laundry, [place] (Certified Medical Assistant) on 01/10/21 at [phone]  Med List Status: <None>   Medication Order Taking? Sig Documenting Provider Last Dose Status Informant  ALPRAZolam (XANAX) 0.5 MG tablet [id number] Yes Take 1 tablet (0.5 mg total) by mouth daily as needed for anxiety (or panic attacks). Crecencio Mc, MD Taking Active   blood glucose meter kit and supplies [id number] Yes Dispense based on patient and insurance preference. Use to check blood sugars twice daily. Crecencio Mc, MD Taking Active   cetirizine (ZYRTEC) 10 MG tablet [id number] Yes Take 10 mg by mouth daily. [provider] Taking Active   esomeprazole (NEXIUM) 20 MG capsule [id number] Yes Take 20 mg by mouth daily. [provider] Taking Active Self  glucose blood test strip [id number] Yes Use to check blood sugars twice daily. Crecencio Mc, MD Taking Active   hydrochlorothiazide (MICROZIDE) 12.5 MG capsule [id number] Yes TAKE 1 CAPSULE (12.5 MG) BY MOUTH EVERY DAY Crecencio Mc, MD Taking Active   levothyroxine (SYNTHROID) 88 MCG tablet [id number] Yes TAKE 1 TABLET EVERY DAY ON EMPTY STOMACHWITH A GLASS OF WATER AT LEAST 30-60 MINBEFORE  BREAKFAST Crecencio Mc, MD Taking Active   losartan-hydrochlorothiazide [facility]) 50-12.5 MG tablet [id number] Yes TAKE 1 TABLET BY MOUTH DAILY Crecencio Mc, MD Taking Active   metFORMIN (GLUCOPHAGE) 500 MG tablet [id number] Yes TAKE ONE TABLET BY MOUTH TWICE DAILY WITH A MEAL Crecencio Mc, MD Taking Active   metoprolol succinate (TOPROL-XL) 25 MG 24 hr tablet [id number] Yes TAKE ONE TABLET (25 MG) BY MOUTH EVERY DAY Crecencio Mc, MD Taking Active   rosuvastatin (CRESTOR) 20 MG tablet [id number] Yes TAKE 1 TABLET BY MOUTH DAILY. Crecencio Mc, MD Taking Active   sertraline (ZOLOFT) 50 MG tablet [id number] Yes TAKE ONE AND A HALF TABLETS (75 MG) BY MOUTH EVERY DAY Crecencio Mc, MD Taking Active   tirzepatide [facility]) 2.5 MG/0.5ML Pen [id number] Yes Inject 2.5 mg into the skin once a week. Crecencio Mc, MD Taking Active             Pertinent Labs:   Lab Results  Component Value Date   HGBA1C 7.4 (H) 11/29/2020   Lab Results  Component Value Date   CHOL 200 11/29/2020   HDL 42.20 11/29/2020   LDLCALC 230 (H) 06/07/2015   LDLDIRECT 125.0 11/29/2020   TRIG 293.0 (H) 11/29/2020   CHOLHDL 5 11/29/2020   Lab Results  Component Value Date   CREATININE 0.66 11/29/2020   BUN 14 11/29/2020   NA 137 11/29/2020   K 3.6 11/29/2020   CL 96 11/29/2020   CO2 28 11/29/2020    SDOH:  (Social Determinants of Health) assessments and interventions performed:  SDOH Interventions  Flowsheet Row Most Recent Value  SDOH Interventions   Financial Strain Interventions Other (Comment)  [manufacturer assistance]       CCM Care Plan  Review of patient past medical history, allergies, medications, health status, including review of consultants reports, laboratory and other test data, was performed as part of comprehensive evaluation and provision of chronic care management services.   Care Plan : Medication Monitoring  Updates made by De Hollingshead, RPH-CPP since 01/25/2021  12:00 AM     Problem: DM      Long-Range Goal: Disease Progression Prevention   Start Date: 01/25/2021  This Visit's Progress: On track  Priority: High  Note:   Current Barriers:  Unable to achieve control of diabetes, hyperlipidemia    Pharmacist Clinical Goal(s):  Over the next 90 days, patient will achieve control of diabetes as evidenced by A1c  through collaboration with PharmD and provider.    Interventions: 1:1 collaboration with Crecencio Mc, MD regarding development and update of comprehensive plan of care as evidenced by provider attestation and co-signature Inter-disciplinary care team collaboration (see longitudinal plan of care) Comprehensive medication review performed; medication list updated in electronic medical record   Diabetes, Fatty Liver DIsease: Uncontrolled; current treatment: metformin 500 mg BID; Mounjaro 2.5 mg weekly x 1 week Insurance previously denied coverage of Ozempic due to not having tried and failed all generic alternatives  Current glucose readings: post prandials: 130s Current physical activity: joined a gym. Working out 3-4 days a week and walking daily with her sister.  Current meal patterns: has a meal planning guide from gym Reviewed goal A1c, goal fasting, goal 2 hour post prandial glucose readings.  Assisted patient in downloading savings card from manufacturer website. Discussed that her insurance is unlikely to cover [facility] even with PA or appeal, given prior lack of coverage of Ozempic. Recommend use of manufacturer savings card. Discussed need to go to preferred pharmacy. Sending to [facility] per patient request. Will follow up tomorrow.    Hypertension: Controlled per last office visit; current treatment: HCTZ 12.5 mg QAM, losartan/HCTZ 50/12.5, metoprolol succinate 25 mg QAM Previously recommended to continue current regimen at this time along with home BP readings   Hyperlipidemia: Uncontrolled; current treatment: rosuvastatin  20 mg Previously recommended to continue current regimen at this time. Consider dose increase of rosuvastatin moving forward. May require additional therapy w/ ezetimibe vs [id number] in the future.   Hypothyroidism: Controlled per last lab work; current regimen: levothyroxine 88 mcg daily Previously recommended to continue current regimen at this time   Depression Controlled per patient report;  current treatment: sertraline 75 mg daily;  Previously recommended to continue current regimen at this time   GERD: Controlled per patient report; current regimen: esomeprazole 20 mg daily Previously recommended to continue current regimen at this time   Patient Goals/Self-Care Activities Over the next 90 days, patient will:  - take medications as prescribed check glucose twice daily, document, and provide at future appointments target a minimum of 150 minutes of moderate intensity exercise weekly engage in dietary modifications by reducing carbohydrate serving sizes      Plan: Telephone follow up appointment with care management team member scheduled for:  tomorrow  [name] Darnelle Maffucci, PharmD, [place], CPP Clinical Pharmacist [place] at [facility] [phone]   I have reviewed the above information and agree with above.   Deborra Medina, MD

## 2021-01-26 ENCOUNTER — Ambulatory Visit: Payer: PRIVATE HEALTH INSURANCE | Admitting: Pharmacist

## 2021-01-26 DIAGNOSIS — I10 Essential (primary) hypertension: Secondary | ICD-10-CM

## 2021-01-26 DIAGNOSIS — E1151 Type 2 diabetes mellitus with diabetic peripheral angiopathy without gangrene: Secondary | ICD-10-CM

## 2021-01-26 NOTE — Patient Instructions (Addendum)
Visit Information  Patient Goals/Self-Care Activities Over the next 90 days, patient will:  - take medications as prescribed check glucose twice daily, document, and provide at future appointments target a minimum of 150 minutes of moderate intensity exercise weekly engage in dietary modifications by reducing carbohydrate serving sizes  Patient verbalizes understanding of instructions provided today and agrees to view in [place].    Plan: Telephone follow up appointment with care management team member scheduled for:  pending access plan  [name] Darnelle Maffucci, PharmD, [place], [place] Clinical Pharmacist [employer] at [facility] [phone]

## 2021-01-26 NOTE — Chronic Care Management (AMB) (Addendum)
Chronic Care Management CCM Pharmacy Note  01/26/2021 Name:  Christina Stafford MRN:  [id number] DOB:  May 19, 1970  Summary: - Continuing to determine if Mounjaro savings card is an option  Recommendations/Changes made from today's visit: - No changes at this time  Subjective: Christina Stafford is an 50 y.o. year old female who is a primary patient of Derrel Nip, Aris Everts, MD.  The CCM team was consulted for assistance with disease management and care coordination needs.    Engaged with patient by telephone for  medication access  for pharmacy case management and/or care coordination services.   Objective:  Medications Reviewed Today     Reviewed by Adair Laundry, [place] (Certified Medical Assistant) on 01/10/21 at [phone]  Med List Status: <None>   Medication Order Taking? Sig Documenting Provider Last Dose Status Informant  ALPRAZolam (XANAX) 0.5 MG tablet [id number] Yes Take 1 tablet (0.5 mg total) by mouth daily as needed for anxiety (or panic attacks). Crecencio Mc, MD Taking Active   blood glucose meter kit and supplies [id number] Yes Dispense based on patient and insurance preference. Use to check blood sugars twice daily. Crecencio Mc, MD Taking Active   cetirizine (ZYRTEC) 10 MG tablet [id number] Yes Take 10 mg by mouth daily. [provider] Taking Active   esomeprazole (NEXIUM) 20 MG capsule [id number] Yes Take 20 mg by mouth daily. [provider] Taking Active Self  glucose blood test strip [id number] Yes Use to check blood sugars twice daily. Crecencio Mc, MD Taking Active   hydrochlorothiazide (MICROZIDE) 12.5 MG capsule [id number] Yes TAKE 1 CAPSULE (12.5 MG) BY MOUTH EVERY DAY Crecencio Mc, MD Taking Active   levothyroxine (SYNTHROID) 88 MCG tablet [id number] Yes TAKE 1 TABLET EVERY DAY ON EMPTY STOMACHWITH A GLASS OF WATER AT LEAST 30-60 MINBEFORE BREAKFAST Crecencio Mc, MD Taking Active   losartan-hydrochlorothiazide [facility]) 50-12.5 MG  tablet [id number] Yes TAKE 1 TABLET BY MOUTH DAILY Crecencio Mc, MD Taking Active   metFORMIN (GLUCOPHAGE) 500 MG tablet [id number] Yes TAKE ONE TABLET BY MOUTH TWICE DAILY WITH A MEAL Crecencio Mc, MD Taking Active   metoprolol succinate (TOPROL-XL) 25 MG 24 hr tablet [id number] Yes TAKE ONE TABLET (25 MG) BY MOUTH EVERY DAY Crecencio Mc, MD Taking Active   rosuvastatin (CRESTOR) 20 MG tablet [id number] Yes TAKE 1 TABLET BY MOUTH DAILY. Crecencio Mc, MD Taking Active   sertraline (ZOLOFT) 50 MG tablet [id number] Yes TAKE ONE AND A HALF TABLETS (75 MG) BY MOUTH EVERY DAY Crecencio Mc, MD Taking Active   tirzepatide [facility]) 2.5 MG/0.5ML Pen [id number] Yes Inject 2.5 mg into the skin once a week. Crecencio Mc, MD Taking Active             Pertinent Labs:   Lab Results  Component Value Date   HGBA1C 7.4 (H) 11/29/2020   Lab Results  Component Value Date   CHOL 200 11/29/2020   HDL 42.20 11/29/2020   LDLCALC 230 (H) 06/07/2015   LDLDIRECT 125.0 11/29/2020   TRIG 293.0 (H) 11/29/2020   CHOLHDL 5 11/29/2020   Lab Results  Component Value Date   CREATININE 0.66 11/29/2020   BUN 14 11/29/2020   NA 137 11/29/2020   K 3.6 11/29/2020   CL 96 11/29/2020   CO2 28 11/29/2020    SDOH:  (Social Determinants of Health) assessments and interventions performed:  SDOH Interventions    Flowsheet Row Most Recent Value  SDOH Interventions  Financial Strain Interventions Other (Comment)  [manufacturer assistance evaluation]       CCM Care Plan  Review of patient past medical history, allergies, medications, health status, including review of consultants reports, laboratory and other test data, was performed as part of comprehensive evaluation and provision of chronic care management services.   Care Plan : Medication Monitoring  Updates made by De Hollingshead, RPH-CPP since 01/26/2021 12:00 AM     Problem: DM      Long-Range Goal: Disease Progression Prevention    Start Date: 01/25/2021  Recent Progress: On track  Priority: High  Note:   Current Barriers:  Unable to achieve control of diabetes, hyperlipidemia    Pharmacist Clinical Goal(s):  Over the next 90 days, patient will achieve control of diabetes as evidenced by A1c  through collaboration with PharmD and provider.    Interventions: 1:1 collaboration with Crecencio Mc, MD regarding development and update of comprehensive plan of care as evidenced by provider attestation and co-signature Inter-disciplinary care team collaboration (see longitudinal plan of care) Comprehensive medication review performed; medication list updated in electronic medical record   Diabetes, Fatty Liver DIsease: Uncontrolled; current treatment: metformin 500 mg BID; Mounjaro 2.5 mg weekly x 1 week Insurance previously denied coverage of Ozempic due to not having tried and failed all generic alternatives  Current glucose readings: post prandials: 130s Current physical activity: joined a gym. Working out 3-4 days a week and walking daily with her sister.  Current meal patterns: has a meal planning guide from gym Reviewed goal A1c, goal fasting, goal 2 hour post prandial glucose readings.  Contacted [facility], appears manufacturer savings card is rejecting as her insurance plan is more like a savings card itself. Advised that she call [facility] to ensure they have her most updated insurance information and if still rejecting, calling [place] at [phone].    Hypertension: Controlled per last office visit; current treatment: HCTZ 12.5 mg QAM, losartan/HCTZ 50/12.5, metoprolol succinate 25 mg QAM Previously recommended to continue current regimen at this time along with home BP readings   Hyperlipidemia: Uncontrolled; current treatment: rosuvastatin 20 mg Previously recommended to continue current regimen at this time. Consider dose increase of rosuvastatin moving forward. May require additional  therapy w/ ezetimibe vs PCSK9i in the future.   Hypothyroidism: Controlled per last lab work; current regimen: levothyroxine 88 mcg daily Previously recommended to continue current regimen at this time   Depression Controlled per patient report;  current treatment: sertraline 75 mg daily;  Previously recommended to continue current regimen at this time   GERD: Controlled per patient report; current regimen: esomeprazole 20 mg daily Previously recommended to continue current regimen at this time   Patient Goals/Self-Care Activities Over the next 90 days, patient will:  - take medications as prescribed check glucose twice daily, document, and provide at future appointments target a minimum of 150 minutes of moderate intensity exercise weekly engage in dietary modifications by reducing carbohydrate serving sizes      Plan: Telephone follow up appointment with care management team member scheduled for:  pending access plan  [name] Darnelle Maffucci, PharmD, [place], CPP Clinical Pharmacist [place] at [facility] [phone]    I have reviewed the above information and agree with above.   Deborra Medina, MD

## 2021-02-13 ENCOUNTER — Other Ambulatory Visit: Payer: Self-pay | Admitting: Internal Medicine

## 2021-03-06 ENCOUNTER — Other Ambulatory Visit: Payer: PRIVATE HEALTH INSURANCE

## 2021-03-14 ENCOUNTER — Other Ambulatory Visit: Payer: Self-pay | Admitting: Internal Medicine

## 2021-03-14 DIAGNOSIS — E119 Type 2 diabetes mellitus without complications: Secondary | ICD-10-CM

## 2021-03-16 ENCOUNTER — Other Ambulatory Visit: Payer: Self-pay

## 2021-03-16 ENCOUNTER — Other Ambulatory Visit (INDEPENDENT_AMBULATORY_CARE_PROVIDER_SITE_OTHER): Payer: PRIVATE HEALTH INSURANCE

## 2021-03-16 DIAGNOSIS — E1151 Type 2 diabetes mellitus with diabetic peripheral angiopathy without gangrene: Secondary | ICD-10-CM

## 2021-03-16 DIAGNOSIS — E876 Hypokalemia: Secondary | ICD-10-CM

## 2021-03-16 DIAGNOSIS — E039 Hypothyroidism, unspecified: Secondary | ICD-10-CM | POA: Diagnosis not present

## 2021-03-16 LAB — COMPREHENSIVE METABOLIC PANEL
ALT: 39 U/L — ABNORMAL HIGH (ref 0–35)
AST: 32 U/L (ref 0–37)
Albumin: 4.5 g/dL (ref 3.5–5.2)
Alkaline Phosphatase: 86 U/L (ref 39–117)
BUN: 10 mg/dL (ref 6–23)
CO2: 31 mEq/L (ref 19–32)
Calcium: 9.9 mg/dL (ref 8.4–10.5)
Chloride: 95 mEq/L — ABNORMAL LOW (ref 96–112)
Creatinine, Ser: 0.72 mg/dL (ref 0.40–1.20)
GFR: 96 mL/min (ref >60.00)
Glucose, Bld: 115 mg/dL — ABNORMAL HIGH (ref 70–99)
Potassium: 3.2 mEq/L — ABNORMAL LOW (ref 3.5–5.1)
Sodium: 138 mEq/L (ref 135–145)
Total Bilirubin: 0.5 mg/dL (ref 0.2–1.2)
Total Protein: 7 g/dL (ref 6.0–8.3)

## 2021-03-16 LAB — LIPID PANEL
Cholesterol: 179 mg/dL (ref 0–200)
HDL: 41.7 mg/dL (ref >39.00)
LDL Cholesterol: 103 mg/dL — ABNORMAL HIGH (ref 0–99)
NonHDL: 137.07
Total CHOL/HDL Ratio: 4
Triglycerides: 170 mg/dL — ABNORMAL HIGH (ref 0.0–149.0)
VLDL: 34 mg/dL (ref 0.0–40.0)

## 2021-03-16 LAB — TSH: TSH: 1.41 u[IU]/mL (ref 0.35–5.50)

## 2021-03-16 LAB — HEMOGLOBIN A1C: Hgb A1c MFr Bld: 7.1 % — ABNORMAL HIGH (ref 4.6–6.5)

## 2021-03-17 DIAGNOSIS — T502X5A Adverse effect of carbonic-anhydrase inhibitors, benzothiadiazides and other diuretics, initial encounter: Secondary | ICD-10-CM | POA: Insufficient documentation

## 2021-03-17 DIAGNOSIS — E876 Hypokalemia: Secondary | ICD-10-CM | POA: Insufficient documentation

## 2021-03-17 MED ORDER — POTASSIUM CHLORIDE CRYS ER 20 MEQ PO TBCR: 20.0000 meq | EXTENDED_RELEASE_TABLET | Freq: Every day | ORAL | 3 refills | Status: DC

## 2021-03-17 NOTE — Assessment & Plan Note (Signed)
Secondary to use of hctz  .  Will supplement until follow up can be made for medication change.

## 2021-03-17 NOTE — Addendum Note (Signed)
Addended by: Crecencio Mc on: 03/17/2021 04:52 PM   Modules accepted: Orders

## 2021-04-04 ENCOUNTER — Other Ambulatory Visit: Payer: Self-pay | Admitting: Internal Medicine

## 2021-05-03 ENCOUNTER — Encounter: Payer: Self-pay | Admitting: Internal Medicine

## 2021-05-03 ENCOUNTER — Telehealth: Payer: PRIVATE HEALTH INSURANCE | Admitting: Physician Assistant

## 2021-05-03 DIAGNOSIS — L239 Allergic contact dermatitis, unspecified cause: Secondary | ICD-10-CM

## 2021-05-03 MED ORDER — TRIAMCINOLONE ACETONIDE 0.1 % EX CREA: 1.0000 "application " | TOPICAL_CREAM | Freq: Two times a day (BID) | CUTANEOUS | 0 refills | Status: DC

## 2021-05-03 MED ORDER — HYDROXYZINE PAMOATE 25 MG PO CAPS: 25.0000 mg | ORAL_CAPSULE | Freq: Three times a day (TID) | ORAL | 0 refills | Status: DC | PRN

## 2021-05-03 NOTE — Progress Notes (Signed)
E Visit for Rash  We are sorry that you are not feeling well. Here is how we plan to help!  I am concerned for an allergic deaction. I would speak with your PCP before continuing the [facility], especially if there was a recent dose increase. Keep an eye for any new soaps, lotions, detergents, etc that could have also caused this issue. Giving your diabetes we want to limit use of oral steroid if possible. As such I am sending in a prescription antihistamine to calm this down further along with a topical steroid cream. If not resolving you need an in-person evaluation ASAP.   HOME CARE:  Take cool showers and avoid direct sunlight. Apply cool compress or wet dressings. Take a bath in an oatmeal bath.  Sprinkle content of one Aveeno packet under running faucet with comfortably warm water.  Bathe for 15-20 minutes, 1-2 times daily.  Pat dry with a towel. Do not rub the rash. Use hydrocortisone cream. Take an antihistamine like Benadryl for widespread rashes that itch.  The adult dose of Benadryl is 25-50 mg by mouth 4 times daily. Caution:  This type of medication may cause sleepiness.  Do not drink alcohol, drive, or operate dangerous machinery while taking antihistamines.  Do not take these medications if you have prostate enlargement.  Read package instructions thoroughly on all medications that you take.  GET HELP RIGHT AWAY IF:  Symptoms don't go away after treatment. Severe itching that persists. If you rash spreads or swells. If you rash begins to smell. If it blisters and opens or develops a yellow-brown crust. You develop a fever. You have a sore throat. You become short of breath.  MAKE SURE YOU:  Understand these instructions. Will watch your condition. Will get help right away if you are not doing well or get worse.  Thank you for choosing an e-visit.  Your e-visit answers were reviewed by a board certified advanced clinical practitioner to complete your personal care plan.  Depending upon the condition, your plan could have included both over the counter or prescription medications.  Please review your pharmacy choice. Make sure the pharmacy is open so you can pick up prescription now. If there is a problem, you may contact your provider through [employer] and have the prescription routed to another pharmacy.  Your safety is important to [place]. If you have drug allergies check your prescription carefully.   For the next 24 hours you can use MyChart to ask questions about today's visit, request a non-urgent call back, or ask for a work or school excuse. You will get an email in the next two days asking about your experience. I hope that your e-visit has been valuable and will speed your recovery.

## 2021-05-03 NOTE — Progress Notes (Signed)
I have spent 5 minutes in review of e-visit questionnaire, review and updating patient chart, medical decision making and response to patient.   Brason Berthelot Cody Taite Schoeppner, PA-C    

## 2021-05-16 ENCOUNTER — Other Ambulatory Visit: Payer: Self-pay | Admitting: Internal Medicine

## 2021-05-22 ENCOUNTER — Telehealth: Payer: PRIVATE HEALTH INSURANCE | Admitting: Physician Assistant

## 2021-05-22 ENCOUNTER — Ambulatory Visit: Payer: Self-pay | Admitting: Pharmacist

## 2021-05-22 DIAGNOSIS — R058 Other specified cough: Secondary | ICD-10-CM | POA: Diagnosis not present

## 2021-05-22 MED ORDER — PREDNISONE 20 MG PO TABS: 20.0000 mg | ORAL_TABLET | Freq: Every day | ORAL | 0 refills | Status: DC

## 2021-05-22 NOTE — Chronic Care Management (AMB) (Signed)
?  Chronic Care Management  ? ?Note ? ?05/22/2021 ?Name: Christina Stafford MRN: [id number] DOB: 07/11/1970 ? ? ? ?Closing pharmacy CCM case at this time. Patient has clinic contact information for future questions or concerns.  ? ?[name] Darnelle Maffucci, PharmD, [place], CPP ?Clinical Pharmacist ?Therapist, music at [employer] ?[phone] ? ?

## 2021-05-22 NOTE — Progress Notes (Signed)
We are sorry that you are not feeling well.  Here is how we plan to help! ? ?Based on your presentation I believe you most likely have A cough due to a virus.  This is called viral bronchitis and is best treated by rest, plenty of fluids and control of the cough.  You may use Ibuprofen or Tylenol as directed to help your symptoms.   ?  ?In addition you may continue to use the cough medications you have on hand. ? ?I do feel you would benefit from a short burst of a steroid. I will prescribe Prednisone 7m for you to take once daily for 5 days. I am aware that you are diabetic and your sugars have been decently controlled. I am choosing to use a low dose Prednisone to hopefully not affect your blood sugars, but treat the inflammation in the airway from the recent upper respiratory infection.  ? ?From your responses in the eVisit questionnaire you describe inflammation in the upper respiratory tract which is causing a significant cough.  This is commonly called Bronchitis and has four common causes:   ?Allergies ?Viral Infections ?Acid Reflux ?Bacterial Infection ?Allergies, viruses and acid reflux are treated by controlling symptoms or eliminating the cause. An example might be a cough caused by taking certain blood pressure medications. You stop the cough by changing the medication. Another example might be a cough caused by acid reflux. Controlling the reflux helps control the cough. ? ?USE OF BRONCHODILATOR ("RESCUE") INHALERS: ?There is a risk from using your bronchodilator too frequently.  The risk is that over-reliance on a medication which only relaxes the muscles surrounding the breathing tubes can reduce the effectiveness of medications prescribed to reduce swelling and congestion of the tubes themselves.  Although you feel brief relief from the bronchodilator inhaler, your asthma may actually be worsening with the tubes becoming more swollen and filled with mucus.  This can delay other crucial treatments,  such as oral steroid medications. If you need to use a bronchodilator inhaler daily, several times per day, you should discuss this with your provider.  There are probably better treatments that could be used to keep your asthma under control.  ?   ?HOME CARE ?Only take medications as instructed by your medical team. ?Complete the entire course of an antibiotic. ?Drink plenty of fluids and get plenty of rest. ?Avoid close contacts especially the very young and the elderly ?Cover your mouth if you cough or cough into your sleeve. ?Always remember to wash your hands ?A steam or ultrasonic humidifier can help congestion.  ? ?GET HELP RIGHT AWAY IF: ?You develop worsening fever. ?You become short of breath ?You cough up blood. ?Your symptoms persist after you have completed your treatment plan ?MAKE SURE YOU  ?Understand these instructions. ?Will watch your condition. ?Will get help right away if you are not doing well or get worse. ?  ? ?Thank you for choosing an e-visit. ? ?Your e-visit answers were reviewed by a board certified advanced clinical practitioner to complete your personal care plan. Depending upon the condition, your plan could have included both over the counter or prescription medications. ? ?Please review your pharmacy choice. Make sure the pharmacy is open so you can pick up prescription now. If there is a problem, you may contact your provider through [employer] have the prescription routed to another pharmacy.  Your safety is important to [place] If you have drug allergies check your prescription carefully.  ? ?For  the next 24 hours you can use MyChart to ask questions about today's visit, request a non-urgent call back, or ask for a work or school excuse. ?You will get an email in the next two days asking about your experience. I hope that your e-visit has been valuable and will speed your recovery. ? ?I provided 5 minutes of non face-to-face time during this encounter for chart review and  documentation.  ? ?

## 2021-05-31 ENCOUNTER — Other Ambulatory Visit: Payer: Self-pay

## 2021-05-31 MED ORDER — TIRZEPATIDE 5 MG/0.5ML ~~LOC~~ SOAJ: 5.0000 mg | SUBCUTANEOUS | 2 refills | Status: DC

## 2021-06-05 IMAGING — US US ABDOMEN LIMITED
1 series · 14 of 25 positions shown · non-contrast
Comparison: CT abdomen pelvis 08/27/2006

CLINICAL DATA: Abnormal liver function tests

EXAM:
ULTRASOUND ABDOMEN LIMITED RIGHT UPPER QUADRANT

[Series 1: us abdomen limited ruq (liver/gb) · 14 of 42 slices shown]
[im 1/42]
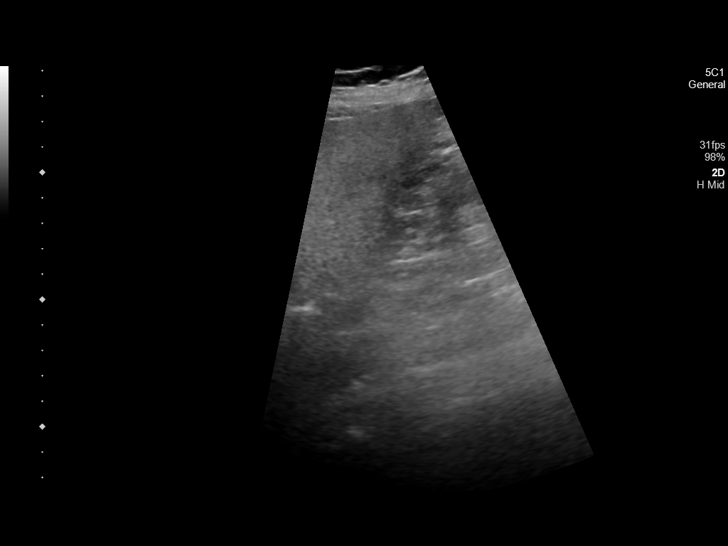
[im 4/42]
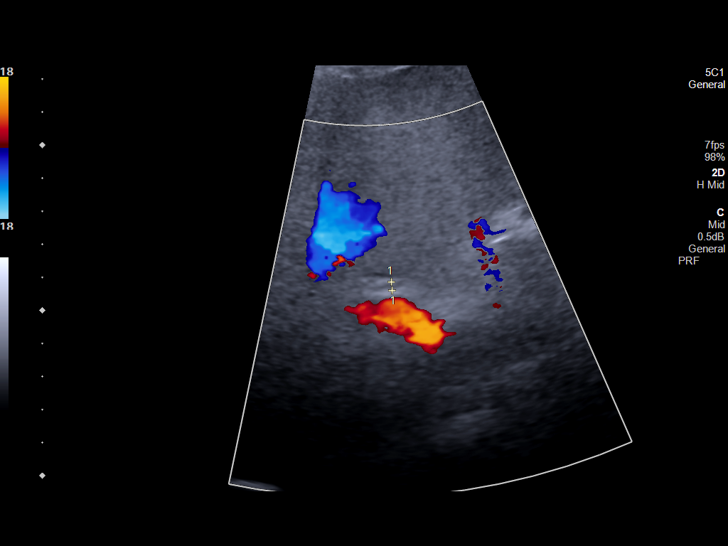
[im 7/42]
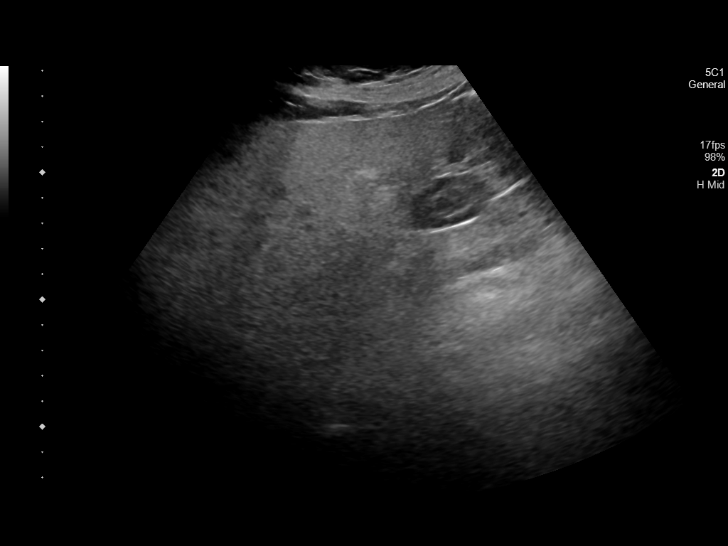
[im 11/42]
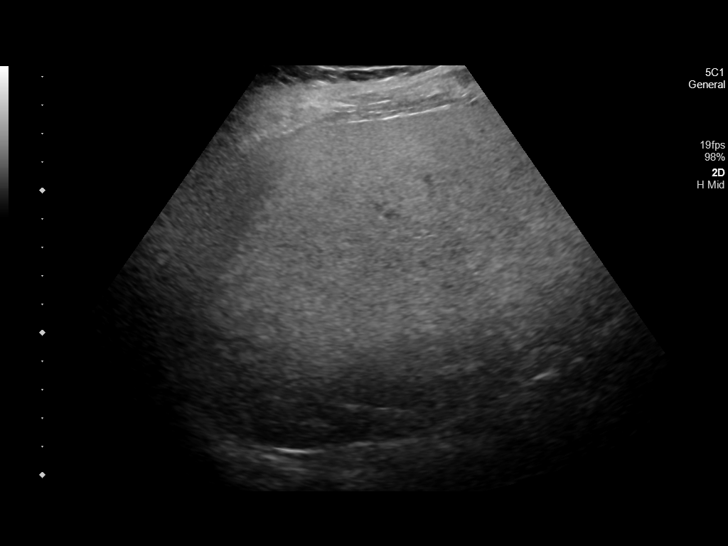
[im 14/42]
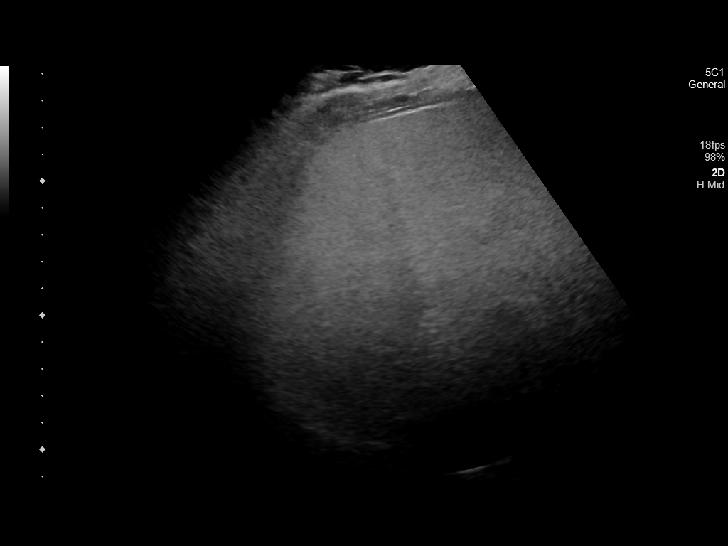
[im 16/42]
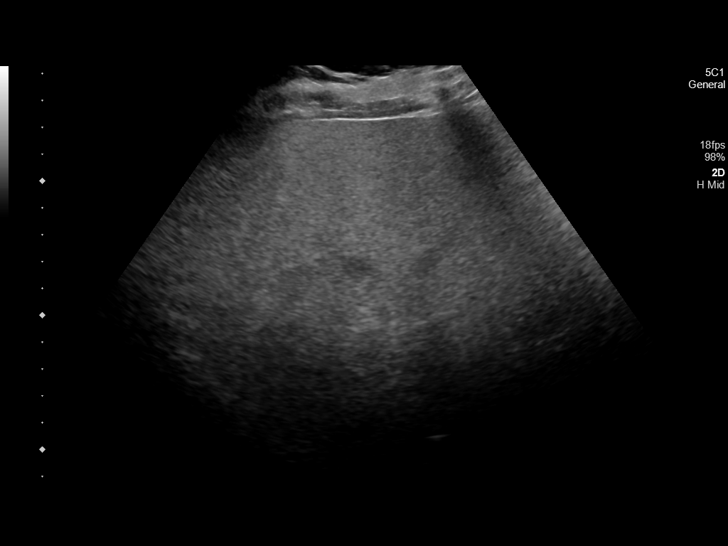
[im 19/42]
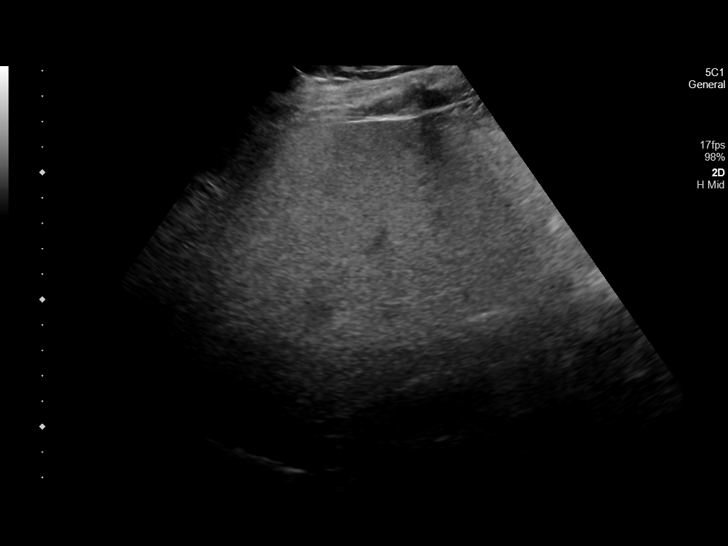
[im 23/42]
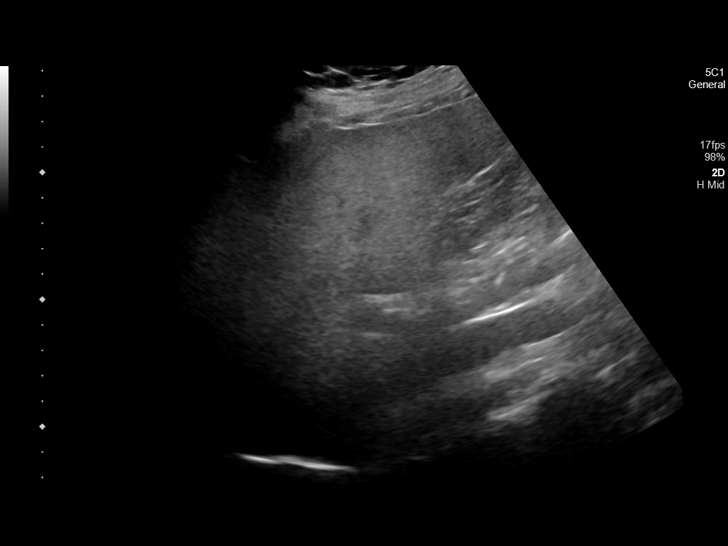
[im 26/42]
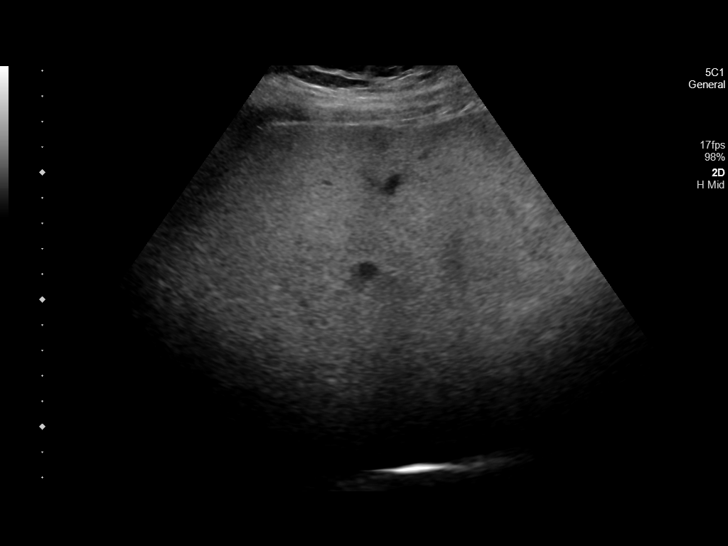
[im 28/42]
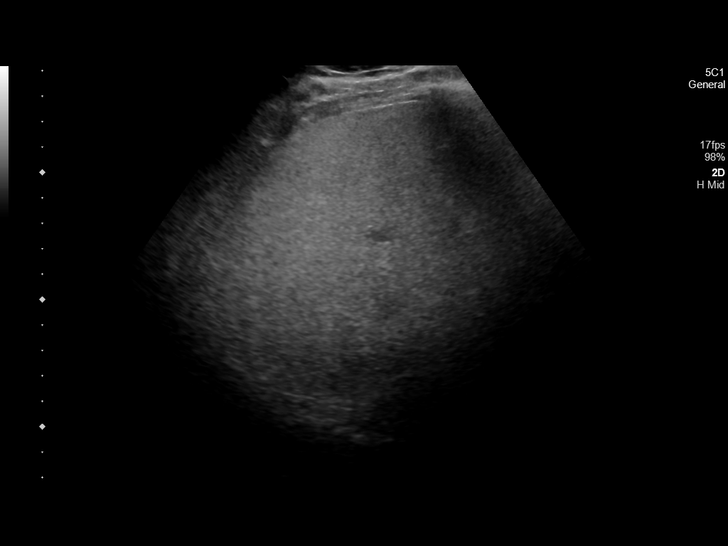
[im 31/42]
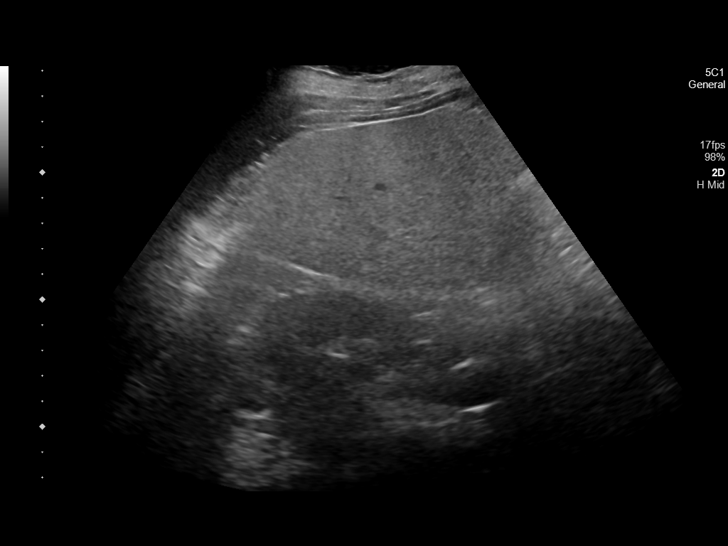
[im 35/42]
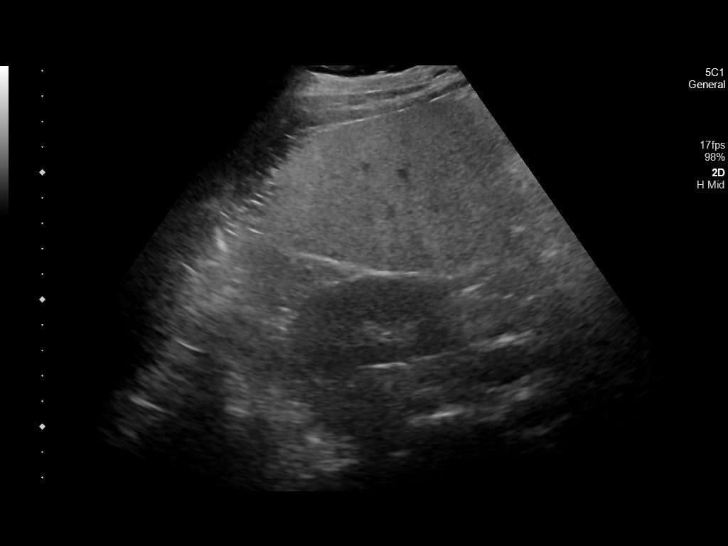
[im 38/42]
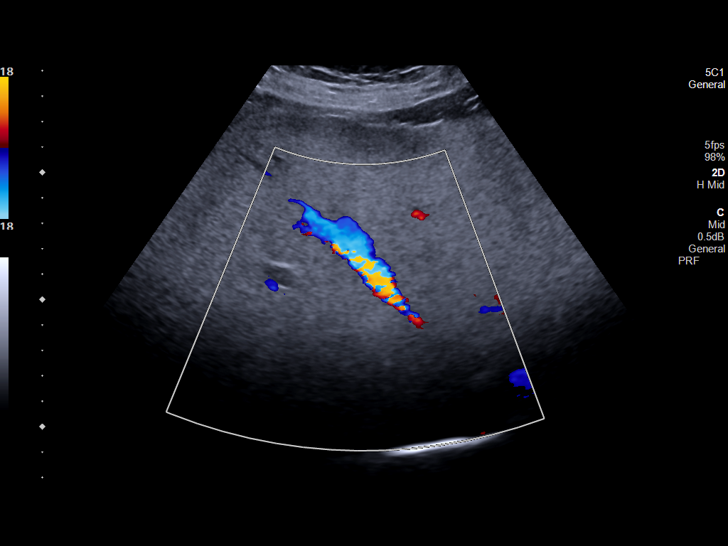
[im 42/42]
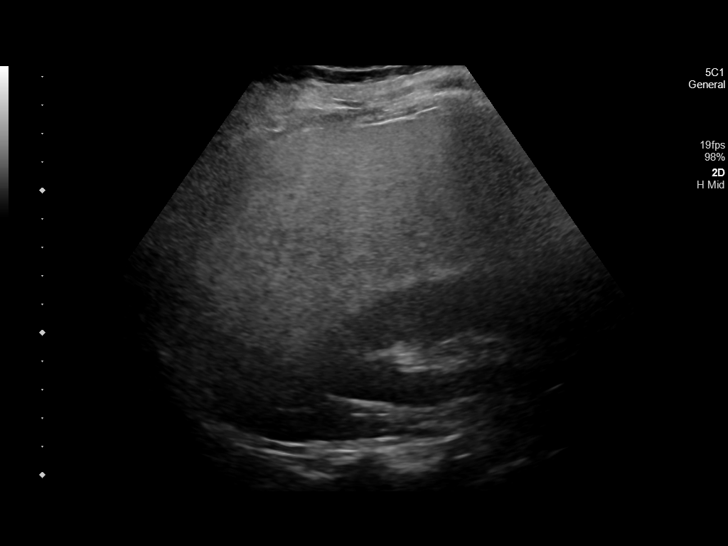

[14 of 25 positions shown; findings below may reference images not displayed]

FINDINGS: Gallbladder:

Surgically absent.

Common bile duct:

Diameter: 0.3 cm, within normal limits.

Liver:

No focal lesion identified. Diffusely increased liver parenchymal
echogenicity. Portal vein is patent on color Doppler imaging with
normal direction of blood flow towards the liver.

Other: None.
IMPRESSION: Diffusely increased liver parenchymal echogenicity which is a
nonspecific finding but most commonly seen with hepatic steatosis.

## 2021-06-15 ENCOUNTER — Other Ambulatory Visit: Payer: Self-pay | Admitting: Internal Medicine

## 2021-06-15 DIAGNOSIS — E119 Type 2 diabetes mellitus without complications: Secondary | ICD-10-CM

## 2021-07-03 ENCOUNTER — Other Ambulatory Visit: Payer: Self-pay | Admitting: Internal Medicine

## 2021-07-25 ENCOUNTER — Other Ambulatory Visit: Payer: Self-pay | Admitting: Internal Medicine

## 2021-08-16 ENCOUNTER — Other Ambulatory Visit: Payer: Self-pay | Admitting: Internal Medicine

## 2021-08-16 DIAGNOSIS — E782 Mixed hyperlipidemia: Secondary | ICD-10-CM

## 2021-09-06 ENCOUNTER — Encounter: Payer: Self-pay | Admitting: Internal Medicine

## 2021-09-06 ENCOUNTER — Telehealth: Payer: Self-pay

## 2021-09-06 DIAGNOSIS — E1151 Type 2 diabetes mellitus with diabetic peripheral angiopathy without gangrene: Secondary | ICD-10-CM

## 2021-09-06 MED ORDER — TIRZEPATIDE 5 MG/0.5ML ~~LOC~~ SOAJ: 5.0000 mg | SUBCUTANEOUS | 2 refills | Status: DC

## 2021-09-06 NOTE — Telephone Encounter (Signed)
Pt returning call

## 2021-09-06 NOTE — Telephone Encounter (Signed)
Patient states she heard from her pharmacy that her tirzepatide [facility]) 5 MG/0.5ML Pen refill is pending doctor's approval and she would like to know if we need anything from her to approve the refill.  Patient states she also would like to know if the dosage should remain the same.

## 2021-09-06 NOTE — Telephone Encounter (Signed)
See telephone encounter. Medication has been refilled and pt has been scheduled for a follow up.

## 2021-09-06 NOTE — Telephone Encounter (Signed)
Medication has been refilled and pt has been scheduled for a follow up on 10/04/2021. Pt is aware of appt date and time.

## 2021-09-07 MED ORDER — TIRZEPATIDE 5 MG/0.5ML ~~LOC~~ SOAJ: 5.0000 mg | SUBCUTANEOUS | 0 refills | Status: DC

## 2021-10-04 ENCOUNTER — Ambulatory Visit: Payer: PRIVATE HEALTH INSURANCE | Admitting: Internal Medicine

## 2021-10-04 ENCOUNTER — Encounter: Payer: Self-pay | Admitting: Internal Medicine

## 2021-10-04 VITALS — BP 120/82 | HR 68 | Temp 98.2°F | Ht 63.0 in | Wt 201.6 lb

## 2021-10-04 DIAGNOSIS — I10 Essential (primary) hypertension: Secondary | ICD-10-CM | POA: Diagnosis not present

## 2021-10-04 DIAGNOSIS — E782 Mixed hyperlipidemia: Secondary | ICD-10-CM | POA: Diagnosis not present

## 2021-10-04 DIAGNOSIS — E1151 Type 2 diabetes mellitus with diabetic peripheral angiopathy without gangrene: Secondary | ICD-10-CM | POA: Diagnosis not present

## 2021-10-04 DIAGNOSIS — E039 Hypothyroidism, unspecified: Secondary | ICD-10-CM

## 2021-10-04 DIAGNOSIS — Z1231 Encounter for screening mammogram for malignant neoplasm of breast: Secondary | ICD-10-CM

## 2021-10-04 LAB — COMPREHENSIVE METABOLIC PANEL
ALT: 39 U/L — ABNORMAL HIGH (ref 0–35)
AST: 33 U/L (ref 0–37)
Albumin: 4.7 g/dL (ref 3.5–5.2)
Alkaline Phosphatase: 81 U/L (ref 39–117)
BUN: 15 mg/dL (ref 6–23)
CO2: 32 mEq/L (ref 19–32)
Calcium: 10.1 mg/dL (ref 8.4–10.5)
Chloride: 96 mEq/L (ref 96–112)
Creatinine, Ser: 0.73 mg/dL (ref 0.40–1.20)
GFR: 94.06 mL/min (ref >60.00)
Glucose, Bld: 104 mg/dL — ABNORMAL HIGH (ref 70–99)
Potassium: 3.6 mEq/L (ref 3.5–5.1)
Sodium: 139 mEq/L (ref 135–145)
Total Bilirubin: 0.4 mg/dL (ref 0.2–1.2)
Total Protein: 7.3 g/dL (ref 6.0–8.3)

## 2021-10-04 LAB — MICROALBUMIN / CREATININE URINE RATIO
Creatinine,U: 117.3 mg/dL
Microalb Creat Ratio: 0.6 mg/g (ref 0.0–30.0)
Microalb, Ur: <0.7 mg/dL (ref 0.0–1.9)

## 2021-10-04 LAB — HEMOGLOBIN A1C: Hgb A1c MFr Bld: 6.5 % (ref 4.6–6.5)

## 2021-10-04 LAB — TSH: TSH: 2.97 u[IU]/mL (ref 0.35–5.50)

## 2021-10-04 MED ORDER — TIRZEPATIDE 7.5 MG/0.5ML ~~LOC~~ SOAJ: 7.5000 mg | SUBCUTANEOUS | 2 refills | Status: DC

## 2021-10-04 MED ORDER — ZOSTER VAC RECOMB ADJUVANTED 50 MCG/0.5ML IM SUSR: 0.5000 mL | Freq: Once | INTRAMUSCULAR | 1 refills | Status: AC

## 2021-10-04 NOTE — Patient Instructions (Addendum)
We increased the mounjaro dose today to 7.5 mg weekly.  Let me know when you want to increase the dose again    See you in 3 months

## 2021-10-05 LAB — LIPID PANEL W/REFLEX DIRECT LDL
Cholesterol: 183 mg/dL (ref <200)
HDL: 55 mg/dL (ref >=50)
LDL Cholesterol (Calc): 100 mg/dL (calc) — ABNORMAL HIGH
Non-HDL Cholesterol (Calc): 128 mg/dL (calc) (ref <130)
Total CHOL/HDL Ratio: 3.3 (calc) (ref <5.0)
Triglycerides: 190 mg/dL — ABNORMAL HIGH (ref <150)

## 2021-10-06 NOTE — Progress Notes (Signed)
Subjective:  Patient ID: Christina Stafford, female    DOB: [birth year]  Age: 51 y.o. MRN: [id number]  CC: The primary encounter diagnosis was Primary hypertension. Diagnoses of DM (diabetes mellitus), type 2 with peripheral vascular complications ([place]), Acquired hypothyroidism, Mixed hyperlipidemia, and Encounter for screening mammogram for malignant neoplasm of breast were also pertinent to this visit.   HPI Christina Stafford presents follow  up on type 2 DM with obesity  Chief Complaint  Patient presents with   Follow-up    Follow up on diabetes   1)  T2DM:  She  feels generally well,  is walking daly for exercise , and trying to lose weight. Checking  blood sugars less than once daily at variable times, usually only if she feels she may be having a hypoglycemic event. .  BS have been under 130 fasting and < 150 post prandially.  Denies any recent hypoglyemic events.  Taking   medications as directed, inclufing Mounjatro . Following a carbohydrate modified diet 6 days per week. Denies numbness, burning and tingling of extremities. Appetite is good.     Outpatient Medications Prior to Visit  Medication Sig Dispense Refill   ALPRAZolam (XANAX) 0.5 MG tablet Take 1 tablet (0.5 mg total) by mouth daily as needed for anxiety (or panic attacks). 30 tablet 0   blood glucose meter kit and supplies Dispense based on patient and insurance preference. Use to check blood sugars twice daily. 1 each 0   cetirizine (ZYRTEC) 10 MG tablet Take 10 mg by mouth daily.     esomeprazole (NEXIUM) 20 MG capsule Take 20 mg by mouth daily.     glucose blood test strip Use to check blood sugars twice daily. 100 each 5   hydrochlorothiazide (MICROZIDE) 12.5 MG capsule TAKE 1 CAPSULE (12.5 MG) BY MOUTH EVERY DAY 90 capsule 1   levothyroxine (SYNTHROID) 88 MCG tablet TAKE 1 TABLET EVERY DAY ON EMPTY STOMACHWITH A GLASS OF WATER AT LEAST 30-60 MINBEFORE BREAKFAST 90 tablet 1   losartan-hydrochlorothiazide  (HYZAAR) 50-12.5 MG tablet TAKE 1 TABLET BY MOUTH DAILY 90 tablet 0   metFORMIN (GLUCOPHAGE) 500 MG tablet TAKE ONE TABLET TWICE A DAY WITH MEALS 180 tablet 1   metoprolol succinate (TOPROL-XL) 25 MG 24 hr tablet TAKE ONE TABLET (25 MG) BY MOUTH EVERY DAY 90 tablet 1   rosuvastatin (CRESTOR) 20 MG tablet TAKE 1 TABLET BY MOUTH DAILY. 90 tablet 1   sertraline (ZOLOFT) 50 MG tablet TAKE ONE AND A HALF TABLETS (75 MG) BY MOUTH EVERY DAY 45 tablet 1   tirzepatide (MOUNJARO) 5 MG/0.5ML Pen Inject 5 mg into the skin once a week. 2 mL 0   triamcinolone cream (KENALOG) 0.1 % Apply 1 application topically 2 (two) times daily. (Patient not taking: Reported on 10/04/2021) 30 g 0   hydrOXYzine (VISTARIL) 25 MG capsule Take 1 capsule (25 mg total) by mouth every 8 (eight) hours as needed. 30 capsule 0   potassium chloride SA (KLOR-CON M) 20 MEQ tablet Take 1 tablet (20 mEq total) by mouth daily. 30 tablet 3   predniSONE (DELTASONE) 20 MG tablet Take 1 tablet (20 mg total) by mouth daily with breakfast. 5 tablet 0   No facility-administered medications prior to visit.    Review of Systems;  Patient denies headache, fevers, malaise, unintentional weight loss, skin rash, eye pain, sinus congestion and sinus pain, sore throat, dysphagia,  hemoptysis , cough, dyspnea, wheezing, chest pain, palpitations, orthopnea, edema, abdominal pain, nausea, melena,  diarrhea, constipation, flank pain, dysuria, hematuria, urinary  Frequency, nocturia, numbness, tingling, seizures,  Focal weakness, Loss of consciousness,  Tremor, insomnia, depression, anxiety, and suicidal ideation.      Objective:  BP 120/82 (BP Location: Left Arm, Patient Position: Sitting, Cuff Size: Normal)   Pulse 68   Temp 98.2 F (36.8 C) (Oral)   Ht 5' 3"  (1.6 m)   Wt 201 lb 9.6 oz (91.4 kg)   LMP 01/20/2017 (Approximate)   SpO2 97%   BMI 35.71 kg/m   BP Readings from Last 3 Encounters:  10/04/21 120/82  01/10/21 108/78  11/29/20 (!) 144/92     Wt Readings from Last 3 Encounters:  10/04/21 201 lb 9.6 oz (91.4 kg)  01/10/21 209 lb 12.8 oz (95.2 kg)  11/29/20 211 lb 12.8 oz (96.1 kg)    General appearance: alert, cooperative and appears stated age Ears: normal TM's and external ear canals both ears Throat: lips, mucosa, and tongue normal; teeth and gums normal Neck: no adenopathy, no carotid bruit, supple, symmetrical, trachea midline and thyroid not enlarged, symmetric, no tenderness/mass/nodules Back: symmetric, no curvature. ROM normal. No CVA tenderness. Lungs: clear to auscultation bilaterally Heart: regular rate and rhythm, S1, S2 normal, no murmur, click, rub or gallop Abdomen: soft, non-tender; bowel sounds normal; no masses,  no organomegaly Pulses: 2+ and symmetric Skin: Skin color, texture, turgor normal. No rashes or lesions Lymph nodes: Cervical, supraclavicular, and axillary nodes normal.  Lab Results  Component Value Date   HGBA1C 6.5 10/04/2021   HGBA1C 7.1 (H) 03/16/2021   HGBA1C 7.4 (H) 11/29/2020    Lab Results  Component Value Date   CREATININE 0.73 10/04/2021   CREATININE 0.72 03/16/2021   CREATININE 0.66 11/29/2020    Lab Results  Component Value Date   WBC 5.9 02/07/2020   HGB 13.2 02/07/2020   HCT 39.0 02/07/2020   PLT 289 02/07/2020   GLUCOSE 104 (H) 10/04/2021   CHOL 183 10/04/2021   TRIG 190 (H) 10/04/2021   HDL 55 10/04/2021   LDLDIRECT 125.0 11/29/2020   LDLCALC 100 (H) 10/04/2021   ALT 39 (H) 10/04/2021   AST 33 10/04/2021   NA 139 10/04/2021   K 3.6 10/04/2021   CL 96 10/04/2021   CREATININE 0.73 10/04/2021   BUN 15 10/04/2021   CO2 32 10/04/2021   TSH 2.97 10/04/2021   INR 0.9 02/07/2020   HGBA1C 6.5 10/04/2021   MICROALBUR <0.7 10/04/2021    No results found.  Assessment & Plan:   Problem List Items Addressed This Visit     Hypothyroidism   Relevant Orders   TSH (Completed)   DM (diabetes mellitus), type 2 with peripheral vascular complications ([place])     Complicated by hyperlipidemia, hypertension, fatty liver and obesity with OSA .  She is tolerating Mounjaro .  Dose incresed to 7.5 mg weekly   Continue ARB and rosuvastatin.  Low glycemic index diet and regular exercise encouraged .  BP is at goal . LDL is 125 on current statin therapy  Lab Results  Component Value Date   HGBA1C 6.5 10/04/2021   Lab Results  Component Value Date   CHOL 183 10/04/2021   HDL 55 10/04/2021   LDLCALC 100 (H) 10/04/2021   LDLDIRECT 125.0 11/29/2020   TRIG 190 (H) 10/04/2021   CHOLHDL 3.3 10/04/2021   Lab Results  Component Value Date   MICROALBUR <0.7 10/04/2021   MICROALBUR <0.7 07/10/2020     Lab Results  Component Value Date  ALT 39 (H) 10/04/2021   AST 33 10/04/2021   ALKPHOS 81 10/04/2021   BILITOT 0.4 10/04/2021          Relevant Medications   tirzepatide (MOUNJARO) 7.5 MG/0.5ML Pen   Other Relevant Orders   HgB A1c (Completed)   Comp Met (CMET) (Completed)   Urine Microalbumin w/creat. ratio (Completed)   Hypertension - Primary   Relevant Orders   Comp Met (CMET) (Completed)   Urine Microalbumin w/creat. ratio (Completed)   Other Visit Diagnoses     Mixed hyperlipidemia       Relevant Orders   Lipid Panel w/reflex Direct LDL (Completed)   Encounter for screening mammogram for malignant neoplasm of breast       Relevant Orders   MM 3D SCREEN BREAST BILATERAL      \Follow-up: Return in about 3 months (around 01/04/2022) for follow up diabetes.   Crecencio Mc, MD

## 2021-10-06 NOTE — Assessment & Plan Note (Signed)
Complicated by hyperlipidemia, hypertension, fatty liver and obesity with OSA .  She is tolerating Mounjaro .  Dose incresed to 7.5 mg weekly   Continue ARB and rosuvastatin.  Low glycemic index diet and regular exercise encouraged .  BP is at goal . LDL is 125 on current statin therapy  Lab Results  Component Value Date   HGBA1C 6.5 10/04/2021   Lab Results  Component Value Date   CHOL 183 10/04/2021   HDL 55 10/04/2021   LDLCALC 100 (H) 10/04/2021   LDLDIRECT 125.0 11/29/2020   TRIG 190 (H) 10/04/2021   CHOLHDL 3.3 10/04/2021   Lab Results  Component Value Date   MICROALBUR <0.7 10/04/2021   MICROALBUR <0.7 07/10/2020     Lab Results  Component Value Date   ALT 39 (H) 10/04/2021   AST 33 10/04/2021   ALKPHOS 81 10/04/2021   BILITOT 0.4 10/04/2021

## 2021-10-30 ENCOUNTER — Encounter: Payer: Self-pay | Admitting: Internal Medicine

## 2021-10-31 ENCOUNTER — Other Ambulatory Visit: Payer: Self-pay | Admitting: Internal Medicine

## 2021-10-31 ENCOUNTER — Telehealth: Payer: Self-pay | Admitting: Internal Medicine

## 2021-10-31 MED ORDER — MOLNUPIRAVIR EUA 200MG CAPSULE: 4.0000 | ORAL_CAPSULE | Freq: Two times a day (BID) | ORAL | 0 refills | Status: AC

## 2021-10-31 NOTE — Telephone Encounter (Signed)
Pt called stating she tested positive for covid on yesterday and want medication called in. I let her know that she has to make an appointment to get new medication called in but she still insisted on me sending a message

## 2021-11-01 NOTE — Telephone Encounter (Signed)
See my chart message

## 2021-12-04 ENCOUNTER — Other Ambulatory Visit: Payer: Self-pay | Admitting: Internal Medicine

## 2021-12-05 ENCOUNTER — Encounter: Payer: Self-pay | Admitting: Internal Medicine

## 2021-12-07 ENCOUNTER — Ambulatory Visit
Admission: RE | Admit: 2021-12-07 | Discharge: 2021-12-07 | Disposition: A | Discharge status: Home / Self Care | Payer: PRIVATE HEALTH INSURANCE | Source: Ambulatory Visit | Attending: Internal Medicine | Admitting: Internal Medicine

## 2021-12-07 ENCOUNTER — Other Ambulatory Visit: Payer: Self-pay | Admitting: Internal Medicine

## 2021-12-07 DIAGNOSIS — Z1231 Encounter for screening mammogram for malignant neoplasm of breast: Secondary | ICD-10-CM | POA: Diagnosis present

## 2021-12-09 MED ORDER — TIRZEPATIDE 2.5 MG/0.5ML ~~LOC~~ SOAJ: 2.5000 mg | SUBCUTANEOUS | 1 refills | Status: DC

## 2021-12-10 ENCOUNTER — Other Ambulatory Visit: Payer: Self-pay | Admitting: Internal Medicine

## 2021-12-10 DIAGNOSIS — R928 Other abnormal and inconclusive findings on diagnostic imaging of breast: Secondary | ICD-10-CM

## 2021-12-10 MED ORDER — TIRZEPATIDE 2.5 MG/0.5ML ~~LOC~~ SOAJ: 2.5000 mg | SUBCUTANEOUS | 1 refills | Status: DC

## 2021-12-10 NOTE — Addendum Note (Signed)
Addended by: Adair Laundry on: 12/10/2021 10:45 AM   Modules accepted: Orders

## 2021-12-22 ENCOUNTER — Other Ambulatory Visit: Payer: Self-pay | Admitting: Internal Medicine

## 2021-12-22 DIAGNOSIS — E119 Type 2 diabetes mellitus without complications: Secondary | ICD-10-CM

## 2021-12-25 ENCOUNTER — Ambulatory Visit
Admission: RE | Admit: 2021-12-25 | Discharge: 2021-12-25 | Disposition: A | Discharge status: Home / Self Care | Payer: PRIVATE HEALTH INSURANCE | Source: Ambulatory Visit | Attending: Internal Medicine | Admitting: Internal Medicine

## 2021-12-25 ENCOUNTER — Other Ambulatory Visit: Payer: Self-pay | Admitting: Internal Medicine

## 2021-12-25 DIAGNOSIS — R928 Other abnormal and inconclusive findings on diagnostic imaging of breast: Secondary | ICD-10-CM

## 2021-12-26 ENCOUNTER — Telehealth: Payer: Self-pay | Admitting: Internal Medicine

## 2021-12-26 NOTE — Telephone Encounter (Signed)
Pt called stating the provider need to get in touch with [facility] to schedule the biopsy

## 2021-12-26 NOTE — Telephone Encounter (Signed)
The orders needs to be cosigned

## 2021-12-27 NOTE — Telephone Encounter (Signed)
Pt is aware that orders have been signed and she can call [facility] to have the appt scheduled.

## 2022-01-04 ENCOUNTER — Encounter: Payer: Self-pay | Admitting: Internal Medicine

## 2022-01-04 ENCOUNTER — Ambulatory Visit: Payer: PRIVATE HEALTH INSURANCE | Admitting: Internal Medicine

## 2022-01-04 VITALS — BP 140/90 | HR 71 | Temp 97.7°F | Resp 14 | Ht 63.0 in | Wt 212.0 lb

## 2022-01-04 DIAGNOSIS — I10 Essential (primary) hypertension: Secondary | ICD-10-CM | POA: Diagnosis not present

## 2022-01-04 DIAGNOSIS — K7581 Nonalcoholic steatohepatitis (NASH): Secondary | ICD-10-CM

## 2022-01-04 DIAGNOSIS — E1151 Type 2 diabetes mellitus with diabetic peripheral angiopathy without gangrene: Secondary | ICD-10-CM

## 2022-01-04 DIAGNOSIS — J0100 Acute maxillary sinusitis, unspecified: Secondary | ICD-10-CM

## 2022-01-04 DIAGNOSIS — E782 Mixed hyperlipidemia: Secondary | ICD-10-CM

## 2022-01-04 DIAGNOSIS — R058 Other specified cough: Secondary | ICD-10-CM | POA: Diagnosis not present

## 2022-01-04 DIAGNOSIS — E039 Hypothyroidism, unspecified: Secondary | ICD-10-CM

## 2022-01-04 DIAGNOSIS — J069 Acute upper respiratory infection, unspecified: Secondary | ICD-10-CM | POA: Insufficient documentation

## 2022-01-04 DIAGNOSIS — R928 Other abnormal and inconclusive findings on diagnostic imaging of breast: Secondary | ICD-10-CM

## 2022-01-04 DIAGNOSIS — G4733 Obstructive sleep apnea (adult) (pediatric): Secondary | ICD-10-CM

## 2022-01-04 LAB — POC COVID19 BINAXNOW: SARS Coronavirus 2 Ag: NEGATIVE

## 2022-01-04 LAB — POC INFLUENZA A&B (BINAX/QUICKVUE)
Influenza A, POC: NEGATIVE
Influenza B, POC: NEGATIVE

## 2022-01-04 MED ORDER — PREDNISONE 10 MG PO TABS: ORAL_TABLET | ORAL | 0 refills | Status: DC

## 2022-01-04 MED ORDER — AMOXICILLIN-POT CLAVULANATE 875-125 MG PO TABS: 1.0000 | ORAL_TABLET | Freq: Two times a day (BID) | ORAL | 0 refills | Status: DC

## 2022-01-04 NOTE — Assessment & Plan Note (Signed)
Thyroid function is WNL on 88 mcg  Daily  dose.  No current changes needed.   Lab Results  Component Value Date   TSH 2.97 10/04/2021

## 2022-01-04 NOTE — Assessment & Plan Note (Signed)
By biopsy .  No fibrosis. Taking statin, mounjaro  and metformin.   Lab Results  Component Value Date   ALT 39 (H) 10/04/2021   AST 33 10/04/2021   ALKPHOS 81 10/04/2021   BILITOT 0.4 10/04/2021

## 2022-01-04 NOTE — Assessment & Plan Note (Addendum)
Breast biopsy has been scheduled of left breast at [facility]

## 2022-01-04 NOTE — Assessment & Plan Note (Signed)
Diagnosed remotely with  sleep study. She is wearing her CPAP every night a minimum of 6 hours per night and notes improved daytime wakefulness and decreased fatigue

## 2022-01-04 NOTE — Patient Instructions (Signed)
I am treating you for bacterial sinusitis and otitis    I am prescribing an antibiotic (augmentin ) and a prednisone taper    To manage the infection and the inflammation in your ear/sinuses.   I also advise use of the following OTC meds to help with your other symptoms.   Take generic OTC benadryl 25 mg at bedtime  for the drainage,  Afrin every 12 hours if needed for congestion,    Igf the flu or COVID test is positive,,  I'll add an antifiral     Please take a probiotic ( Align, Flora que or Culturelle) OR A GENERIC EQUIVALENT for three weeks since you are taking an  antibiotic to prevent a very serious antibiotic associated infection  Called clostridium dificile colitis that can cause diarrhea, multi organ failure, sepsis and death if not managed.    Return in 3 months for diabetes follow up . Labs can be done prior to visit

## 2022-01-04 NOTE — Progress Notes (Signed)
Subjective:  Patient ID: Christina Stafford, female    DOB: [birth year]  Age: 51 y.o. MRN: [id number]  CC: The primary encounter diagnosis was Cough productive of purulent sputum. Diagnoses of Acquired hypothyroidism, Mixed hyperlipidemia, Primary hypertension, DM (diabetes mellitus), type 2 with peripheral vascular complications (HCC), Acute non-recurrent maxillary sinusitis, NASH (nonalcoholic steatohepatitis), OSA on CPAP, and Abnormal mammogram of left breast were also pertinent to this visit.   HPI Christina Stafford presents for  Chief Complaint  Patient presents with   Follow-up    Diabetes, pt c/o cough w/mucus, nasal congestions & pain in L ear ongoing since mon. Hx of covid back in 10/2021 seen at [facility].   3 day history of productive cough ,  left ear pain,  sore throat. Returned from [place] yesterday and [place] trip  [place] .  Ha.s not COVID tested,  because she had COVID in August .  All symptoms resolved except fatigue   Diabetes with obesity  :  she has been controlling appetite and BS with starting dose of Mounjaro,  and has lost 8  lbs on lowest dose, but has gained a few lbs after her COVID infection due to fatigue limiting her ability to exercise.  She is  Checking  blood sugars less than once daily at variable times, . .  BS have been under 130 fasting and < 150 post prandially.  Denies any recent hypoglyemic events.  Taking   medications as directed. Following a carbohydrate modified diet 6 days per week. Denies numbness, burning and tingling of extremities. Appetite is good.      Outpatient Medications Prior to Visit  Medication Sig Dispense Refill   ALPRAZolam (XANAX) 0.5 MG tablet Take 1 tablet (0.5 mg total) by mouth daily as needed for anxiety (or panic attacks). 30 tablet 0   blood glucose meter kit and supplies Dispense based on patient and insurance preference. Use to check blood sugars twice daily. 1 each 0   cetirizine (ZYRTEC) 10 MG  tablet Take 10 mg by mouth daily.     esomeprazole (NEXIUM) 20 MG capsule Take 20 mg by mouth daily.     glucose blood test strip Use to check blood sugars twice daily. 100 each 5   hydrochlorothiazide (MICROZIDE) 12.5 MG capsule TAKE 1 CAPSULE (12.5 MG) BY MOUTH EVERY DAY 90 capsule 1   levothyroxine (SYNTHROID) 88 MCG tablet TAKE 1 TABLET EVERY DAY ON EMPTY STOMACHWITH A GLASS OF WATER AT LEAST 30-60 MINBEFORE BREAKFAST 90 tablet 1   losartan-hydrochlorothiazide (HYZAAR) 50-12.5 MG tablet TAKE 1 TABLET BY MOUTH DAILY 90 tablet 0   metFORMIN (GLUCOPHAGE) 500 MG tablet TAKE ONE TABLET TWICE A DAY WITH MEALS 180 tablet 1   metoprolol succinate (TOPROL-XL) 25 MG 24 hr tablet TAKE ONE TABLET (25 MG) BY MOUTH EVERY DAY 90 tablet 1   rosuvastatin (CRESTOR) 20 MG tablet TAKE 1 TABLET BY MOUTH DAILY. 90 tablet 1   sertraline (ZOLOFT) 50 MG tablet TAKE ONE AND A HALF TABLETS (75 MG) BY MOUTH EVERY DAY 45 tablet 1   tirzepatide (MOUNJARO) 2.5 MG/0.5ML Pen Inject 2.5 mg into the skin once a week. 2 mL 1   triamcinolone cream (KENALOG) 0.1 % Apply 1 application topically 2 (two) times daily. (Patient not taking: Reported on 10/04/2021) 30 g 0   No facility-administered medications prior to visit.    Review of Systems;  Patient denies headache, fevers, malaise, unintentional weight loss, skin rash, eye pain, sinus congestion  and sinus pain, sore throat, dysphagia,  hemoptysis , cough, dyspnea, wheezing, chest pain, palpitations, orthopnea, edema, abdominal pain, nausea, melena, diarrhea, constipation, flank pain, dysuria, hematuria, urinary  Frequency, nocturia, numbness, tingling, seizures,  Focal weakness, Loss of consciousness,  Tremor, insomnia, depression, anxiety, and suicidal ideation.      Objective:  BP (!) 140/90 (BP Location: Left Arm, Patient Position: Sitting, Cuff Size: Normal)   Pulse 71   Temp 97.7 F (36.5 C) (Oral)   Resp 14   Ht 5' 3"  (1.6 m)   Wt 212 lb (96.2 kg)   LMP 01/20/2017  (Approximate)   SpO2 98%   BMI 37.55 kg/m   BP Readings from Last 3 Encounters:  01/04/22 (!) 140/90  10/04/21 120/82  01/10/21 108/78    Wt Readings from Last 3 Encounters:  01/04/22 212 lb (96.2 kg)  10/04/21 201 lb 9.6 oz (91.4 kg)  01/10/21 209 lb 12.8 oz (95.2 kg)    General appearance: alert, cooperative and appears stated age Ears: normal TM's and external ear canals both ears Throat: lips, mucosa, and tongue normal; teeth and gums normal Neck: no adenopathy, no carotid bruit, supple, symmetrical, trachea midline and thyroid not enlarged, symmetric, no tenderness/mass/nodules Back: symmetric, no curvature. ROM normal. No CVA tenderness. Lungs: clear to auscultation bilaterally Heart: regular rate and rhythm, S1, S2 normal, no murmur, click, rub or gallop Abdomen: soft, non-tender; bowel sounds normal; no masses,  no organomegaly Pulses: 2+ and symmetric Skin: Skin color, texture, turgor normal. No rashes or lesions Lymph nodes: Cervical, supraclavicular, and axillary nodes normal. Neuro:  awake and interactive with normal mood and affect. Higher cortical functions are normal. Speech is clear without word-finding difficulty or dysarthria. Extraocular movements are intact. Visual fields of both eyes are grossly intact. Sensation to light touch is grossly intact bilaterally of upper and lower extremities. Motor examination shows 4+/5 symmetric hand grip and upper extremity and 5/5 lower extremity strength. There is no pronation or drift. Gait is non-ataxic   Lab Results  Component Value Date   HGBA1C 6.5 10/04/2021   HGBA1C 7.1 (H) 03/16/2021   HGBA1C 7.4 (H) 11/29/2020    Lab Results  Component Value Date   CREATININE 0.73 10/04/2021   CREATININE 0.72 03/16/2021   CREATININE 0.66 11/29/2020    Lab Results  Component Value Date   WBC 5.9 02/07/2020   HGB 13.2 02/07/2020   HCT 39.0 02/07/2020   PLT 289 02/07/2020   GLUCOSE 104 (H) 10/04/2021   CHOL 183 10/04/2021    TRIG 190 (H) 10/04/2021   HDL 55 10/04/2021   LDLDIRECT 125.0 11/29/2020   LDLCALC 100 (H) 10/04/2021   ALT 39 (H) 10/04/2021   AST 33 10/04/2021   NA 139 10/04/2021   K 3.6 10/04/2021   CL 96 10/04/2021   CREATININE 0.73 10/04/2021   BUN 15 10/04/2021   CO2 32 10/04/2021   TSH 2.97 10/04/2021   INR 0.9 02/07/2020   HGBA1C 6.5 10/04/2021   MICROALBUR <0.7 10/04/2021    MM DIAG BREAST TOMO UNI LEFT  Result Date: 12/25/2021 CLINICAL DATA:  Screening recall for possible left breast distortion. EXAM: DIGITAL DIAGNOSTIC UNILATERAL LEFT MAMMOGRAM WITH TOMOSYNTHESIS; ULTRASOUND LEFT BREAST LIMITED TECHNIQUE: Left digital diagnostic mammography and breast tomosynthesis was performed.; Targeted ultrasound examination of the left breast was performed. COMPARISON:  Previous exam(s). ACR Breast Density Category d: The breast tissue is extremely dense, which lowers the sensitivity of mammography. FINDINGS: Additional tomograms were performed of the left breast. There  is a persistent area of possible subtle distortion in the upper-outer posterior left breast. Targeted ultrasound of the upper-outer left breast was performed demonstrating extremely dense fibroglandular tissue. No definite sonographic correlate for the possible subtle distortion seen in the upper-outer posterior left breast. No lymphadenopathy seen in the left axilla. IMPRESSION: Possible subtle distortion in the upper-outer posterior left breast. RECOMMENDATION: Recommend stereotactic guided biopsy of the subtle distortion in the upper-outer posterior left breast. This is a subtle/questionable finding and if this cannot be reproduced on the day the patient returns for biopsy then short-term six-month follow-up mammography of the left breast would be recommended. I have discussed the findings and recommendations with the patient. If applicable, a reminder letter will be sent to the patient regarding the next appointment. BI-RADS CATEGORY  4:  Suspicious. Electronically Signed   By: Everlean Alstrom M.D.   On: 12/25/2021 11:38  US BREAST LTD UNI LEFT INC AXILLA  Result Date: 12/25/2021 CLINICAL DATA:  Screening recall for possible left breast distortion. EXAM: DIGITAL DIAGNOSTIC UNILATERAL LEFT MAMMOGRAM WITH TOMOSYNTHESIS; ULTRASOUND LEFT BREAST LIMITED TECHNIQUE: Left digital diagnostic mammography and breast tomosynthesis was performed.; Targeted ultrasound examination of the left breast was performed. COMPARISON:  Previous exam(s). ACR Breast Density Category d: The breast tissue is extremely dense, which lowers the sensitivity of mammography. FINDINGS: Additional tomograms were performed of the left breast. There is a persistent area of possible subtle distortion in the upper-outer posterior left breast. Targeted ultrasound of the upper-outer left breast was performed demonstrating extremely dense fibroglandular tissue. No definite sonographic correlate for the possible subtle distortion seen in the upper-outer posterior left breast. No lymphadenopathy seen in the left axilla. IMPRESSION: Possible subtle distortion in the upper-outer posterior left breast. RECOMMENDATION: Recommend stereotactic guided biopsy of the subtle distortion in the upper-outer posterior left breast. This is a subtle/questionable finding and if this cannot be reproduced on the day the patient returns for biopsy then short-term six-month follow-up mammography of the left breast would be recommended. I have discussed the findings and recommendations with the patient. If applicable, a reminder letter will be sent to the patient regarding the next appointment. BI-RADS CATEGORY  4: Suspicious. Electronically Signed   By: Everlean Alstrom M.D.   On: 12/25/2021 11:38   Assessment & Plan:   Problem List Items Addressed This Visit     Abnormal mammogram of left breast    Breast biopsy has been scheduled of left breast at [facility]      DM (diabetes mellitus), type 2 with  peripheral vascular complications ([place])    Complicated by hyperlipidemia, hypertension, fatty liver and obesity with OSA .  She is tolerating Mounjaro . At the lowest dose and losing weight.y   Continue ARB and rosuvastatin.  Low glycemic index diet and regular exercise encouraged .  BP is elevated today because she has not taken her medications this morning .  Continue current dose of Mounjaro  . LDL is 125 on current statin therapy  Lab Results  Component Value Date   HGBA1C 6.5 10/04/2021   Lab Results  Component Value Date   CHOL 183 10/04/2021   HDL 55 10/04/2021   LDLCALC 100 (H) 10/04/2021   LDLDIRECT 125.0 11/29/2020   TRIG 190 (H) 10/04/2021   CHOLHDL 3.3 10/04/2021   Lab Results  Component Value Date   MICROALBUR <0.7 10/04/2021   MICROALBUR <0.7 07/10/2020     Lab Results  Component Value Date   ALT 39 (H) 10/04/2021   AST  33 10/04/2021   ALKPHOS 81 10/04/2021   BILITOT 0.4 10/04/2021          Relevant Orders   Comprehensive metabolic panel   Hemoglobin A1c   Hypertension   Relevant Orders   Comprehensive metabolic panel   Hypothyroidism    Thyroid function is WNL on 88 mcg  Daily  dose.  No current changes needed.   Lab Results  Component Value Date   TSH 2.97 10/04/2021         Relevant Orders   TSH   NASH (nonalcoholic steatohepatitis)    By biopsy .  No fibrosis. Taking statin, mounjaro  and metformin.   Lab Results  Component Value Date   ALT 39 (H) 10/04/2021   AST 33 10/04/2021   ALKPHOS 81 10/04/2021   BILITOT 0.4 10/04/2021         OSA on CPAP    Diagnosed remotely with  sleep study. She is wearing her CPAP every night a minimum of 6 hours per night and notes improved daytime wakefulness and decreased fatigue       Other Visit Diagnoses     Cough productive of purulent sputum    -  Primary   Relevant Orders   POC COVID-19 (Completed)   POC Influenza A&B (Binax test) (Completed)   Mixed hyperlipidemia       Relevant Orders    Lipid panel   Acute non-recurrent maxillary sinusitis       Relevant Medications   predniSONE (DELTASONE) 10 MG tablet   amoxicillin-clavulanate (AUGMENTIN) 875-125 MG tablet       I spent a total of   minutes with this patient in a face to face visit on the date of this encounter reviewing the last office visit with me in   July,   patient's diet and exercise habits, home blood pressure /blood sugar readings, recent  diagnostic mammogram  ,   and post visit ordering of testing and therapeutics.    Follow-up: Return for follow up diabetes.   Crecencio Mc, MD

## 2022-01-04 NOTE — Assessment & Plan Note (Signed)
Will treat for sinusitis/otitis ; POC testing for flu and COVID both negative .  rx augmentin and prednisone taper

## 2022-01-04 NOTE — Assessment & Plan Note (Signed)
Complicated by hyperlipidemia, hypertension, fatty liver and obesity with OSA .  She is tolerating Mounjaro . At the lowest dose and losing weight.y   Continue ARB and rosuvastatin.  Low glycemic index diet and regular exercise encouraged .  BP is elevated today because she has not taken her medications this morning .  Continue current dose of Mounjaro  . LDL is 125 on current statin therapy  Lab Results  Component Value Date   HGBA1C 6.5 10/04/2021   Lab Results  Component Value Date   CHOL 183 10/04/2021   HDL 55 10/04/2021   LDLCALC 100 (H) 10/04/2021   LDLDIRECT 125.0 11/29/2020   TRIG 190 (H) 10/04/2021   CHOLHDL 3.3 10/04/2021   Lab Results  Component Value Date   MICROALBUR <0.7 10/04/2021   MICROALBUR <0.7 07/10/2020     Lab Results  Component Value Date   ALT 39 (H) 10/04/2021   AST 33 10/04/2021   ALKPHOS 81 10/04/2021   BILITOT 0.4 10/04/2021

## 2022-01-07 ENCOUNTER — Other Ambulatory Visit: Payer: Self-pay | Admitting: Internal Medicine

## 2022-01-07 DIAGNOSIS — E782 Mixed hyperlipidemia: Secondary | ICD-10-CM

## 2022-01-09 ENCOUNTER — Ambulatory Visit
Admission: RE | Admit: 2022-01-09 | Discharge: 2022-01-09 | Disposition: A | Discharge status: Home / Self Care | Payer: PRIVATE HEALTH INSURANCE | Source: Ambulatory Visit | Attending: Internal Medicine | Admitting: Internal Medicine

## 2022-01-09 DIAGNOSIS — R928 Other abnormal and inconclusive findings on diagnostic imaging of breast: Secondary | ICD-10-CM

## 2022-01-09 HISTORY — PX: BREAST BIOPSY: SHX20

## 2022-01-10 LAB — SURGICAL PATHOLOGY

## 2022-01-11 ENCOUNTER — Encounter: Payer: Self-pay | Admitting: *Deleted

## 2022-01-11 NOTE — Progress Notes (Signed)
Referral recieved from [facility] Radiology for benign breast lesion. Referral sent to [facility] for Dr. Barry Dienes per patient request.   That office will contact her with the appointment.   No further needs at this time.

## 2022-01-14 ENCOUNTER — Other Ambulatory Visit: Payer: Self-pay | Admitting: General Surgery

## 2022-01-14 DIAGNOSIS — N6489 Other specified disorders of breast: Secondary | ICD-10-CM

## 2022-01-22 ENCOUNTER — Other Ambulatory Visit: Payer: Self-pay | Admitting: General Surgery

## 2022-01-22 DIAGNOSIS — R92343 Mammographic extreme density, bilateral breasts: Secondary | ICD-10-CM

## 2022-02-08 ENCOUNTER — Other Ambulatory Visit: Payer: Self-pay | Admitting: Internal Medicine

## 2022-02-12 ENCOUNTER — Ambulatory Visit
Admission: RE | Admit: 2022-02-12 | Discharge: 2022-02-12 | Disposition: A | Discharge status: Home / Self Care | Payer: PRIVATE HEALTH INSURANCE | Source: Ambulatory Visit | Attending: General Surgery | Admitting: General Surgery

## 2022-02-12 DIAGNOSIS — R92343 Mammographic extreme density, bilateral breasts: Secondary | ICD-10-CM

## 2022-02-12 MED ORDER — GADOPICLENOL 0.5 MMOL/ML IV SOLN
10.0000 mL | Freq: Once | INTRAVENOUS | Status: AC | PRN
  Administered 2022-02-12: 10 mL via INTRAVENOUS

## 2022-02-19 ENCOUNTER — Other Ambulatory Visit: Payer: Self-pay | Admitting: Internal Medicine

## 2022-02-26 ENCOUNTER — Other Ambulatory Visit: Payer: Self-pay | Admitting: General Surgery

## 2022-02-26 DIAGNOSIS — N6489 Other specified disorders of breast: Secondary | ICD-10-CM

## 2022-03-13 ENCOUNTER — Other Ambulatory Visit: Payer: Self-pay | Admitting: Internal Medicine

## 2022-03-19 NOTE — Progress Notes (Signed)
Surgical Instructions    Your procedure is scheduled on Tuesday, 03/26/22.  Report to [facility] Main Entrance "A" at 7:00 A.M., then check in with the Admitting office.  Call this number if you have problems the morning of surgery:  [phone]   If you have any questions prior to your surgery date call [phone]: Open Monday-Friday 8am-4pm If you experience any cold or flu symptoms such as cough, fever, chills, shortness of breath, etc. between now and your scheduled surgery, please notify us at the above number     Remember:  Do not eat after midnight the night before your surgery  You may drink clear liquids until 6:00am the morning of your surgery.   Clear liquids allowed are: Water, Non-Citrus Juices (without pulp), Carbonated Beverages, Clear Tea, Black Coffee ONLY (NO MILK, CREAM OR POWDERED CREAMER of any kind), and Gatorade    Take these medicines the morning of surgery with A SIP OF WATER:  esomeprazole (NEXIUM)  fexofenadine (ALLEGRA)  levothyroxine (SYNTHROID)  metoprolol succinate (TOPROL-XL)  rosuvastatin (CRESTOR)  sertraline (ZOLOFT)    As of today, STOP taking any Aspirin (unless otherwise instructed by your surgeon) Aleve, Naproxen, Ibuprofen, Motrin, Advil, Goody's, BC's, all herbal medications, fish oil, and all vitamins.  WHAT DO I DO ABOUT MY DIABETES MEDICATION?   Do not take oral diabetes medicines (pills) the morning of surgery.     THE MORNING OF SURGERY, do not take metFORMIN (GLUCOPHAGE).  The day of surgery, do not take other diabetes injectables, including Byetta (exenatide), Bydureon (exenatide ER), Victoza (liraglutide), or Trulicity (dulaglutide).  If your CBG is greater than 220 mg/dL, you may take  of your sliding scale (correction) dose of insulin.   HOW TO MANAGE YOUR DIABETES BEFORE AND AFTER SURGERY  Why is it important to control my blood sugar before and after surgery? Improving blood sugar levels before and after surgery  helps healing and can limit problems. A way of improving blood sugar control is eating a healthy diet by:  Eating less sugar and carbohydrates  Increasing activity/exercise  Talking with your doctor about reaching your blood sugar goals High blood sugars (greater than 180 mg/dL) can raise your risk of infections and slow your recovery, so you will need to focus on controlling your diabetes during the weeks before surgery. Make sure that the doctor who takes care of your diabetes knows about your planned surgery including the date and location.  How do I manage my blood sugar before surgery? Check your blood sugar at least 4 times a day, starting 2 days before surgery, to make sure that the level is not too high or low.  Check your blood sugar the morning of your surgery when you wake up and every 2 hours until you get to the Short Stay unit.  If your blood sugar is less than 70 mg/dL, you will need to treat for low blood sugar: Do not take insulin. Treat a low blood sugar (less than 70 mg/dL) with  cup of clear juice (cranberry or apple), 4 glucose tablets, OR glucose gel. Recheck blood sugar in 15 minutes after treatment (to make sure it is greater than 70 mg/dL). If your blood sugar is not greater than 70 mg/dL on recheck, call [phone] for further instructions. Report your blood sugar to the short stay nurse when you get to Short Stay.  If you are admitted to the hospital after surgery: Your blood sugar will be checked by the staff and you will probably  be given insulin after surgery (instead of oral diabetes medicines) to make sure you have good blood sugar levels. The goal for blood sugar control after surgery is 80-180 mg/dL.           Do not wear jewelry or makeup. Do not wear lotions, powders, perfumes or deodorant. Do not shave 48 hours prior to surgery.   Do not bring valuables to the hospital. Do not wear nail polish, gel polish, artificial nails, or any other type of  covering on natural nails (fingers and toes) If you have artificial nails or gel coating that need to be removed by a nail salon, please have this removed prior to surgery. Artificial nails or gel coating may interfere with anesthesia's ability to adequately monitor your vital signs.  [facility] is not responsible for any belongings or valuables.    Do NOT Smoke (Tobacco/Vaping)  24 hours prior to your procedure  If you use a CPAP at night, you may bring your mask for your overnight stay.   Contacts, glasses, hearing aids, dentures or partials may not be worn into surgery, please bring cases for these belongings   For patients admitted to the hospital, discharge time will be determined by your treatment team.   Patients discharged the day of surgery will not be allowed to drive home, and someone needs to stay with them for 24 hours.   SURGICAL WAITING ROOM VISITATION Patients having surgery or a procedure may have no more than 2 support people in the waiting area - these visitors may rotate.   Children under the age of 37 must have an adult with them who is not the patient. If the patient needs to stay at the hospital during part of their recovery, the visitor guidelines for inpatient rooms apply. Pre-op nurse will coordinate an appropriate time for 1 support person to accompany patient in pre-op.  This support person may not rotate.   Please refer to [email or web address] for the visitor guidelines for Inpatients (after your surgery is over and you are in a regular room).    Special instructions:    Oral Hygiene is also important to reduce your risk of infection.  Remember - BRUSH YOUR TEETH THE MORNING OF SURGERY WITH YOUR REGULAR TOOTHPASTE   [facility]- Preparing For Surgery  Before surgery, you can play an important role. Because skin is not sterile, your skin needs to be as free of germs as possible. You can reduce the number of  germs on your skin by washing with CHG (chlorahexidine gluconate) Soap before surgery.  CHG is an antiseptic cleaner which kills germs and bonds with the skin to continue killing germs even after washing.     Please do not use if you have an allergy to CHG or antibacterial soaps. If your skin becomes reddened/irritated stop using the CHG.  Do not shave (including legs and underarms) for at least 48 hours prior to first CHG shower. It is OK to shave your face.  Please follow these instructions carefully.     Shower the NIGHT BEFORE SURGERY and the MORNING OF SURGERY with CHG Soap.   If you chose to wash your hair, wash your hair first as usual with your normal shampoo. After you shampoo, rinse your hair and body thoroughly to remove the shampoo.  Then [employer] and genitals (private parts) with your normal soap and rinse thoroughly to remove soap.  After that Use CHG Soap as you would any other liquid soap. You can  apply CHG directly to the skin and wash gently with a scrungie or a clean washcloth.   Apply the CHG Soap to your body ONLY FROM THE NECK DOWN.  Do not use on open wounds or open sores. Avoid contact with your eyes, ears, mouth and genitals (private parts). Wash Face and genitals (private parts)  with your normal soap.   Wash thoroughly, paying special attention to the area where your surgery will be performed.  Thoroughly rinse your body with warm water from the neck down.  DO NOT shower/wash with your normal soap after using and rinsing off the CHG Soap.  Pat yourself dry with a CLEAN TOWEL.  Wear CLEAN PAJAMAS to bed the night before surgery  Place CLEAN SHEETS on your bed the night before your surgery  DO NOT SLEEP WITH PETS.   Day of Surgery: Take a shower with CHG soap. Wear Clean/Comfortable clothing the morning of surgery Do not apply any deodorants/lotions.   Remember to brush your teeth WITH YOUR REGULAR TOOTHPASTE.    If you received a COVID test during  your pre-op visit, it is requested that you wear a mask when out in public, stay away from anyone that may not be feeling well, and notify your surgeon if you develop symptoms. If you have been in contact with anyone that has tested positive in the last 10 days, please notify your surgeon.    Please read over the following fact sheets that you were given.

## 2022-03-20 ENCOUNTER — Encounter (HOSPITAL_COMMUNITY): Payer: Self-pay

## 2022-03-20 ENCOUNTER — Other Ambulatory Visit: Payer: Self-pay

## 2022-03-20 ENCOUNTER — Encounter (HOSPITAL_COMMUNITY)
Admission: RE | Admit: 2022-03-20 | Discharge: 2022-03-20 | Disposition: A | Discharge status: Home / Self Care | Payer: Commercial Managed Care - PPO | Source: Ambulatory Visit | Attending: General Surgery | Admitting: General Surgery

## 2022-03-20 VITALS — BP 131/90 | HR 75 | Temp 98.4°F | Resp 18 | Ht 63.0 in | Wt 216.8 lb

## 2022-03-20 DIAGNOSIS — Z01818 Encounter for other preprocedural examination: Secondary | ICD-10-CM | POA: Insufficient documentation

## 2022-03-20 DIAGNOSIS — E1151 Type 2 diabetes mellitus with diabetic peripheral angiopathy without gangrene: Secondary | ICD-10-CM | POA: Insufficient documentation

## 2022-03-20 HISTORY — DX: Fatty (change of) liver, not elsewhere classified: K76.0

## 2022-03-20 HISTORY — DX: Hypothyroidism, unspecified: E03.9

## 2022-03-20 LAB — BASIC METABOLIC PANEL
Anion gap: 9 (ref 5–15)
BUN: 11 mg/dL (ref 6–20)
CO2: 27 mmol/L (ref 22–32)
Calcium: 9.4 mg/dL (ref 8.9–10.3)
Chloride: 100 mmol/L (ref 98–111)
Creatinine, Ser: 0.82 mg/dL (ref 0.44–1.00)
GFR, Estimated: >60 mL/min (ref >60)
Glucose, Bld: 197 mg/dL — ABNORMAL HIGH (ref 70–99)
Potassium: 3.5 mmol/L (ref 3.5–5.1)
Sodium: 136 mmol/L (ref 135–145)

## 2022-03-20 LAB — CBC
HCT: 37.3 % (ref 36.0–46.0)
Hemoglobin: 12.9 g/dL (ref 12.0–15.0)
MCH: 30.2 pg (ref 26.0–34.0)
MCHC: 34.6 g/dL (ref 30.0–36.0)
MCV: 87.4 fL (ref 80.0–100.0)
Platelets: 280 10*3/uL (ref 150–400)
RBC: 4.27 MIL/uL (ref 3.87–5.11)
RDW: 13.5 % (ref 11.5–15.5)
WBC: 7.7 10*3/uL (ref 4.0–10.5)
nRBC: 0 % (ref 0.0–0.2)

## 2022-03-20 LAB — GLUCOSE, CAPILLARY: Glucose-Capillary: 199 mg/dL — ABNORMAL HIGH (ref 70–99)

## 2022-03-20 LAB — HEMOGLOBIN A1C
Hgb A1c MFr Bld: 7.5 % — ABNORMAL HIGH (ref 4.8–5.6)
Mean Plasma Glucose: 169 mg/dL

## 2022-03-20 NOTE — Progress Notes (Signed)
PCP - Deborra Medina Cardiologist - [facility] in [place]  PPM/ICD - denies   Chest x-ray - n/a EKG - 03/20/22 Stress Test - 2016-care everywhere ECHO - 2016-care everywhere Cardiac Cath - denies  Sleep Study - +OSA CPAP - wears nighly  Fasting Blood Sugar - 160s Checks Blood Sugar 2  times a day    As of today, STOP taking any Aspirin (unless otherwise instructed by your surgeon) Aleve, Naproxen, Ibuprofen, Motrin, Advil, Goody's, BC's, all herbal medications, fish oil, and all vitamins.   ERAS Protcol -yes PRE-SURGERY Ensure or G2- none ordered  COVID TEST- not needed   Anesthesia review: yes,  please review cardiac tests  Patient denies shortness of breath, fever, cough and chest pain at PAT appointment   All instructions explained to the patient, with a verbal understanding of the material. Patient agrees to go over the instructions while at home for a better understanding. Patient also instructed to self quarantine after being tested for COVID-19. The opportunity to ask questions was provided.

## 2022-03-25 ENCOUNTER — Ambulatory Visit
Admission: RE | Admit: 2022-03-25 | Discharge: 2022-03-25 | Disposition: A | Discharge status: Home / Self Care | Payer: Commercial Managed Care - PPO | Source: Ambulatory Visit | Attending: General Surgery | Admitting: General Surgery

## 2022-03-25 DIAGNOSIS — N6489 Other specified disorders of breast: Secondary | ICD-10-CM

## 2022-03-25 HISTORY — PX: BREAST BIOPSY: SHX20

## 2022-03-25 NOTE — H&P (Signed)
REFERRING PHYSICIAN:  Deborra Medina   PROVIDER:  Georgianne Fick, MD   Care Team: Patient Care Team: Jannifer Hick, MD as PCP - General (Internal Medicine) Georgianne Fick, MD as Consulting Provider (Surgical Oncology)    MRN: [id number] DOB: 03/31/1970    Subjective    Chief Complaint: Breast Problem       History of Present Illness: Christina Stafford is a 52 y.o. female who is seen today as an office consultation at the request of Dr. Derrel Nip for evaluation of Breast Problem   Patient presents with a new diagnosis of a radial scar in the left breast.  Patient underwent screening detected mammography and had a possible left breast distortion.  Diagnostic imaging was performed.  This showed a persistent area of subtle distortion in the upper outer posterior left breast.  There was no ultrasound correlate.  She underwent core needle biopsy and had changes suggestive of radial scar.  She presents to discuss excision.   Family cancer history: Patient has a family history of uterine cancer in her mother, maternal grandfather had some sort of intra-abdominal cancer, father had bone cancer of his sternum.   Work: Patient works as a Teacher, English as a foreign language mammogram/us:12/25/2021 ACR Breast Density Category d: The breast tissue is extremely dense, which lowers the sensitivity of mammography.   FINDINGS: Additional tomograms were performed of the left breast. There is a persistent area of possible subtle distortion in the upper-outer posterior left breast.   Targeted ultrasound of the upper-outer left breast was performed demonstrating extremely dense fibroglandular tissue. No definite sonographic correlate for the possible subtle distortion seen in the upper-outer posterior left breast. No lymphadenopathy seen in the left axilla.   IMPRESSION: Possible subtle distortion in the upper-outer posterior left breast.    RECOMMENDATION: Recommend stereotactic guided biopsy of the subtle distortion in the upper-outer posterior left breast.   This is a subtle/questionable finding and if this cannot be reproduced on the day the patient returns for biopsy then short-term six-month follow-up mammography of the left breast would be recommended.   I have discussed the findings and recommendations with the patient. If applicable, a reminder letter will be sent to the patient regarding the next appointment.   BI-RADS CATEGORY  4: Suspicious.     Pathology core needle biopsy: 01/09/2022 DIAGNOSIS:  A. BREAST, LEFT UPPER OUTER QUADRANT; STEREOTACTIC CORE NEEDLE BIOPSY:  - BENIGN MAMMARY PARENCHYMA WITH DENSE STROMAL FIBROSIS AND FOCAL  CHANGES SUGGESTIVE OF RADIAL SCAR.  - BACKGROUND FOCAL FIBROADENOMATOID CHANGES AND USUAL DUCTAL  HYPERPLASIA.  - FOCAL MICROCALCIFICATIONS ASSOCIATED WITH BENIGN MAMMARY ELEMENTS.  - NEGATIVE FOR ATYPICAL PROLIFERATIVE BREAST DISEASE   Review of Systems: A complete review of systems was obtained from the patient.  I have reviewed this information and discussed as appropriate with the patient.  See HPI as well for other ROS.   ROS o/w negative     Medical History: Past Medical History      Past Medical History:  Diagnosis Date   Anxiety     Depression     GERD (gastroesophageal reflux disease)     Hyperlipidemia     Hypertension             Patient Active Problem List  Diagnosis   Major depressive disorder, single episode   Esophageal reflux   Familial multiple lipoprotein-type hyperlipidemia   Obesity   Obstructive sleep apnea   Palpitations   Panic disorder without agoraphobia  Radial scar of left breast   Mammographic extreme density, bilateral breasts   Family history of uterine cancer   Family history of bone cancer   Family history of cancer      Past Surgical History       Past Surgical History:  Procedure Laterality Date   hysterectomy    2019   CHOLECYSTECTOMY            Allergies  No Known Allergies           Current Outpatient Medications on File Prior to Visit  Medication Sig Dispense Refill   ALPRAZolam (XANAX) 0.5 MG tablet Take 0.5 mg by mouth 2 (two) times daily as needed.       busPIRone (BUSPAR) 5 MG tablet Take 5 mg by mouth 3 (three) times daily.       cetirizine (ZYRTEC) 10 MG tablet Take 10 mg by mouth once daily.       fexofenadine (ALLEGRA) 60 MG tablet Take 60 mg by mouth 2 (two) times daily       hydroCHLOROthiazide (MICROZIDE) 12.5 mg capsule TAKE 1 CAPSULE (12.5 MG) BY MOUTH EVERY DAY       levothyroxine (SYNTHROID) 88 MCG tablet Take 1 tablet by mouth every morning before breakfast (0630)       losartan-hydroCHLOROthiazide (HYZAAR) 50-12.5 mg tablet Take 1 tablet by mouth once daily       metFORMIN (GLUCOPHAGE) 500 MG tablet Take 1 tablet by mouth 2 (two) times daily with meals       metoprolol succinate (TOPROL-XL) 25 MG XL tablet TAKE ONE TABLET (25 MG) BY MOUTH EVERY DAY       omeprazole (PRILOSEC OTC) 20 MG tablet Take 20 mg by mouth once daily.       rosuvastatin (CRESTOR) 20 MG tablet Take 1 tablet by mouth once daily       sertraline (ZOLOFT) 50 MG tablet TAKE ONE AND A HALF TABLETS (75 MG) BY MOUTH EVERY DAY        No current facility-administered medications on file prior to visit.      Family History       Family History  Problem Relation Age of Onset   Obesity Mother     Hyperlipidemia (Elevated cholesterol) Mother     High blood pressure (Hypertension) Mother     Diabetes Mother     Obesity Father     Diabetes Father     Hyperlipidemia (Elevated cholesterol) Father     Skin cancer Father     Hyperlipidemia (Elevated cholesterol) Sister          Social History       Tobacco Use  Smoking Status Former  Smokeless Tobacco Never      Social History  Social History         Socioeconomic History   Marital status: Married  Tobacco Use   Smoking status: Former    Smokeless tobacco: Never  Substance and Sexual Activity   Alcohol use: Yes      Alcohol/week: 3.0 standard drinks of alcohol      Types: 3 Glasses of wine per week   Drug use: Yes      Types: Marijuana      Comment: 0.3% gummy THC        Objective:         Vitals:      BP: 128/72  Pulse: 79  Temp: 36.2 C (97.2 F)  SpO2: 98%  Weight: 97 kg (  213 lb 12.8 oz)  Height: 160 cm ('5\' 3"'$ )    Body mass index is 37.87 kg/m.   Gen:  No acute distress.  Well nourished and well groomed.   Neurological: Alert and oriented to person, place, and time. Coordination normal.  Head: Normocephalic and atraumatic.  Eyes: Conjunctivae are normal. Pupils are equal, round, and reactive to light. No scleral icterus.  Neck: Normal range of motion. Neck supple. No tracheal deviation or thyromegaly present.  Cardiovascular: Normal rate, regular rhythm, normal heart sounds and intact distal pulses.  Exam reveals no gallop and no friction rub.  No murmur heard. Breast: Breasts are reasonably symmetric.  Biopsy site in the upper outer quadrant of the left breast.  There is faint healing bruising.  She has no palpable masses, no skin dimpling, no nipple retraction, no nipple discharge, no lymphadenopathy. Respiratory: Effort normal.  No respiratory distress. No chest wall tenderness. Breath sounds normal.  No wheezes, rales or rhonchi.  GI: Soft. Bowel sounds are normal. The abdomen is soft and nontender.  There is no rebound and no guarding.  Musculoskeletal: Normal range of motion. Extremities are nontender.  Lymphadenopathy: No cervical, preauricular, postauricular or axillary adenopathy is present Skin: Skin is warm and dry. No rash noted. No diaphoresis. No erythema. No pallor. No clubbing, cyanosis, or edema.   Psychiatric: Normal mood and affect. Behavior is normal. Judgment and thought content normal.      Labs No recent labs   Assessment and Plan:        ICD-10-CM    1. Mammographic extreme  density, bilateral breasts  R92.343 MRI breast bilateral with and without contrast     2. Radial scar of left breast  N64.89 MRI breast bilateral with and without contrast     3. Family history of bone cancer  Z80.8       4. Family history of cancer  Z80.9       5. Family history of uterine cancer  Z80.49 MRI breast bilateral with and without contrast       Patient will need excision of this area of radial scar.  Given her breast density D and significant decrease in sensitivity of mammography, I got a breast MRI which was negative for other lesions.  Now, we will do a seed localized excisional left breast biopsy.  I discussed this with the patient.   The surgical procedure was described to the patient.  I discussed the incision type and location and that we will need radiology involved on with a wire or seed marker and/or sentinel node.       We discussed the risks bleeding, infection, damage to other structures, need for further procedures/surgeries.  We discussed the risk of seroma.  The patient was advised if the breast has cancer, we may need to go back to surgery for additional tissue to obtain negative margins or for a lymph node biopsy. The patient was advised that these are the most common complications, but that others can occur as well. I discussed the risk of alteration in breast contour or size.  I discussed risk of chronic pain.  There are rare instances of heart/lung issues post op as well as blood clots.      They were advised against taking aspirin or other anti-inflammatory agents/blood thinners the week before surgery.     The risks and benefits of the procedure were described to the patient and she wishes to proceed.

## 2022-03-26 ENCOUNTER — Encounter (HOSPITAL_COMMUNITY)
Admission: RE | Disposition: A | Discharge status: Home / Self Care | Payer: Self-pay | Source: Ambulatory Visit | Attending: General Surgery

## 2022-03-26 ENCOUNTER — Ambulatory Visit
Admission: RE | Admit: 2022-03-26 | Discharge: 2022-03-26 | Disposition: A | Discharge status: Home / Self Care | Payer: PRIVATE HEALTH INSURANCE | Source: Ambulatory Visit | Attending: General Surgery | Admitting: General Surgery

## 2022-03-26 ENCOUNTER — Ambulatory Visit (HOSPITAL_BASED_OUTPATIENT_CLINIC_OR_DEPARTMENT_OTHER): Payer: Commercial Managed Care - PPO | Admitting: Physician Assistant

## 2022-03-26 ENCOUNTER — Other Ambulatory Visit: Payer: Self-pay | Admitting: Internal Medicine

## 2022-03-26 ENCOUNTER — Encounter (HOSPITAL_COMMUNITY): Payer: Self-pay | Admitting: General Surgery

## 2022-03-26 ENCOUNTER — Other Ambulatory Visit: Payer: Self-pay

## 2022-03-26 ENCOUNTER — Ambulatory Visit (HOSPITAL_COMMUNITY)
Admission: RE | Admit: 2022-03-26 | Discharge: 2022-03-26 | Disposition: A | Discharge status: Home / Self Care | Payer: Commercial Managed Care - PPO | Source: Ambulatory Visit | Attending: General Surgery | Admitting: General Surgery

## 2022-03-26 ENCOUNTER — Ambulatory Visit (HOSPITAL_COMMUNITY): Payer: Commercial Managed Care - PPO | Admitting: Physician Assistant

## 2022-03-26 DIAGNOSIS — Z87891 Personal history of nicotine dependence: Secondary | ICD-10-CM | POA: Diagnosis not present

## 2022-03-26 DIAGNOSIS — N6489 Other specified disorders of breast: Secondary | ICD-10-CM

## 2022-03-26 DIAGNOSIS — E039 Hypothyroidism, unspecified: Secondary | ICD-10-CM | POA: Insufficient documentation

## 2022-03-26 DIAGNOSIS — N6022 Fibroadenosis of left breast: Secondary | ICD-10-CM | POA: Diagnosis not present

## 2022-03-26 DIAGNOSIS — N6032 Fibrosclerosis of left breast: Secondary | ICD-10-CM | POA: Insufficient documentation

## 2022-03-26 DIAGNOSIS — F419 Anxiety disorder, unspecified: Secondary | ICD-10-CM | POA: Insufficient documentation

## 2022-03-26 DIAGNOSIS — Z7984 Long term (current) use of oral hypoglycemic drugs: Secondary | ICD-10-CM | POA: Insufficient documentation

## 2022-03-26 DIAGNOSIS — K219 Gastro-esophageal reflux disease without esophagitis: Secondary | ICD-10-CM | POA: Diagnosis not present

## 2022-03-26 DIAGNOSIS — Z8049 Family history of malignant neoplasm of other genital organs: Secondary | ICD-10-CM | POA: Insufficient documentation

## 2022-03-26 DIAGNOSIS — E1151 Type 2 diabetes mellitus with diabetic peripheral angiopathy without gangrene: Secondary | ICD-10-CM

## 2022-03-26 DIAGNOSIS — Z79899 Other long term (current) drug therapy: Secondary | ICD-10-CM | POA: Insufficient documentation

## 2022-03-26 DIAGNOSIS — G4733 Obstructive sleep apnea (adult) (pediatric): Secondary | ICD-10-CM | POA: Diagnosis not present

## 2022-03-26 DIAGNOSIS — E119 Type 2 diabetes mellitus without complications: Secondary | ICD-10-CM | POA: Diagnosis not present

## 2022-03-26 DIAGNOSIS — F32A Depression, unspecified: Secondary | ICD-10-CM | POA: Insufficient documentation

## 2022-03-26 DIAGNOSIS — I10 Essential (primary) hypertension: Secondary | ICD-10-CM | POA: Insufficient documentation

## 2022-03-26 HISTORY — PX: BREAST LUMPECTOMY WITH RADIOACTIVE SEED LOCALIZATION: SHX6424

## 2022-03-26 LAB — GLUCOSE, CAPILLARY
Glucose-Capillary: 183 mg/dL — ABNORMAL HIGH (ref 70–99)
Glucose-Capillary: 144 mg/dL — ABNORMAL HIGH (ref 70–99)
Glucose-Capillary: 153 mg/dL — ABNORMAL HIGH (ref 70–99)

## 2022-03-26 SURGERY — BREAST LUMPECTOMY WITH RADIOACTIVE SEED LOCALIZATION
Anesthesia: General | Site: Breast | Laterality: Left

## 2022-03-26 MED ORDER — ACETAMINOPHEN 500 MG PO TABS: 1000.0000 mg | ORAL_TABLET | Freq: Once | ORAL | Status: DC | PRN

## 2022-03-26 MED ORDER — OXYCODONE HCL 5 MG PO TABS: 5.0000 mg | ORAL_TABLET | Freq: Once | ORAL | Status: DC | PRN

## 2022-03-26 MED ORDER — ONDANSETRON HCL 4 MG/2ML IJ SOLN
INTRAMUSCULAR | Status: DC | PRN
  Administered 2022-03-26: 4 mg via INTRAVENOUS

## 2022-03-26 MED ORDER — ACETAMINOPHEN 10 MG/ML IV SOLN: 1000.0000 mg | Freq: Once | INTRAVENOUS | Status: DC | PRN

## 2022-03-26 MED ORDER — FENTANYL CITRATE (PF) 250 MCG/5ML IJ SOLN
INTRAMUSCULAR | Status: AC
  Filled 2022-03-26: qty 5

## 2022-03-26 MED ORDER — LIDOCAINE 2% (20 MG/ML) 5 ML SYRINGE
INTRAMUSCULAR | Status: AC
  Filled 2022-03-26: qty 5

## 2022-03-26 MED ORDER — CHLORHEXIDINE GLUCONATE CLOTH 2 % EX PADS: 6.0000 | MEDICATED_PAD | Freq: Once | CUTANEOUS | Status: DC

## 2022-03-26 MED ORDER — OXYCODONE HCL 5 MG/5ML PO SOLN: 5.0000 mg | Freq: Once | ORAL | Status: DC | PRN

## 2022-03-26 MED ORDER — OXYCODONE HCL 5 MG PO TABS: 5.0000 mg | ORAL_TABLET | Freq: Four times a day (QID) | ORAL | 0 refills | Status: DC | PRN

## 2022-03-26 MED ORDER — ACETAMINOPHEN 160 MG/5ML PO SOLN: 1000.0000 mg | Freq: Once | ORAL | Status: DC | PRN

## 2022-03-26 MED ORDER — LACTATED RINGERS IV SOLN: INTRAVENOUS | Status: DC

## 2022-03-26 MED ORDER — MIDAZOLAM HCL 2 MG/2ML IJ SOLN
INTRAMUSCULAR | Status: AC
  Filled 2022-03-26: qty 2

## 2022-03-26 MED ORDER — PHENYLEPHRINE 80 MCG/ML (10ML) SYRINGE FOR IV PUSH (FOR BLOOD PRESSURE SUPPORT)
PREFILLED_SYRINGE | INTRAVENOUS | Status: DC | PRN
  Administered 2022-03-26: 80 ug via INTRAVENOUS

## 2022-03-26 MED ORDER — FENTANYL CITRATE (PF) 100 MCG/2ML IJ SOLN
INTRAMUSCULAR | Status: AC
  Filled 2022-03-26: qty 2

## 2022-03-26 MED ORDER — ONDANSETRON HCL 4 MG/2ML IJ SOLN
INTRAMUSCULAR | Status: AC
  Filled 2022-03-26: qty 2

## 2022-03-26 MED ORDER — BUPIVACAINE HCL (PF) 0.25 % IJ SOLN
INTRAMUSCULAR | Status: AC
  Filled 2022-03-26: qty 30

## 2022-03-26 MED ORDER — LIDOCAINE-EPINEPHRINE 1 %-1:100000 IJ SOLN
INTRAMUSCULAR | Status: AC
  Filled 2022-03-26: qty 1

## 2022-03-26 MED ORDER — CHLORHEXIDINE GLUCONATE 0.12 % MT SOLN
15.0000 mL | Freq: Once | OROMUCOSAL | Status: AC
  Administered 2022-03-26: 15 mL via OROMUCOSAL
  Filled 2022-03-26: qty 15

## 2022-03-26 MED ORDER — DEXAMETHASONE SODIUM PHOSPHATE 10 MG/ML IJ SOLN
INTRAMUSCULAR | Status: DC | PRN
  Administered 2022-03-26: 5 mg via INTRAVENOUS

## 2022-03-26 MED ORDER — 0.9 % SODIUM CHLORIDE (POUR BTL) OPTIME
TOPICAL | Status: DC | PRN
  Administered 2022-03-26: 1000 mL

## 2022-03-26 MED ORDER — LIDOCAINE 2% (20 MG/ML) 5 ML SYRINGE
INTRAMUSCULAR | Status: DC | PRN
  Administered 2022-03-26: 20 mg via INTRAVENOUS

## 2022-03-26 MED ORDER — PROPOFOL 10 MG/ML IV BOLUS
INTRAVENOUS | Status: DC | PRN
  Administered 2022-03-26: 200 mg via INTRAVENOUS

## 2022-03-26 MED ORDER — MIDAZOLAM HCL 2 MG/2ML IJ SOLN
INTRAMUSCULAR | Status: DC | PRN
  Administered 2022-03-26 (×2): 1 mg via INTRAVENOUS

## 2022-03-26 MED ORDER — DEXAMETHASONE SODIUM PHOSPHATE 10 MG/ML IJ SOLN
INTRAMUSCULAR | Status: AC
  Filled 2022-03-26: qty 1

## 2022-03-26 MED ORDER — ACETAMINOPHEN 500 MG PO TABS
1000.0000 mg | ORAL_TABLET | ORAL | Status: AC
  Administered 2022-03-26: 1000 mg via ORAL
  Filled 2022-03-26: qty 2

## 2022-03-26 MED ORDER — FENTANYL CITRATE (PF) 250 MCG/5ML IJ SOLN
INTRAMUSCULAR | Status: DC | PRN
  Administered 2022-03-26 (×3): 25 ug via INTRAVENOUS

## 2022-03-26 MED ORDER — FENTANYL CITRATE (PF) 100 MCG/2ML IJ SOLN
25.0000 ug | INTRAMUSCULAR | Status: DC | PRN
  Administered 2022-03-26 (×2): 25 ug via INTRAVENOUS

## 2022-03-26 MED ORDER — PROPOFOL 500 MG/50ML IV EMUL
INTRAVENOUS | Status: DC | PRN
  Administered 2022-03-26: 200 ug/kg/min via INTRAVENOUS

## 2022-03-26 MED ORDER — INSULIN ASPART 100 UNIT/ML IJ SOLN
0.0000 [IU] | INTRAMUSCULAR | Status: DC | PRN
  Administered 2022-03-26: 4 [IU] via SUBCUTANEOUS
  Filled 2022-03-26: qty 1

## 2022-03-26 MED ORDER — PROPOFOL 10 MG/ML IV BOLUS
INTRAVENOUS | Status: AC
  Filled 2022-03-26: qty 20

## 2022-03-26 MED ORDER — CEFAZOLIN SODIUM-DEXTROSE 2-4 GM/100ML-% IV SOLN
2.0000 g | INTRAVENOUS | Status: AC
  Administered 2022-03-26: 2 g via INTRAVENOUS
  Filled 2022-03-26: qty 100

## 2022-03-26 MED ORDER — LIDOCAINE-EPINEPHRINE 1 %-1:100000 IJ SOLN
INTRAMUSCULAR | Status: DC | PRN
  Administered 2022-03-26: 30 mL

## 2022-03-26 MED ORDER — PHENYLEPHRINE 80 MCG/ML (10ML) SYRINGE FOR IV PUSH (FOR BLOOD PRESSURE SUPPORT)
PREFILLED_SYRINGE | INTRAVENOUS | Status: AC
  Filled 2022-03-26: qty 10

## 2022-03-26 MED ORDER — ORAL CARE MOUTH RINSE: 15.0000 mL | Freq: Once | OROMUCOSAL | Status: AC

## 2022-03-26 SURGICAL SUPPLY — 37 items
BAG COUNTER SPONGE SURGICOUNT (BAG) ×1 IMPLANT
BLADE SURG 10 STRL SS (BLADE) ×1 IMPLANT
CANISTER SUCT 3000ML PPV (MISCELLANEOUS) IMPLANT
CHLORAPREP W/TINT 26 (MISCELLANEOUS) ×1 IMPLANT
CLIP VESOCCLUDE LG 6/CT (CLIP) ×1 IMPLANT
COVER PROBE W GEL 5X96 (DRAPES) ×1 IMPLANT
COVER SURGICAL LIGHT HANDLE (MISCELLANEOUS) ×1 IMPLANT
DERMABOND ADVANCED .7 DNX12 (GAUZE/BANDAGES/DRESSINGS) ×1 IMPLANT
DEVICE DUBIN SPECIMEN MAMMOGRA (MISCELLANEOUS) ×1 IMPLANT
DRAPE CHEST BREAST 15X10 FENES (DRAPES) ×1 IMPLANT
ELECT BLADE 4.0 EZ CLEAN MEGAD (MISCELLANEOUS) ×1
ELECT COATED BLADE 2.86 ST (ELECTRODE) ×1 IMPLANT
ELECT REM PT RETURN 9FT ADLT (ELECTROSURGICAL) ×1
ELECTRODE BLDE 4.0 EZ CLN MEGD (MISCELLANEOUS) IMPLANT
ELECTRODE REM PT RTRN 9FT ADLT (ELECTROSURGICAL) ×1 IMPLANT
GAUZE PAD ABD 8X10 STRL (GAUZE/BANDAGES/DRESSINGS) ×1 IMPLANT
GAUZE SPONGE 4X4 12PLY STRL LF (GAUZE/BANDAGES/DRESSINGS) ×1 IMPLANT
GLOVE BIO SURGEON STRL SZ 6 (GLOVE) ×1 IMPLANT
GLOVE INDICATOR 6.5 STRL GRN (GLOVE) ×1 IMPLANT
GOWN STRL REUS W/ TWL LRG LVL3 (GOWN DISPOSABLE) ×1 IMPLANT
GOWN STRL REUS W/TWL 2XL LVL3 (GOWN DISPOSABLE) ×1 IMPLANT
GOWN STRL REUS W/TWL LRG LVL3 (GOWN DISPOSABLE) ×1
KIT BASIN OR (CUSTOM PROCEDURE TRAY) ×1 IMPLANT
KIT MARKER MARGIN INK (KITS) ×1 IMPLANT
NDL HYPO 25GX1X1/2 BEV (NEEDLE) ×1 IMPLANT
NEEDLE HYPO 25GX1X1/2 BEV (NEEDLE) ×1 IMPLANT
NS IRRIG 1000ML POUR BTL (IV SOLUTION) IMPLANT
PACK GENERAL/GYN (CUSTOM PROCEDURE TRAY) ×1 IMPLANT
STRIP CLOSURE SKIN 1/2X4 (GAUZE/BANDAGES/DRESSINGS) ×1 IMPLANT
SUT MNCRL AB 4-0 PS2 18 (SUTURE) ×1 IMPLANT
SUT VIC AB 2-0 SH 27 (SUTURE) ×1
SUT VIC AB 2-0 SH 27XBRD (SUTURE) ×1 IMPLANT
SUT VIC AB 3-0 SH 27 (SUTURE) ×1
SUT VIC AB 3-0 SH 27X BRD (SUTURE) ×1 IMPLANT
SYR CONTROL 10ML LL (SYRINGE) ×1 IMPLANT
TOWEL GREEN STERILE (TOWEL DISPOSABLE) ×1 IMPLANT
TOWEL GREEN STERILE FF (TOWEL DISPOSABLE) ×1 IMPLANT

## 2022-03-26 NOTE — Transfer of Care (Signed)
Immediate Anesthesia Transfer of Care Note  Patient: Christina Stafford  Procedure(s) Performed: LEFT BREAST LUMPECTOMY WITH RADIOACTIVE SEED LOCALIZATION (Left: Breast)  Patient Location: PACU  Anesthesia Type:General  Level of Consciousness: drowsy and patient cooperative  Airway & Oxygen Therapy: Patient Spontanous Breathing and Patient connected to face mask oxygen  Post-op Assessment: Report given to RN and Post -op Vital signs reviewed and stable  Post vital signs: Reviewed and stable  Last Vitals:  Vitals Value Taken Time  BP 115/79 03/26/22 1033  Temp    Pulse 77 03/26/22 1036  Resp 13 03/26/22 1036  SpO2 98 % 03/26/22 1036  Vitals shown include unvalidated device data.  Last Pain:  Vitals:   03/26/22 0731  TempSrc:   PainSc: 0-No pain      Patients Stated Pain Goal: 0 (47/15/80 [id number])  Complications: No notable events documented.

## 2022-03-26 NOTE — Interval H&P Note (Signed)
History and Physical Interval Note:  03/26/2022 7:50 AM  Augusto Garbe Wild  has presented today for surgery, with the diagnosis of LEFT BREAST RADIAL SCAR.  The various methods of treatment have been discussed with the patient and family. After consideration of risks, benefits and other options for treatment, the patient has consented to  Procedure(s): LEFT BREAST LUMPECTOMY WITH RADIOACTIVE SEED LOCALIZATION (Left) as a surgical intervention.  The patient's history has been reviewed, patient examined, no change in status, stable for surgery.  I have reviewed the patient's chart and labs.  Questions were answered to the patient's satisfaction.     Stark Klein

## 2022-03-26 NOTE — Anesthesia Procedure Notes (Signed)
Procedure Name: LMA Insertion Date/Time: 03/26/2022 9:18 AM  Performed by: Janene Harvey, CRNAPre-anesthesia Checklist: Patient identified, Emergency Drugs available, Suction available and Patient being monitored Patient Re-evaluated:Patient Re-evaluated prior to induction Oxygen Delivery Method: Circle system utilized Preoxygenation: Pre-oxygenation with 100% oxygen Induction Type: IV induction LMA: LMA inserted LMA Size: 4.0 Placement Confirmation: positive ETCO2 Tube secured with: Tape Dental Injury: Teeth and Oropharynx as per pre-operative assessment

## 2022-03-26 NOTE — Discharge Instructions (Signed)
[facility] Office Phone Number [phone]  BREAST BIOPSY/ PARTIAL MASTECTOMY: POST OP INSTRUCTIONS  Always review your discharge instruction sheet given to you by the facility where your surgery was performed.  IF YOU HAVE DISABILITY OR FAMILY LEAVE FORMS, YOU MUST BRING THEM TO THE OFFICE FOR PROCESSING.  DO NOT GIVE THEM TO YOUR DOCTOR.  Take 2 tylenol (acetominophen) three times a day for 3 days.  If you still have pain, add ibuprofen with food in between if able to take this (if you have kidney issues or stomach issues, do not take ibuprofen).  If both of those are not enough, add the narcotic pain pill.  If you find you are needing a lot of this overnight after surgery, call the next morning for a refill.    Prescriptions will not be filled after 5pm or on week-ends. Take your usually prescribed medications unless otherwise directed You should eat very light the first 24 hours after surgery, such as soup, crackers, pudding, etc.  Resume your normal diet the day after surgery. Most patients will experience some swelling and bruising in the breast.  Ice packs and a good support bra will help.  Swelling and bruising can take several days to resolve.  It is common to experience some constipation if taking pain medication after surgery.  Increasing fluid intake and taking a stool softener will usually help or prevent this problem from occurring.  A mild laxative (Milk of Magnesia or Miralax) should be taken according to package directions if there are no bowel movements after 48 hours. Unless discharge instructions indicate otherwise, you may remove your bandages 48 hours after surgery, and you may shower at that time.  You may have steri-strips (small skin tapes) in place directly over the incision.  These strips should be left on the skin at least for for 7-10 days.    ACTIVITIES:  You may resume regular daily activities (gradually increasing) beginning the next day.  Wearing a  good support bra or sports bra (or the breast binder) minimizes pain and swelling.  You may have sexual intercourse when it is comfortable. No heavy lifting for 1-2 weeks (not over around 10 pounds).  You may drive when you no longer are taking prescription pain medication, you can comfortably wear a seatbelt, and you can safely maneuver your car and apply brakes. RETURN TO WORK:  __________3-14 days depending on job. _______________ You should see your doctor in the office for a follow-up appointment approximately two weeks after your surgery.  Your doctor's nurse will typically make your follow-up appointment when she calls you with your pathology report.  Expect your pathology report 3-4 business days after your surgery.  You may call to check if you do not hear from us after three days.   WHEN TO CALL YOUR DOCTOR: Fever over 101.0 Nausea and/or vomiting. Extreme swelling or bruising. Continued bleeding from incision. Increased pain, redness, or drainage from the incision.  The clinic staff is available to answer your questions during regular business hours.  Please don't hesitate to call and ask to speak to one of the nurses for clinical concerns.  If you have a medical emergency, go to the nearest emergency room or call 911.  A surgeon from [facility] is always on call at the hospital.  For further questions, please visit [email or web address]   

## 2022-03-26 NOTE — Op Note (Signed)
Left Breast Radioactive seed localized lumpectomy  Indications: This patient presents with history of abnormal left mammogram and radial scar found on biopsy.  She presents for excision.    Pre-operative Diagnosis: abnormal left mammogram  Post-operative Diagnosis: Same  Surgeon: Stark Klein   Assistant:  Celene Squibb, RNFA  Anesthesia: General endotracheal anesthesia  ASA Class: 2  Procedure Details  The patient was seen in the Holding Room. The risks, benefits, complications, treatment options, and expected outcomes were discussed with the patient. The possibilities of bleeding, infection, the need for additional procedures, failure to diagnose a condition, and creating a complication requiring other procedures or operations were discussed with the patient. The patient concurred with the proposed plan, giving informed consent.  The site of surgery properly noted/marked. The patient was taken to Operating Room # 2, identified, and the procedure verified as Left breast seed localized lumpectomy.  The left breast and chest were prepped and draped in standard fashion. A lateral circumareolar incision was made near the previously placed radioactive seed.  Dissection was carried down around the point of maximum signal intensity. The cautery was used to perform the dissection.   The specimen was inked with the margin marker paint kit.    Specimen radiography confirmed inclusion of the mammographic lesion, the clip, and the seed.  The background signal in the breast was zero.  Hemostasis was achieved with cautery. Additional margins were taken at the medial and posterior borders of the cavity.  The cavity was marked with clips on each border other than the anterior border.  The wound was irrigated and closed with 3-0 vicryl interrupted deep dermal sutures and 4-0 monocryl running subcuticular suture.      Sterile dressings were applied. At the end of the operation, all sponge, instrument, and  needle counts were correct.   Findings: Seed, clip in specimen.  Posterior margin is pectoralis.    Estimated Blood Loss:  min         Specimens: left breast tissue with seed         Complications:  None; patient tolerated the procedure well.         Disposition: PACU - hemodynamically stable.         Condition: stable

## 2022-03-26 NOTE — Anesthesia Preprocedure Evaluation (Signed)
Anesthesia Evaluation  Patient identified by MRN, date of birth, ID band Patient awake    Reviewed: Allergy & Precautions, NPO status , Patient's Chart, lab work & pertinent test results  History of Anesthesia Complications (+) PONV and history of anesthetic complications  Airway Mallampati: III  TM Distance: >3 FB Neck ROM: Full    Dental  (+) Teeth Intact, Dental Advisory Given   Pulmonary neg shortness of breath, sleep apnea and Continuous Positive Airway Pressure Ventilation , neg COPD, neg recent URI, former smoker   breath sounds clear to auscultation       Cardiovascular hypertension, Pt. on medications + Peripheral Vascular Disease   Rhythm:Regular     Neuro/Psych  PSYCHIATRIC DISORDERS Anxiety Depression     Neuromuscular disease    GI/Hepatic ,GERD  Medicated and Controlled,,(+) Hepatitis -  Endo/Other  diabetes, Type 2Hypothyroidism    Renal/GU negative Renal ROS     Musculoskeletal   Abdominal   Peds  Hematology negative hematology ROS (+) Lab Results      Component                Value               Date                      WBC                      7.7                 03/20/2022                HGB                      12.9                03/20/2022                HCT                      37.3                03/20/2022                MCV                      87.4                03/20/2022                PLT                      280                 03/20/2022              Anesthesia Other Findings   Reproductive/Obstetrics                             Anesthesia Physical Anesthesia Plan  ASA: 2  Anesthesia Plan: General   Post-op Pain Management: Tylenol PO (pre-op)* and Toradol IV (intra-op)*   Induction: Intravenous  PONV Risk Score and Plan: 4 or greater and Ondansetron, Dexamethasone, Propofol infusion, TIVA and Midazolam  Airway Management Planned:  LMA  Additional Equipment: None  Intra-op Plan:   Post-operative Plan: Extubation in OR  Informed Consent: I have reviewed the patients History and Physical, chart, labs and discussed the procedure including the risks, benefits and alternatives for the proposed anesthesia with the patient or authorized representative who has indicated his/her understanding and acceptance.     Dental advisory given  Plan Discussed with: CRNA  Anesthesia Plan Comments:        Anesthesia Quick Evaluation

## 2022-03-27 ENCOUNTER — Encounter (HOSPITAL_COMMUNITY): Payer: Self-pay | Admitting: General Surgery

## 2022-03-27 LAB — SURGICAL PATHOLOGY

## 2022-03-29 NOTE — Anesthesia Postprocedure Evaluation (Signed)
Anesthesia Post Note  Patient: Christina Stafford  Procedure(s) Performed: LEFT BREAST LUMPECTOMY WITH RADIOACTIVE SEED LOCALIZATION (Left: Breast)     Patient location during evaluation: PACU Anesthesia Type: General Level of consciousness: awake and alert Pain management: pain level controlled Vital Signs Assessment: post-procedure vital signs reviewed and stable Respiratory status: spontaneous breathing, nonlabored ventilation, respiratory function stable and patient connected to nasal cannula oxygen Cardiovascular status: blood pressure returned to baseline and stable Postop Assessment: no apparent nausea or vomiting Anesthetic complications: no  No notable events documented.  Last Vitals:  Vitals:   03/26/22 1045 03/26/22 1100  BP: 121/82 120/80  Pulse: 76 67  Resp: 13 14  Temp:  36.7 C  SpO2: 95% 96%    Last Pain:  Vitals:   03/26/22 1100  TempSrc:   PainSc: 0-No pain                 Julion Gatt

## 2022-04-03 ENCOUNTER — Other Ambulatory Visit: Payer: PRIVATE HEALTH INSURANCE

## 2022-04-03 ENCOUNTER — Other Ambulatory Visit (INDEPENDENT_AMBULATORY_CARE_PROVIDER_SITE_OTHER): Payer: Commercial Managed Care - PPO

## 2022-04-03 DIAGNOSIS — I10 Essential (primary) hypertension: Secondary | ICD-10-CM | POA: Diagnosis not present

## 2022-04-03 DIAGNOSIS — E782 Mixed hyperlipidemia: Secondary | ICD-10-CM

## 2022-04-03 DIAGNOSIS — E039 Hypothyroidism, unspecified: Secondary | ICD-10-CM

## 2022-04-03 DIAGNOSIS — E1151 Type 2 diabetes mellitus with diabetic peripheral angiopathy without gangrene: Secondary | ICD-10-CM | POA: Diagnosis not present

## 2022-04-03 LAB — COMPREHENSIVE METABOLIC PANEL
ALT: 41 U/L — ABNORMAL HIGH (ref 0–35)
AST: 30 U/L (ref 0–37)
Albumin: 4.4 g/dL (ref 3.5–5.2)
Alkaline Phosphatase: 81 U/L (ref 39–117)
BUN: 17 mg/dL (ref 6–23)
CO2: 28 mEq/L (ref 19–32)
Calcium: 9.7 mg/dL (ref 8.4–10.5)
Chloride: 101 mEq/L (ref 96–112)
Creatinine, Ser: 0.71 mg/dL (ref 0.40–1.20)
GFR: 96.91 mL/min (ref >60.00)
Glucose, Bld: 200 mg/dL — ABNORMAL HIGH (ref 70–99)
Potassium: 4 mEq/L (ref 3.5–5.1)
Sodium: 138 mEq/L (ref 135–145)
Total Bilirubin: 0.3 mg/dL (ref 0.2–1.2)
Total Protein: 7.1 g/dL (ref 6.0–8.3)

## 2022-04-03 LAB — HEMOGLOBIN A1C: Hgb A1c MFr Bld: 8.1 % — ABNORMAL HIGH (ref 4.6–6.5)

## 2022-04-03 LAB — LIPID PANEL
Cholesterol: 186 mg/dL (ref 0–200)
HDL: 41.4 mg/dL (ref >39.00)
NonHDL: 144.73
Total CHOL/HDL Ratio: 4
Triglycerides: 253 mg/dL — ABNORMAL HIGH (ref 0.0–149.0)
VLDL: 50.6 mg/dL — ABNORMAL HIGH (ref 0.0–40.0)

## 2022-04-03 LAB — LDL CHOLESTEROL, DIRECT: Direct LDL: 106 mg/dL

## 2022-04-03 LAB — TSH: TSH: 4.12 u[IU]/mL (ref 0.35–5.50)

## 2022-04-08 ENCOUNTER — Encounter: Payer: Self-pay | Admitting: Internal Medicine

## 2022-04-08 ENCOUNTER — Ambulatory Visit: Payer: Commercial Managed Care - PPO | Admitting: Internal Medicine

## 2022-04-08 VITALS — BP 136/88 | HR 76 | Temp 97.7°F | Ht 63.0 in | Wt 213.8 lb

## 2022-04-08 DIAGNOSIS — Z23 Encounter for immunization: Secondary | ICD-10-CM | POA: Diagnosis not present

## 2022-04-08 DIAGNOSIS — I10 Essential (primary) hypertension: Secondary | ICD-10-CM | POA: Diagnosis not present

## 2022-04-08 DIAGNOSIS — R030 Elevated blood-pressure reading, without diagnosis of hypertension: Secondary | ICD-10-CM

## 2022-04-08 DIAGNOSIS — E1159 Type 2 diabetes mellitus with other circulatory complications: Secondary | ICD-10-CM

## 2022-04-08 DIAGNOSIS — E669 Obesity, unspecified: Secondary | ICD-10-CM

## 2022-04-08 DIAGNOSIS — E119 Type 2 diabetes mellitus without complications: Secondary | ICD-10-CM

## 2022-04-08 DIAGNOSIS — E1169 Type 2 diabetes mellitus with other specified complication: Secondary | ICD-10-CM | POA: Diagnosis not present

## 2022-04-08 LAB — MICROALBUMIN / CREATININE URINE RATIO
Creatinine,U: 166.3 mg/dL
Microalb Creat Ratio: 1.8 mg/g (ref 0.0–30.0)
Microalb, Ur: 2.9 mg/dL — ABNORMAL HIGH (ref 0.0–1.9)

## 2022-04-08 MED ORDER — METFORMIN HCL 500 MG PO TABS: 1000.0000 mg | ORAL_TABLET | Freq: Two times a day (BID) | ORAL | 1 refills | Status: DC

## 2022-04-08 MED ORDER — SEMAGLUTIDE(0.25 OR 0.5MG/DOS) 2 MG/3ML ~~LOC~~ SOPN: 0.5000 mg | PEN_INJECTOR | SUBCUTANEOUS | 1 refills | Status: DC

## 2022-04-08 NOTE — Assessment & Plan Note (Signed)
I have addressed  BMI and her motivation for weight loss,  her cravings,  and her attitude toward food.  Counselling given

## 2022-04-08 NOTE — Patient Instructions (Addendum)
Increase evening dose of metformin to 1000 mg ,  change to suppertime ,  foolowed by increase in morning  dose,  if tolerated to 1000 mg .   Morning sugar goal is 130 or less ,  2 hr post prandial  goal 160 or less  We can start glipizide IN A MONTH if not at goal.   Send me 5 BP readings from different days

## 2022-04-08 NOTE — Assessment & Plan Note (Addendum)
Loss of control due to nighttime eating,  not exercising and discontinuation of GLP 1 agonist due to insurance nonpayment . Recent fasting have been 150 or higher  and weight gain of 11 lbs since stopping medication noted.  Today we Increased metformin to 1000 mg bid and ordered Ozempic.  PA needed per patient's last communication with insuranc

## 2022-04-08 NOTE — Addendum Note (Signed)
Addended by: Crecencio Mc on: 04/08/2022 01:01 PM   Modules accepted: Orders

## 2022-04-08 NOTE — Assessment & Plan Note (Signed)
she reports compliance with medication regimen  but has an elevated reading today in office.  She is not using NSAIDs daily.  Discussed goal of 120/70  (130/80 for patients over 70)  to preserve renal function.  She has been asked to check her  BP  at home and  submit readings for evaluation. Renal function, electrolytes , normal  will resreen for proteinuria are all normal

## 2022-04-08 NOTE — Progress Notes (Signed)
Subjective:  Patient ID: Christina Stafford, female    DOB: 08-26-1970  Age: 52 y.o. MRN: [id number]  CC: The primary encounter diagnosis was Elevated blood pressure reading in office without diagnosis of hypertension. Diagnoses of Obesity, diabetes, and hypertension syndrome ([place]), Primary hypertension, Morbid obesity ([place]), and Need for shingles vaccine were also pertinent to this visit.   HPI Christina Stafford presents for  Chief Complaint  Patient presents with   Medical Management of Chronic Issues   1) type 2 DM: loss of control due to nighttime snacking and lack of exercise.   Insurance stopped [place],  has not taken it in months.  Will cover ozempic with PA .  Taking 500 mg metformin bid     Outpatient Medications Prior to Visit  Medication Sig Dispense Refill   APPLE CIDER VINEGAR PO Take 1 tablet by mouth daily.     Ascorbic Acid (VITAMIN C PO) Take 1 tablet by mouth daily.     Berberine Chloride (BERBERINE HCI PO) Take 1 capsule by mouth daily.     blood glucose meter kit and supplies Dispense based on patient and insurance preference. Use to check blood sugars twice daily. 1 each 0   Cyanocobalamin (B-12 PO) Take 1 tablet by mouth daily.     esomeprazole (NEXIUM) 20 MG capsule Take 20 mg by mouth daily.     fexofenadine (ALLEGRA) 180 MG tablet Take 180 mg by mouth daily.     glucose blood test strip Use to check blood sugars twice daily. 100 each 5   hydrochlorothiazide (MICROZIDE) 12.5 MG capsule TAKE 1 CAPSULE (12.5 MG) BY MOUTH EVERY DAY 90 capsule 1   levothyroxine (SYNTHROID) 88 MCG tablet TAKE 1 TABLET EVERY DAY ON EMPTY STOMACHWITH A GLASS OF WATER AT LEAST 30-60 MINBEFORE BREAKFAST 90 tablet 1   losartan-hydrochlorothiazide (HYZAAR) 50-12.5 MG tablet TAKE 1 TABLET BY MOUTH DAILY 90 tablet 1   metFORMIN (GLUCOPHAGE) 500 MG tablet TAKE ONE TABLET TWICE A DAY WITH MEALS 180 tablet 1   metoprolol succinate (TOPROL-XL) 25 MG 24 hr tablet TAKE ONE TABLET (25  MG) BY MOUTH EVERY DAY 90 tablet 1   Omega-3 Fatty Acids (OMEGA 3 PO) Take 1 capsule by mouth daily.     OVER THE COUNTER MEDICATION Take 1 tablet by mouth at bedtime. 0.3% THC Gummy     Potassium 99 MG TABS Take 99 mg by mouth daily.     rosuvastatin (CRESTOR) 20 MG tablet TAKE 1 TABLET BY MOUTH DAILY. 90 tablet 1   sertraline (ZOLOFT) 50 MG tablet TAKE ONE AND A HALF TABLETS (75 MG) BY MOUTH EVERY DAY 45 tablet 1   ALPRAZolam (XANAX) 0.5 MG tablet Take 1 tablet (0.5 mg total) by mouth daily as needed for anxiety (or panic attacks). (Patient not taking: Reported on 03/14/2022) 30 tablet 0   amoxicillin-clavulanate (AUGMENTIN) 875-125 MG tablet Take 1 tablet by mouth 2 (two) times daily. (Patient not taking: Reported on 03/14/2022) 14 tablet 0   oxyCODONE (OXY IR/ROXICODONE) 5 MG immediate release tablet Take 1 tablet (5 mg total) by mouth every 6 (six) hours as needed for severe pain. 10 tablet 0   predniSONE (DELTASONE) 10 MG tablet 6 tablets on Day 1 , then reduce by 1 tablet daily until gone (Patient not taking: Reported on 03/14/2022) 21 tablet 0   triamcinolone cream (KENALOG) 0.1 % Apply 1 application topically 2 (two) times daily. (Patient not taking: Reported on 03/14/2022) 30 g 0   vitamin  E 180 MG (400 UNITS) capsule Take 400 Units by mouth daily.     tirzepatide [facility]) 2.5 MG/0.5ML Pen Inject 2.5 mg into the skin once a week. (Patient not taking: Reported on 03/14/2022) 2 mL 1   No facility-administered medications prior to visit.    Review of Systems;  Patient denies headache, fevers, malaise, unintentional weight loss, skin rash, eye pain, sinus congestion and sinus pain, sore throat, dysphagia,  hemoptysis , cough, dyspnea, wheezing, chest pain, palpitations, orthopnea, edema, abdominal pain, nausea, melena, diarrhea, constipation, flank pain, dysuria, hematuria, urinary  Frequency, nocturia, numbness, tingling, seizures,  Focal weakness, Loss of consciousness,  Tremor,  insomnia, depression, anxiety, and suicidal ideation.      Objective:  BP 136/88   Pulse 76   Temp 97.7 F (36.5 C) (Oral)   Ht '5\' 3"'$  (1.6 m)   Wt 213 lb 12.8 oz (97 kg)   LMP 01/20/2017 (Approximate)   SpO2 97%   BMI 37.87 kg/m   BP Readings from Last 3 Encounters:  04/08/22 136/88  03/26/22 120/80  03/20/22 (!) 131/90    Wt Readings from Last 3 Encounters:  04/08/22 213 lb 12.8 oz (97 kg)  03/26/22 216 lb (98 kg)  03/20/22 216 lb 12.8 oz (98.3 kg)    Physical Exam Vitals reviewed.  Constitutional:      General: She is not in acute distress.    Appearance: Normal appearance. She is normal weight. She is not ill-appearing, toxic-appearing or diaphoretic.  HENT:     Head: Normocephalic.  Eyes:     General: No scleral icterus.       Right eye: No discharge.        Left eye: No discharge.     Conjunctiva/sclera: Conjunctivae normal.  Cardiovascular:     Rate and Rhythm: Normal rate and regular rhythm.     Heart sounds: Normal heart sounds.  Pulmonary:     Effort: Pulmonary effort is normal. No respiratory distress.     Breath sounds: Normal breath sounds.  Musculoskeletal:        General: Normal range of motion.  Skin:    General: Skin is warm and dry.  Neurological:     General: No focal deficit present.     Mental Status: She is alert and oriented to person, place, and time. Mental status is at baseline.  Psychiatric:        Mood and Affect: Mood normal.        Behavior: Behavior normal.        Thought Content: Thought content normal.        Judgment: Judgment normal.    Lab Results  Component Value Date   HGBA1C 8.1 (H) 04/03/2022   HGBA1C 7.5 (H) 03/20/2022   HGBA1C 6.5 10/04/2021    Lab Results  Component Value Date   CREATININE 0.71 04/03/2022   CREATININE 0.82 03/20/2022   CREATININE 0.73 10/04/2021    Lab Results  Component Value Date   WBC 7.7 03/20/2022   HGB 12.9 03/20/2022   HCT 37.3 03/20/2022   PLT 280 03/20/2022   GLUCOSE 200  (H) 04/03/2022   CHOL 186 04/03/2022   TRIG 253.0 (H) 04/03/2022   HDL 41.40 04/03/2022   LDLDIRECT 106.0 04/03/2022   LDLCALC 100 (H) 10/04/2021   ALT 41 (H) 04/03/2022   AST 30 04/03/2022   NA 138 04/03/2022   K 4.0 04/03/2022   CL 101 04/03/2022   CREATININE 0.71 04/03/2022   BUN 17 04/03/2022  CO2 28 04/03/2022   TSH 4.12 04/03/2022   INR 0.9 02/07/2020   HGBA1C 8.1 (H) 04/03/2022   MICROALBUR <0.7 10/04/2021    MM Breast Surgical Specimen  Result Date: 03/26/2022 CLINICAL DATA:  Post lumpectomy specimen radiograph EXAM: SPECIMEN RADIOGRAPH OF THE LEFT BREAST COMPARISON:  Previous exam(s). FINDINGS: Status post excision of the left breast. The radioactive seed and biopsy marker clip are present, completely intact, and were marked for pathology. IMPRESSION: Specimen radiograph of the left breast. Electronically Signed   By: Audie Pinto M.D.   On: 03/26/2022 10:12   Assessment & Plan:  .Elevated blood pressure reading in office without diagnosis of hypertension  Obesity, diabetes, and hypertension syndrome ([place]) Assessment & Plan: Loss of control due to nighttime eating,  not exercising and discontinuation of GLP 1 agonist due to insurance nonpayment . Recent fasting have been 150 or higher  and weight gain of 11 lbs since stopping medication noted.  Today we Increased metformin to 1000 mg bid and ordered Ozempic.  PA needed per patient's last communication with insuranc  Orders: -     Semaglutide(0.25 or 0.'5MG'$ /DOS); Inject 0.5 mg into the skin once a week.  Dispense: 3 mL; Refill: 1  Primary hypertension Assessment & Plan: she reports compliance with medication regimen  but has an elevated reading today in office.  She is not using NSAIDs daily.  Discussed goal of 120/70  (130/80 for patients over 70)  to preserve renal function.  She has been asked to check her  BP  at home and  submit readings for evaluation. Renal function, electrolytes , normal  will resreen for  proteinuria are all normal   Orders: -     Microalbumin / creatinine urine ratio  Morbid obesity ([place]) Assessment & Plan: I have addressed  BMI and her motivation for weight loss,  her cravings,  and her attitude toward food.  Counselling given    Need for shingles vaccine -     Varicella-zoster vaccine IM     I provided 30 minutes of face-to-face time during this encounter reviewing patient's last visit with me, patient's  most surgical and non surgical procedures, previous  labs and imaging studies, counseling on currently addressed issues,  and post visit ordering to diagnostics and therapeutics .   Follow-up: No follow-ups on file.   Crecencio Mc, MD

## 2022-04-17 ENCOUNTER — Other Ambulatory Visit: Payer: Self-pay | Admitting: Internal Medicine

## 2022-04-22 ENCOUNTER — Telehealth: Payer: Self-pay

## 2022-04-22 ENCOUNTER — Other Ambulatory Visit (HOSPITAL_COMMUNITY): Payer: Self-pay

## 2022-04-22 NOTE — Telephone Encounter (Signed)
noted 

## 2022-04-22 NOTE — Telephone Encounter (Signed)
Pharmacy Patient Advocate Encounter   Received notification from [facility] that prior authorization for Ozempic '2mg'$ /89m is required/requested.    PA submitted on 04/22/22 to (ins) VLucilla Edinvia CoverMyMeds Key [id number] is pending

## 2022-05-02 ENCOUNTER — Other Ambulatory Visit: Payer: Self-pay | Admitting: Internal Medicine

## 2022-05-02 ENCOUNTER — Other Ambulatory Visit (HOSPITAL_COMMUNITY): Payer: Self-pay

## 2022-05-02 LAB — HM DIABETES EYE EXAM

## 2022-05-02 MED ORDER — TIRZEPATIDE 2.5 MG/0.5ML ~~LOC~~ SOAJ: 2.5000 mg | SUBCUTANEOUS | 1 refills | Status: DC

## 2022-05-02 NOTE — Telephone Encounter (Signed)
PA is needed

## 2022-05-02 NOTE — Telephone Encounter (Signed)
Pt's insurance will not cover Ozempic.

## 2022-05-02 NOTE — Telephone Encounter (Signed)
Received a fax regarding Prior Authorization from [place] for [place] 82m/3ml.  Authorization has been DENIED because the drug is a plan exclusion.  Phone# [phone] Denial letter attached to chart.

## 2022-05-03 ENCOUNTER — Other Ambulatory Visit (HOSPITAL_COMMUNITY): Payer: Self-pay

## 2022-05-03 NOTE — Telephone Encounter (Signed)
Pharmacy Patient Advocate Encounter   Received notification that prior authorization for [facility] 2.5MG/0.5ML pen-injectors is required/requested.   PA submitted on 05/03/22 to (ins) Lucilla Edin via CoverMyMeds Key [id number] Status is pending

## 2022-05-06 ENCOUNTER — Telehealth: Payer: Self-pay | Admitting: Internal Medicine

## 2022-05-06 NOTE — Telephone Encounter (Signed)
Spoke with pt and scheduled for 3/28/224 at 4:15.

## 2022-05-06 NOTE — Telephone Encounter (Signed)
Pt called wanting to schedule her next shingle shot and I did not know how many months apart to schedule

## 2022-05-07 ENCOUNTER — Emergency Department: Payer: Commercial Managed Care - PPO

## 2022-05-07 ENCOUNTER — Other Ambulatory Visit: Payer: Self-pay

## 2022-05-07 ENCOUNTER — Ambulatory Visit: Payer: Commercial Managed Care - PPO

## 2022-05-07 ENCOUNTER — Encounter: Payer: Self-pay | Admitting: Emergency Medicine

## 2022-05-07 ENCOUNTER — Emergency Department
Admission: EM | Admit: 2022-05-07 | Discharge: 2022-05-07 | Disposition: A | Discharge status: Home / Self Care | Payer: Commercial Managed Care - PPO | Attending: Emergency Medicine | Admitting: Emergency Medicine

## 2022-05-07 ENCOUNTER — Encounter (HOSPITAL_COMMUNITY): Payer: Self-pay

## 2022-05-07 DIAGNOSIS — E119 Type 2 diabetes mellitus without complications: Secondary | ICD-10-CM

## 2022-05-07 DIAGNOSIS — K5732 Diverticulitis of large intestine without perforation or abscess without bleeding: Secondary | ICD-10-CM | POA: Insufficient documentation

## 2022-05-07 DIAGNOSIS — I10 Essential (primary) hypertension: Secondary | ICD-10-CM | POA: Insufficient documentation

## 2022-05-07 DIAGNOSIS — R1012 Left upper quadrant pain: Secondary | ICD-10-CM | POA: Diagnosis present

## 2022-05-07 LAB — URINALYSIS, ROUTINE W REFLEX MICROSCOPIC
Bilirubin Urine: NEGATIVE
Glucose, UA: NEGATIVE mg/dL
Hgb urine dipstick: NEGATIVE
Ketones, ur: NEGATIVE mg/dL
Leukocytes,Ua: NEGATIVE
Nitrite: NEGATIVE
Protein, ur: NEGATIVE mg/dL
Specific Gravity, Urine: 1.012 (ref 1.005–1.030)
pH: 6 (ref 5.0–8.0)

## 2022-05-07 LAB — COMPREHENSIVE METABOLIC PANEL WITH GFR
ALT: 40 U/L (ref 0–44)
AST: 44 U/L — ABNORMAL HIGH (ref 15–41)
Albumin: 4.1 g/dL (ref 3.5–5.0)
Alkaline Phosphatase: 87 U/L (ref 38–126)
Anion gap: 12 (ref 5–15)
BUN: 11 mg/dL (ref 6–20)
CO2: 25 mmol/L (ref 22–32)
Calcium: 9.5 mg/dL (ref 8.9–10.3)
Chloride: 98 mmol/L (ref 98–111)
Creatinine, Ser: 0.81 mg/dL (ref 0.44–1.00)
GFR, Estimated: >60 mL/min
Glucose, Bld: 155 mg/dL — ABNORMAL HIGH (ref 70–99)
Potassium: 3 mmol/L — ABNORMAL LOW (ref 3.5–5.1)
Sodium: 135 mmol/L (ref 135–145)
Total Bilirubin: 0.8 mg/dL (ref 0.3–1.2)
Total Protein: 7.5 g/dL (ref 6.5–8.1)

## 2022-05-07 LAB — CBC
HCT: 38.7 % (ref 36.0–46.0)
Hemoglobin: 13.3 g/dL (ref 12.0–15.0)
MCH: 29.6 pg (ref 26.0–34.0)
MCHC: 34.4 g/dL (ref 30.0–36.0)
MCV: 86.2 fL (ref 80.0–100.0)
Platelets: 318 K/uL (ref 150–400)
RBC: 4.49 MIL/uL (ref 3.87–5.11)
RDW: 13.3 % (ref 11.5–15.5)
WBC: 8 K/uL (ref 4.0–10.5)
nRBC: 0 % (ref 0.0–0.2)

## 2022-05-07 LAB — LIPASE, BLOOD: Lipase: 34 U/L (ref 11–51)

## 2022-05-07 MED ORDER — PANTOPRAZOLE SODIUM 40 MG IV SOLR
40.0000 mg | Freq: Once | INTRAVENOUS | Status: AC
  Administered 2022-05-07: 40 mg via INTRAVENOUS
  Filled 2022-05-07: qty 10

## 2022-05-07 MED ORDER — ONDANSETRON HCL 4 MG/2ML IJ SOLN
4.0000 mg | Freq: Once | INTRAMUSCULAR | Status: AC
  Administered 2022-05-07: 4 mg via INTRAVENOUS
  Filled 2022-05-07: qty 2

## 2022-05-07 MED ORDER — KETOROLAC TROMETHAMINE 15 MG/ML IJ SOLN
15.0000 mg | Freq: Once | INTRAMUSCULAR | Status: AC
  Administered 2022-05-07: 15 mg via INTRAVENOUS
  Filled 2022-05-07: qty 1

## 2022-05-07 MED ORDER — IOHEXOL 300 MG/ML  SOLN
100.0000 mL | Freq: Once | INTRAMUSCULAR | Status: AC | PRN
  Administered 2022-05-07: 100 mL via INTRAVENOUS

## 2022-05-07 MED ORDER — SODIUM CHLORIDE 0.9 % IV BOLUS
1000.0000 mL | Freq: Once | INTRAVENOUS | Status: AC
  Administered 2022-05-07: 1000 mL via INTRAVENOUS

## 2022-05-07 MED ORDER — NAPROXEN 500 MG PO TABS: 500.0000 mg | ORAL_TABLET | Freq: Two times a day (BID) | ORAL | 0 refills | Status: DC

## 2022-05-07 MED ORDER — ONDANSETRON 4 MG PO TBDP: 4.0000 mg | ORAL_TABLET | Freq: Three times a day (TID) | ORAL | 0 refills | Status: DC | PRN

## 2022-05-07 MED ORDER — AMOXICILLIN-POT CLAVULANATE 875-125 MG PO TABS: 1.0000 | ORAL_TABLET | Freq: Two times a day (BID) | ORAL | 0 refills | Status: AC

## 2022-05-07 NOTE — ED Provider Notes (Signed)
[facility] Provider Note    Event Date/Time   First MD Initiated Contact with Patient 05/07/22 1637     (approximate)   History   Chief Complaint: left side pain   HPI  Christina Stafford is a 52 y.o. female with a history of hypertension diabetes GERD NASH who comes the ED complaining of left upper quadrant abdominal pain for the past 4 days, waxing and waning, generally worsening.  Worse with movement and jostling.  No alleviating factors.  Nonradiating.  Denies nausea or vomiting or diarrhea but does feel like she has had decreased bowel movements over this time.  Symptoms started after situational increased alcohol intake.      Physical Exam   Triage Vital Signs: ED Triage Vitals  Enc Vitals Group     BP 05/07/22 1337 (!) 137/98     Pulse Rate 05/07/22 1337 86     Resp 05/07/22 1337 18     Temp 05/07/22 1337 99 F (37.2 C)     Temp src --      SpO2 05/07/22 1337 98 %     Weight 05/07/22 1554 213 lb 13.5 oz (97 kg)     Height 05/07/22 1554 5' 3"$  (1.6 m)     Head Circumference --      Peak Flow --      Pain Score 05/07/22 1336 4     Pain Loc --      Pain Edu? --      Excl. in [place]? --     Most recent vital signs: Vitals:   05/07/22 1337 05/07/22 1753  BP: (!) 137/98 130/88  Pulse: 86 90  Resp: 18 18  Temp: 99 F (37.2 C) 98.9 F (37.2 C)  SpO2: 98% 99%    General: Awake, no distress.  CV:  Good peripheral perfusion.  Regular rate rhythm, normal distal pulses Resp:  Normal effort.  Abd:  No distention.  Soft with diffuse left-sided abdominal tenderness, worse in left upper quadrant.  Tenderness to percussion as well without rigidity or guarding. Other:  Somewhat dry mucous membranes.  No lower extremity edema   ED Results / Procedures / Treatments   Labs (all labs ordered are listed, but only abnormal results are displayed) Labs Reviewed  COMPREHENSIVE METABOLIC PANEL - Abnormal; Notable for the following components:       Result Value   Potassium 3.0 (*)    Glucose, Bld 155 (*)    AST 44 (*)    All other components within normal limits  URINALYSIS, ROUTINE W REFLEX MICROSCOPIC - Abnormal; Notable for the following components:   Color, Urine YELLOW (*)    APPearance CLEAR (*)    All other components within normal limits  LIPASE, BLOOD  CBC     EKG    RADIOLOGY CT abdomen pelvis interpreted by me, negative for free air or bowel obstruction.  Radiology report reviewed noting mild uncomplicated diverticulitis of the descending colon   PROCEDURES:  Procedures   MEDICATIONS ORDERED IN ED: Medications  sodium chloride 0.9 % bolus 1,000 mL (1,000 mLs Intravenous New Bag/Given 05/07/22 1747)  ketorolac (TORADOL) 15 MG/ML injection 15 mg (15 mg Intravenous Given 05/07/22 1748)  ondansetron (ZOFRAN) injection 4 mg (4 mg Intravenous Given 05/07/22 1748)  pantoprazole (PROTONIX) injection 40 mg (40 mg Intravenous Given 05/07/22 1747)  iohexol (OMNIPAQUE) 300 MG/ML solution 100 mL (100 mLs Intravenous Contrast Given 05/07/22 1731)     IMPRESSION / MDM / ASSESSMENT  AND PLAN / ED COURSE  I reviewed the triage vital signs and the nursing notes.  DDx: Gastritis, pancreatitis, diverticulitis, intra-abdominal abscess, bowel perforation, gas bloating, SBO, constipation  Patient's presentation is most consistent with acute presentation with potential threat to life or bodily function.  Patient presents with abdominal pain and pronounced tenderness on exam.  Differential is broad.  Vital signs are unremarkable, not septic, nontoxic.  Will give IV fluids Toradol Zofran and Protonix for hydration and symptom relief while obtaining CT scan of the abdomen and pelvis.   ----------------------------------------- 6:53 PM on 05/07/2022 ----------------------------------------- CT shows mild diverticulitis without complication.  Patient is feeling better after IV fluids and medications.  Comfortable with outpatient  management.  Will start on Augmentin.      FINAL CLINICAL IMPRESSION(S) / ED DIAGNOSES   Final diagnoses:  Left upper quadrant abdominal pain  Type 2 diabetes mellitus without complication, without long-term current use of insulin (HCC)  Diverticulitis large intestine w/o perforation or abscess w/o bleeding     Rx / DC Orders   ED Discharge Orders          Ordered    amoxicillin-clavulanate (AUGMENTIN) 875-125 MG tablet  2 times daily        05/07/22 1853    ondansetron (ZOFRAN-ODT) 4 MG disintegrating tablet  Every 8 hours PRN        05/07/22 1853    naproxen (NAPROSYN) 500 MG tablet  2 times daily with meals        05/07/22 1853             Note:  This document was prepared using Dragon voice recognition software and may include unintentional dictation errors.   Carrie Mew, MD 05/07/22 [phone]

## 2022-05-07 NOTE — ED Triage Notes (Addendum)
Pt comes with c/o left side pain since Friday. Pt states it came and went. Pt states it is getting uncomfortable. Pt states upset stomach. Pt states  left upper pain which is tender to touch.   Pt states she did drink a little more than normal this past weekend. Pt states occasional drinking.

## 2022-05-08 ENCOUNTER — Telehealth: Payer: Self-pay

## 2022-05-08 NOTE — Transitions of Care (Post Inpatient/ED Visit) (Signed)
   05/08/2022  Name: Christina Stafford MRN: [id number] DOB: 1970-11-04  Today's TOC FU Call Status: Today's TOC FU Call Status:: Successful TOC FU Call Competed TOC FU Call Complete Date: 05/08/22  Transition Care Management Follow-up Telephone Call Date of Discharge: 05/07/22 Discharge Facility: [facility]) Type of Discharge: Emergency Department Reason for ED Visit: Other: (Diverticulitis) How have you been since you were released from the hospital?: Better Any questions or concerns?: Yes Patient Questions/Concerns:: Pt asked if she should continue with a liquid diet. Patient Questions/Concerns Addressed: Other: (In formed pt to advance diet as she tolerates.  Today she is feeling better but still having some abdominal discomfort, stay with liquids today.)  Items Reviewed: Did you receive and understand the discharge instructions provided?: Yes Medications obtained and verified?: Yes (Medications Reviewed) Any new allergies since your discharge?: No Dietary orders reviewed?: NA Do you have support at home?: Yes People in Home: spouse Name of Support/Comfort Primary Source: [facility] [place] and Equipment/Supplies: [place] Ordered?: No Any new equipment or medical supplies ordered?: No  Functional Questionnaire: Do you need assistance with bathing/showering or dressing?: No Do you need assistance with meal preparation?: No Do you need assistance with eating?: No Do you have difficulty maintaining continence: No Do you need assistance with getting out of bed/getting out of a chair/moving?: No Do you have difficulty managing or taking your medications?: No  Folllow up appointments reviewed: PCP Follow-up appointment confirmed?: NA (Pt declined scheduling an ED follow up at this time.  She would like to give antibiotics time to help. Will call to schedule follow up if needed.) [facility] Follow-up appointment  confirmed?: NA Do you need transportation to your follow-up appointment?: No Do you understand care options if your condition(s) worsen?: Yes-patient verbalized understanding    SIGNATURE Ferne Reus, RN

## 2022-05-10 ENCOUNTER — Encounter: Payer: Self-pay | Admitting: Internal Medicine

## 2022-05-10 DIAGNOSIS — G8929 Other chronic pain: Secondary | ICD-10-CM

## 2022-05-10 DIAGNOSIS — K5792 Diverticulitis of intestine, part unspecified, without perforation or abscess without bleeding: Secondary | ICD-10-CM

## 2022-05-10 DIAGNOSIS — I700 Atherosclerosis of aorta: Secondary | ICD-10-CM

## 2022-05-11 ENCOUNTER — Other Ambulatory Visit: Payer: Self-pay | Admitting: Internal Medicine

## 2022-05-11 DIAGNOSIS — K5792 Diverticulitis of intestine, part unspecified, without perforation or abscess without bleeding: Secondary | ICD-10-CM | POA: Insufficient documentation

## 2022-05-11 DIAGNOSIS — I152 Hypertension secondary to endocrine disorders: Secondary | ICD-10-CM

## 2022-05-11 DIAGNOSIS — I700 Atherosclerosis of aorta: Secondary | ICD-10-CM | POA: Insufficient documentation

## 2022-05-11 MED ORDER — SAXENDA 18 MG/3ML ~~LOC~~ SOPN: 0.6000 mg | PEN_INJECTOR | Freq: Every day | SUBCUTANEOUS | 0 refills | Status: DC

## 2022-05-11 MED ORDER — PEN NEEDLES 31G X 6 MM MISC: 0 refills | Status: DC

## 2022-05-11 MED ORDER — LOSARTAN POTASSIUM-HCTZ 100-25 MG PO TABS: 1.0000 | ORAL_TABLET | Freq: Every day | ORAL | 0 refills | Status: DC

## 2022-05-11 NOTE — Assessment & Plan Note (Addendum)
First episode , Diagnosed by [facility] ordered by  ER.  Taking augmentin, bland diet.

## 2022-05-11 NOTE — Assessment & Plan Note (Addendum)
She has been unable to obtain Mounjaro or Ozempic.  Prescribing Saxenda.   Home BPs have been above goal:  increase losartan to 100.  Losartan/hct  100/25 sent to [facility]  pharmacy

## 2022-05-24 ENCOUNTER — Other Ambulatory Visit (HOSPITAL_COMMUNITY): Payer: Self-pay

## 2022-05-27 NOTE — Telephone Encounter (Signed)
Pharmacy Patient Advocate Encounter  Received notification from Christina Stafford that the request for prior authorization for [facility] 2.'5MG'$ /0.5ML pen-injectors has been denied due to Plan Exclusion.    Please be advised we currently do not have a Pharmacist to review denials, therefore you will need to process appeals accordingly as needed. Thanks for your support at this time.   You may obtain an appeal form at [email or web address] and send appeal to:

## 2022-06-13 ENCOUNTER — Ambulatory Visit (INDEPENDENT_AMBULATORY_CARE_PROVIDER_SITE_OTHER): Payer: Commercial Managed Care - PPO

## 2022-06-13 DIAGNOSIS — Z23 Encounter for immunization: Secondary | ICD-10-CM | POA: Diagnosis not present

## 2022-06-13 NOTE — Progress Notes (Signed)
Patient presented for 2ND SHINGRIX injection to left deltoid, patient voiced no concerns nor showed any signs of distress during injection

## 2022-06-25 ENCOUNTER — Other Ambulatory Visit: Payer: Self-pay | Admitting: Internal Medicine

## 2022-08-14 ENCOUNTER — Other Ambulatory Visit: Payer: Self-pay | Admitting: Internal Medicine

## 2022-08-28 ENCOUNTER — Other Ambulatory Visit: Payer: Self-pay | Admitting: Internal Medicine

## 2022-09-27 ENCOUNTER — Other Ambulatory Visit: Payer: Self-pay | Admitting: Internal Medicine

## 2022-10-03 ENCOUNTER — Other Ambulatory Visit: Payer: Self-pay | Admitting: Internal Medicine

## 2022-10-03 DIAGNOSIS — E119 Type 2 diabetes mellitus without complications: Secondary | ICD-10-CM

## 2022-10-31 ENCOUNTER — Other Ambulatory Visit: Payer: Self-pay | Admitting: General Surgery

## 2022-10-31 DIAGNOSIS — R92343 Mammographic extreme density, bilateral breasts: Secondary | ICD-10-CM

## 2022-11-04 ENCOUNTER — Telehealth: Payer: Commercial Managed Care - PPO | Admitting: Physician Assistant

## 2022-11-04 ENCOUNTER — Encounter: Payer: Self-pay | Admitting: Internal Medicine

## 2022-11-04 DIAGNOSIS — U071 COVID-19: Secondary | ICD-10-CM

## 2022-11-04 MED ORDER — ALBUTEROL SULFATE HFA 108 (90 BASE) MCG/ACT IN AERS
1.0000 | INHALATION_SPRAY | Freq: Four times a day (QID) | RESPIRATORY_TRACT | 0 refills | Status: DC | PRN
Start: 2022-11-04 — End: 2022-12-23

## 2022-11-04 MED ORDER — FLUTICASONE PROPIONATE 50 MCG/ACT NA SUSP
2.0000 | Freq: Every day | NASAL | 0 refills | Status: DC
Start: 2022-11-04 — End: 2022-12-23

## 2022-11-04 NOTE — Progress Notes (Signed)
 E-Visit  for Positive Covid Test Result   We are sorry you are not feeling well. We are here to help!  You have tested positive for COVID-19, meaning that you were infected with the novel coronavirus and could give the virus to others.  Most people with COVID-19 have mild illness and can recover at home without medical care. Do not leave your home, except to get medical care. Do not visit public areas and do not go to places where you are unable to wear a mask. It is important that you stay home  to take care for yourself and to help protect other people in your home and community.      Isolation Instructions:   You are to isolate at home until you have been fever free for at least 24 hours without a fever-reducing medication, and symptoms have been steadily improving for 24 hours. At that time,  you can end isolation but need to mask for an additional 5 days.  If you must be around other household members who do not have symptoms, you need to make sure that both you and the family members are masking consistently with a high-quality mask.  If you note any worsening of symptoms despite treatment, please seek an in-person evaluation ASAP. If you note any significant shortness of breath or any chest pain, please seek ER evaluation. Please do not delay care!   Go to the nearest hospital ED for assessment if fever/cough/breathlessness are severe or illness seems like a threat to life.    The following symptoms may appear 2-14 days after exposure: Fever Cough Shortness of breath or difficulty breathing Chills Repeated shaking with chills Muscle pain Headache Sore throat New loss of taste or smell Fatigue Congestion or runny nose Nausea or vomiting Diarrhea  You can use medication such as  prescription inhaler called Albuterol MDI 90 mcg /actuation 2 puffs every 4 hours as needed for shortness of breath, wheezing, cough and prescription for Fluticasone nasal spray 2 sprays in each  nostril one time per day  You may also take acetaminophen (Tylenol) as needed for fever.  HOME CARE: Only take medications as instructed by your medical team. Drink plenty of fluids and get plenty of rest. A steam or ultrasonic humidifier can help if you have congestion.   GET HELP RIGHT AWAY IF YOU HAVE EMERGENCY WARNING SIGNS.  Call 911 or proceed to your closest emergency facility if: You develop worsening high fever. Trouble breathing Bluish lips or face Persistent pain or pressure in the chest New confusion Inability to wake or stay awake You cough up blood. Your symptoms become more severe Inability to hold down food or fluids  This list is not all possible symptoms. Contact your medical provider for any symptoms that are severe or concerning to you.   Your e-visit answers were reviewed by a board certified advanced clinical practitioner to complete your personal care plan.  Depending on the condition, your plan could have included both over the counter or prescription medications.  If there is a problem please reply once you have received a response from your provider.  Your safety is important to [place].  If you have drug allergies check your prescription carefully.    You can use MyChart to ask questions about today's visit, request a non-urgent call back, or ask for a work or school excuse for 24 hours related to this e-Visit. If it has been greater than 24 hours you will need to follow up  with your provider, or enter a new e-Visit to address those concerns. You will get an e-mail in the next two days asking about your experience.  I hope that your e-visit has been valuable and will speed your recovery. Thank you for using e-visits.     I have spent 5 minutes in review of e-visit questionnaire, review and updating patient chart, medical decision making and response to patient.   Margaretann Loveless, PA-C

## 2022-11-11 ENCOUNTER — Other Ambulatory Visit: Payer: Self-pay | Admitting: Internal Medicine

## 2022-11-11 DIAGNOSIS — E782 Mixed hyperlipidemia: Secondary | ICD-10-CM

## 2022-11-14 ENCOUNTER — Other Ambulatory Visit: Payer: Self-pay | Admitting: Internal Medicine

## 2022-12-13 ENCOUNTER — Ambulatory Visit: Payer: Commercial Managed Care - PPO

## 2022-12-23 ENCOUNTER — Encounter: Payer: Self-pay | Admitting: Internal Medicine

## 2022-12-23 ENCOUNTER — Ambulatory Visit: Payer: Commercial Managed Care - PPO | Admitting: Internal Medicine

## 2022-12-23 VITALS — BP 114/86 | HR 76 | Ht 63.0 in | Wt 195.4 lb

## 2022-12-23 DIAGNOSIS — E1169 Type 2 diabetes mellitus with other specified complication: Secondary | ICD-10-CM

## 2022-12-23 DIAGNOSIS — Z23 Encounter for immunization: Secondary | ICD-10-CM

## 2022-12-23 DIAGNOSIS — I152 Hypertension secondary to endocrine disorders: Secondary | ICD-10-CM

## 2022-12-23 DIAGNOSIS — E669 Obesity, unspecified: Secondary | ICD-10-CM | POA: Diagnosis not present

## 2022-12-23 DIAGNOSIS — R002 Palpitations: Secondary | ICD-10-CM

## 2022-12-23 DIAGNOSIS — E876 Hypokalemia: Secondary | ICD-10-CM

## 2022-12-23 DIAGNOSIS — Z7985 Long-term (current) use of injectable non-insulin antidiabetic drugs: Secondary | ICD-10-CM

## 2022-12-23 DIAGNOSIS — E1159 Type 2 diabetes mellitus with other circulatory complications: Secondary | ICD-10-CM | POA: Diagnosis not present

## 2022-12-23 DIAGNOSIS — R928 Other abnormal and inconclusive findings on diagnostic imaging of breast: Secondary | ICD-10-CM

## 2022-12-23 DIAGNOSIS — K7581 Nonalcoholic steatohepatitis (NASH): Secondary | ICD-10-CM

## 2022-12-23 DIAGNOSIS — R001 Bradycardia, unspecified: Secondary | ICD-10-CM | POA: Diagnosis not present

## 2022-12-23 DIAGNOSIS — I499 Cardiac arrhythmia, unspecified: Secondary | ICD-10-CM | POA: Insufficient documentation

## 2022-12-23 DIAGNOSIS — T502X5A Adverse effect of carbonic-anhydrase inhibitors, benzothiadiazides and other diuretics, initial encounter: Secondary | ICD-10-CM

## 2022-12-23 DIAGNOSIS — E1151 Type 2 diabetes mellitus with diabetic peripheral angiopathy without gangrene: Secondary | ICD-10-CM

## 2022-12-23 DIAGNOSIS — E039 Hypothyroidism, unspecified: Secondary | ICD-10-CM

## 2022-12-23 DIAGNOSIS — I10 Essential (primary) hypertension: Secondary | ICD-10-CM

## 2022-12-23 LAB — POCT GLYCOSYLATED HEMOGLOBIN (HGB A1C): Hemoglobin A1C: 6.1 % — AB (ref 4.0–5.6)

## 2022-12-23 MED ORDER — ALPRAZOLAM 0.5 MG PO TABS: 0.5000 mg | ORAL_TABLET | Freq: Every day | ORAL | Status: AC | PRN

## 2022-12-23 MED ORDER — LOSARTAN POTASSIUM-HCTZ 100-25 MG PO TABS: 1.0000 | ORAL_TABLET | Freq: Every day | ORAL | 1 refills | Status: DC

## 2022-12-23 NOTE — Assessment & Plan Note (Signed)
Well controlled on current regimen. Renal function is due, no changes today. °

## 2022-12-23 NOTE — Assessment & Plan Note (Addendum)
Now excellent control with use of semiglutide,  compounded and received from [facility]

## 2022-12-23 NOTE — Assessment & Plan Note (Signed)
Secondary to use of hctz  .  Will supplement until follow up can be made for medication change.

## 2022-12-23 NOTE — Assessment & Plan Note (Signed)
She is taking compounded semiglutide obtained through mail order pharmacy

## 2022-12-23 NOTE — Patient Instructions (Signed)
Please return for labs at your earliest convenience   I am ordering a ZIO monitor for you to wear for 2 weeks to monitor your heart rhythms .  It will come to your house and you can apply it to your chest wall.  If you have any trouble applying it you can schedule a RN visit in our office and we will help you

## 2022-12-23 NOTE — Assessment & Plan Note (Signed)
Thyroid function has been  is WNL on 88 mcg  Daily  dose.  No current changes needed.   Lab Results  Component Value Date   TSH 4.12 04/03/2022

## 2022-12-23 NOTE — Progress Notes (Signed)
Subjective:  Patient ID: [name], female    DOB: 1971-02-06  Age: 52 y.o. MRN: [id number]  CC: The primary encounter diagnosis was Palpitations. Diagnoses of Obesity, diabetes, and hypertension syndrome (HCC), Irregular heart beat, Bradycardia, Need for influenza vaccination, NASH (nonalcoholic steatohepatitis), Abnormal mammogram of left breast, DM (diabetes mellitus), type 2 with peripheral vascular complications [facility]), Cardiac arrhythmia, unspecified cardiac arrhythmia type, Diuretic-induced hypokalemia, Primary hypertension, Acquired hypothyroidism, Morbid obesity (HCC), and Diabetes mellitus treated with injections of non-insulin medication (HCC) were also pertinent to this visit.   HPI Christina Stafford presents for  Chief Complaint  Patient presents with   Medical Management of Chronic Issues   1) Type 2 DM/obesity/htn:  she has been taking compounded semiglutide January ,  currently taking 2.0 mg of semiglutide  and still losing 2-3 lbs per weeek.   A1c has improved from 8.1   to  6.1  and weight is down 20 lbs .  She is getting semiglutide via   [email or web address]  as her source.  The cost  $297/month. The medication comes in a vial .  Loads syringe herself   also given rx for zofran.  Had a face to face virtual with supplier.  No constipation with semiglutide .  Added Gruns MVI gummies   2) arrhythmia: Irregular heartbeat has been detected by apple watch occurs infrequently over the  last 6 months  has occurred 4 -5 times , always late at night while sitting up watching TB . Heart  "Starts beating out of my chest "   Sometimes feels like the blood is draining fro her head when her heart rate slows down. Has noted HR  drop from 79 0 to 44 graduallly .    No change from 2019 but abnormal   3) GAD: she has been using alprazolam sparingly,  mostly prn insomnia after being finding out that her job was in jeopard.  She received an e mail that she was being put on probation      Outpatient Medications Prior to Visit  Medication Sig Dispense Refill   blood glucose meter kit and supplies Dispense based on patient and insurance preference. Use to check blood sugars twice daily. 1 each 0   fexofenadine (ALLEGRA) 180 MG tablet Take 180 mg by mouth daily.     glucose blood test strip Use to check blood sugars twice daily. 100 each 5   levothyroxine (SYNTHROID) 88 MCG tablet TAKE 1 TABLET EVERY DAY ON EMPTY STOMACHWITH A GLASS OF WATER AT LEAST 30-60 MINBEFORE BREAKFAST 90 tablet 1   metFORMIN (GLUCOPHAGE) 500 MG tablet TAKE ONE TABLET TWICE A DAY WITH MEALS 180 tablet 1   metoprolol succinate (TOPROL-XL) 25 MG 24 hr tablet TAKE ONE TABLET (25 MG) BY MOUTH EVERY DAY 90 tablet 1   naproxen (NAPROSYN) 500 MG tablet Take 1 tablet (500 mg total) by mouth 2 (two) times daily with a meal. 20 tablet 0   OVER THE COUNTER MEDICATION Take 1 tablet by mouth at bedtime. 0.3% THC Gummy     rosuvastatin (CRESTOR) 20 MG tablet TAKE 1 TABLET BY MOUTH DAILY. 90 tablet 1   Semaglutide,0.25 or 0.5MG /DOS, 2 MG/3ML SOPN Inject 0.5 mg into the skin once a week. 3 mL 1   sertraline (ZOLOFT) 50 MG tablet TAKE ONE AND ONE-HALF TABLETS DAILY 135 tablet 1   hydrochlorothiazide (MICROZIDE) 12.5 MG capsule TAKE 1 CAPSULE BY MOUTH ONCE DAILY 90 capsule 1   losartan-hydrochlorothiazide (HYZAAR) 100-25 MG tablet  TAKE ONE TABLET BY MOUTH EVERY DAY 90 tablet 0   albuterol (VENTOLIN HFA) 108 (90 Base) MCG/ACT inhaler Inhale 1-2 puffs into the lungs every 6 (six) hours as needed. (Patient not taking: Reported on 12/23/2022) 8 g 0   esomeprazole (NEXIUM) 20 MG capsule Take 20 mg by mouth daily. (Patient not taking: Reported on 12/23/2022)     fluticasone (FLONASE) 50 MCG/ACT nasal spray Place 2 sprays into both nostrils daily. (Patient not taking: Reported on 12/23/2022) 16 g 0   No facility-administered medications prior to visit.    Review of Systems;  Patient denies headache, fevers, malaise,  unintentional weight loss, skin rash, eye pain, sinus congestion and sinus pain, sore throat, dysphagia,  hemoptysis , cough, dyspnea, wheezing, chest pain, palpitations, orthopnea, edema, abdominal pain, nausea, melena, diarrhea, constipation, flank pain, dysuria, hematuria, urinary  Frequency, nocturia, numbness, tingling, seizures,  Focal weakness, Loss of consciousness,  Tremor, insomnia, depression, anxiety, and suicidal ideation.      Objective:  BP 114/86   Pulse 76   Ht 5\' 3"  (1.6 m)   Wt 195 lb 6.4 oz (88.6 kg)   LMP 01/20/2017 (Approximate)   SpO2 96%   BMI 34.61 kg/m   BP Readings from Last 3 Encounters:  12/23/22 114/86  05/07/22 130/88  04/08/22 136/88    Wt Readings from Last 3 Encounters:  12/23/22 195 lb 6.4 oz (88.6 kg)  05/07/22 213 lb 13.5 oz (97 kg)  04/08/22 213 lb 12.8 oz (97 kg)    Physical Exam Vitals reviewed.  Constitutional:      General: She is not in acute distress.    Appearance: Normal appearance. She is normal weight. She is not ill-appearing, toxic-appearing or diaphoretic.  HENT:     Head: Normocephalic.  Eyes:     General: No scleral icterus.       Right eye: No discharge.        Left eye: No discharge.     Conjunctiva/sclera: Conjunctivae normal.  Cardiovascular:     Rate and Rhythm: Normal rate and regular rhythm.     Heart sounds: Normal heart sounds.  Pulmonary:     Effort: Pulmonary effort is normal. No respiratory distress.     Breath sounds: Normal breath sounds.  Musculoskeletal:        General: Normal range of motion.  Skin:    General: Skin is warm and dry.  Neurological:     General: No focal deficit present.     Mental Status: She is alert and oriented to person, place, and time. Mental status is at baseline.  Psychiatric:        Mood and Affect: Mood normal.        Behavior: Behavior normal.        Thought Content: Thought content normal.        Judgment: Judgment normal.    Lab Results  Component Value Date    HGBA1C 6.1 (A) 12/23/2022   HGBA1C 8.1 (H) 04/03/2022   HGBA1C 7.5 (H) 03/20/2022    Lab Results  Component Value Date   CREATININE 0.81 05/07/2022   CREATININE 0.71 04/03/2022   CREATININE 0.82 03/20/2022    Lab Results  Component Value Date   WBC 8.0 05/07/2022   HGB 13.3 05/07/2022   HCT 38.7 05/07/2022   PLT 318 05/07/2022   GLUCOSE 155 (H) 05/07/2022   CHOL 186 04/03/2022   TRIG 253.0 (H) 04/03/2022   HDL 41.40 04/03/2022   LDLDIRECT 106.0 04/03/2022  LDLCALC 100 (H) 10/04/2021   ALT 40 05/07/2022   AST 44 (H) 05/07/2022   NA 135 05/07/2022   K 3.0 (L) 05/07/2022   CL 98 05/07/2022   CREATININE 0.81 05/07/2022   BUN 11 05/07/2022   CO2 25 05/07/2022   TSH 4.12 04/03/2022   INR 0.9 02/07/2020   HGBA1C 6.1 (A) 12/23/2022   MICROALBUR 2.9 (H) 04/08/2022    CT ABDOMEN PELVIS W CONTRAST  Result Date: 05/07/2022 CLINICAL DATA:  Left lower quadrant pain for several days, initial encounter EXAM: CT ABDOMEN AND PELVIS WITH CONTRAST TECHNIQUE: Multidetector CT imaging of the abdomen and pelvis was performed using the standard protocol following bolus administration of intravenous contrast. RADIATION DOSE REDUCTION: This exam was performed according to the departmental dose-optimization program which includes automated exposure control, adjustment of the mA and/or kV according to patient size and/or use of iterative reconstruction technique. CONTRAST:  OMNIPAQUE IOHEXOL 300 MG/ML  SOLN COMPARISON:  08/27/2006 FINDINGS: Lower chest: No acute abnormality. Hepatobiliary: Fatty infiltration of the liver is noted. Gallbladder has been surgically removed. Pancreas: Unremarkable. No pancreatic ductal dilatation or surrounding inflammatory changes. Spleen: Normal in size without focal abnormality. Adrenals/Urinary Tract: Adrenal glands are within normal limits. Kidneys demonstrate a normal enhancement pattern bilaterally. No renal calculi or obstructive changes are seen. The bladder  is well distended. Stomach/Bowel: There are pericolonic inflammatory changes in the mid descending colon consistent with focal diverticulitis. No perforation or abscess is noted. The more proximal colon appears within normal limits. The appendix is unremarkable. Small bowel and stomach show no acute abnormality. Vascular/Lymphatic: Aortic atherosclerosis. No enlarged abdominal or pelvic lymph nodes. Reproductive: Status post hysterectomy. No adnexal masses. Other: No abdominal wall hernia or abnormality. No abdominopelvic ascites. Musculoskeletal: Degenerative changes of lumbar spine are noted. 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 IMPRESSION: Pericolonic inflammatory changes consistent with focal diverticulitis in the proximal descending colon. No abscess or perforation is noted. Fatty liver. No other acute abnormality is noted. Electronically Signed   By: Alcide Clever M.D.   On: 05/07/2022 17:48    Assessment & Plan:  .Palpitations -     EKG 12-Lead -     Comprehensive metabolic panel; Future -     Microalbumin / creatinine urine ratio; Future  Obesity, diabetes, and hypertension syndrome (HCC) -     POCT glycosylated hemoglobin (Hb A1C)  Irregular heart beat -     LONG TERM MONITOR (3-14 DAYS); Future -     Magnesium; Future  Bradycardia -     LONG TERM MONITOR (3-14 DAYS); Future -     Magnesium; Future -     TSH; Future  Need for influenza vaccination -     Flu vaccine trivalent PF, 6mos and older(Flulaval,Afluria,Fluarix,Fluzone)  NASH (nonalcoholic steatohepatitis) Assessment & Plan: By biopsy .  No fibrosis. Taking statin, semiglutide  and metformin.   Repeat labs are due Lab Results  Component Value Date   ALT 40 05/07/2022   AST 44 (H) 05/07/2022   ALKPHOS 87 05/07/2022   BILITOT 0.8 05/07/2022      Abnormal mammogram of left breast Assessment & Plan: Excisional Breast biopsy has been done of left breast : radial  scar     DM (diabetes mellitus), type 2 with peripheral vascular complications (HCC) Assessment & Plan: Now excellent control with use of semiglutide,  compounded and received from [facility]    Cardiac arrhythmia, unspecified cardiac arrhythmia type Assessment &  Plan: She reports several episodes of bradycardia occurring while at rest in the evening .  Rate hs dropped into  the mid 40's with symptoms of presyncope.  I have ordered and reviewed a 12 lead EKG and find that there are no acute changes comparied to 2019 EKG,  and patient is in sinus rhythm.   ZIO monitor ordered    Diuretic-induced hypokalemia Assessment & Plan: Secondary to use of hctz  .  Will supplement until follow up can be made for medication change.    Primary hypertension Assessment & Plan: Well controlled on current regimen. Renal function  is due,  no changes today.    Acquired hypothyroidism Assessment & Plan: Thyroid function has been  is WNL on 88 mcg  Daily  dose.  No current changes needed.   Lab Results  Component Value Date   TSH 4.12 04/03/2022      Morbid obesity (HCC) Assessment & Plan: Complicated by diabetes and hypertension .  She is losing weight with compounded Ozempic and currently taking 2 mg weekly    Diabetes mellitus treated with injections of non-insulin medication (HCC) Assessment & Plan: She is taking compounded semiglutide obtained through mail order pharmacy    Other orders -     Losartan Potassium-HCTZ; Take 1 tablet by mouth daily.  Dispense: 90 tablet; Refill: 1 -     ALPRAZolam; Take 1 tablet (0.5 mg total) by mouth daily as needed for anxiety (or panic attacks).  Dispense: 30 tablet; Refill: 02     I provided 40 minutes of face-to-face time during this encounter reviewing patient's last visit with me, recent surgical and non surgical procedures, previous  labs and imaging studies, counseling on currently addressed issues,  and post visit ordering to diagnostics and  therapeutics .   Follow-up: Return in about 3 months (around 03/24/2023).   Sherlene Shams, MD

## 2022-12-23 NOTE — Assessment & Plan Note (Signed)
Complicated by diabetes and hypertension .  She is losing weight with compounded Ozempic and currently taking 2 mg weekly

## 2022-12-23 NOTE — Assessment & Plan Note (Signed)
By biopsy .  No fibrosis. Taking statin, semiglutide  and metformin.   Repeat labs are due Lab Results  Component Value Date   ALT 40 05/07/2022   AST 44 (H) 05/07/2022   ALKPHOS 87 05/07/2022   BILITOT 0.8 05/07/2022

## 2022-12-23 NOTE — Assessment & Plan Note (Addendum)
She reports several episodes of bradycardia occurring while at rest in the evening .  Rate hs dropped into  the mid 40's with symptoms of presyncope.  I have ordered and reviewed a 12 lead EKG and find that there are no acute changes comparied to 2019 EKG,  and patient is in sinus rhythm.   ZIO monitor ordered

## 2022-12-23 NOTE — Assessment & Plan Note (Addendum)
Excisional Breast biopsy has been done of left breast : radial  scar

## 2022-12-24 ENCOUNTER — Ambulatory Visit: Payer: Commercial Managed Care - PPO | Attending: Internal Medicine

## 2022-12-24 DIAGNOSIS — I499 Cardiac arrhythmia, unspecified: Secondary | ICD-10-CM

## 2022-12-24 DIAGNOSIS — R001 Bradycardia, unspecified: Secondary | ICD-10-CM

## 2022-12-26 ENCOUNTER — Emergency Department
Admission: EM | Admit: 2022-12-26 | Discharge: 2022-12-27 | Disposition: A | Discharge status: Home / Self Care | Payer: Commercial Managed Care - PPO | Attending: Emergency Medicine | Admitting: Emergency Medicine

## 2022-12-26 ENCOUNTER — Emergency Department: Payer: Commercial Managed Care - PPO

## 2022-12-26 ENCOUNTER — Other Ambulatory Visit: Payer: Self-pay

## 2022-12-26 DIAGNOSIS — E876 Hypokalemia: Secondary | ICD-10-CM | POA: Insufficient documentation

## 2022-12-26 DIAGNOSIS — R002 Palpitations: Secondary | ICD-10-CM | POA: Diagnosis present

## 2022-12-26 DIAGNOSIS — E039 Hypothyroidism, unspecified: Secondary | ICD-10-CM | POA: Insufficient documentation

## 2022-12-26 DIAGNOSIS — I10 Essential (primary) hypertension: Secondary | ICD-10-CM | POA: Diagnosis not present

## 2022-12-26 LAB — CBC
HCT: 34.7 % — ABNORMAL LOW (ref 36.0–46.0)
Hemoglobin: 12.7 g/dL (ref 12.0–15.0)
MCH: 30.2 pg (ref 26.0–34.0)
MCHC: 36.6 g/dL — ABNORMAL HIGH (ref 30.0–36.0)
MCV: 82.4 fL (ref 80.0–100.0)
Platelets: 323 10*3/uL (ref 150–400)
RBC: 4.21 MIL/uL (ref 3.87–5.11)
RDW: 13.5 % (ref 11.5–15.5)
WBC: 8.5 10*3/uL (ref 4.0–10.5)
nRBC: 0 % (ref 0.0–0.2)

## 2022-12-26 LAB — BASIC METABOLIC PANEL
Anion gap: 17 — ABNORMAL HIGH (ref 5–15)
BUN: 10 mg/dL (ref 6–20)
CO2: 26 mmol/L (ref 22–32)
Calcium: 9.7 mg/dL (ref 8.9–10.3)
Chloride: 94 mmol/L — ABNORMAL LOW (ref 98–111)
Creatinine, Ser: 0.62 mg/dL (ref 0.44–1.00)
GFR, Estimated: >60 mL/min (ref >60)
Glucose, Bld: 158 mg/dL — ABNORMAL HIGH (ref 70–99)
Potassium: 2.5 mmol/L — CL (ref 3.5–5.1)
Sodium: 137 mmol/L (ref 135–145)

## 2022-12-26 LAB — TROPONIN I (HIGH SENSITIVITY): Troponin I (High Sensitivity): 3 ng/L (ref <18)

## 2022-12-26 MED ORDER — POTASSIUM CHLORIDE CRYS ER 20 MEQ PO TBCR
40.0000 meq | EXTENDED_RELEASE_TABLET | Freq: Once | ORAL | Status: AC
  Administered 2022-12-27: 40 meq via ORAL
  Filled 2022-12-26: qty 2

## 2022-12-26 MED ORDER — POTASSIUM CHLORIDE 10 MEQ/100ML IV SOLN
10.0000 meq | INTRAVENOUS | Status: AC
  Administered 2022-12-27 (×2): 10 meq via INTRAVENOUS
  Filled 2022-12-26 (×2): qty 100

## 2022-12-26 NOTE — ED Triage Notes (Addendum)
Pt to ED via POV c/o palpitations. Pt reports she has episodes of forceful beating that last about 30s-50min, feels it in upper chest and throat. Denies any CP, SOB, fevers. Saw doctor for same on Monday and was told to come here if it happened again

## 2022-12-27 ENCOUNTER — Other Ambulatory Visit: Payer: Self-pay

## 2022-12-27 LAB — POTASSIUM: Potassium: 3 mmol/L — ABNORMAL LOW (ref 3.5–5.1)

## 2022-12-27 LAB — T4, FREE: Free T4: 1.11 ng/dL (ref 0.61–1.12)

## 2022-12-27 LAB — TSH: TSH: 4.256 u[IU]/mL (ref 0.350–4.500)

## 2022-12-27 LAB — TROPONIN I (HIGH SENSITIVITY): Troponin I (High Sensitivity): 3 ng/L (ref <18)

## 2022-12-27 LAB — MAGNESIUM: Magnesium: 1.8 mg/dL (ref 1.7–2.4)

## 2022-12-27 MED ORDER — POTASSIUM CHLORIDE CRYS ER 20 MEQ PO TBCR
40.0000 meq | EXTENDED_RELEASE_TABLET | Freq: Once | ORAL | Status: AC
  Administered 2022-12-27: 40 meq via ORAL
  Filled 2022-12-27: qty 2

## 2022-12-27 NOTE — ED Provider Notes (Addendum)
[facility] Provider Note    Event Date/Time   First MD Initiated Contact with Patient 12/26/22 2314     (approximate)   History   Palpitations   HPI  Christina Stafford is a 52 y.o. female   Past medical history of diabetes on semaglutide, hypothyroid, hypertension who presents to the Emergency Department with palpitations.  Over the last several days intermittent sensation that her heart is beating fast and hard, lasting for seconds at a time up to 1 minute.  No obvious inciting events or alleviating factors.  Mostly when she is resting at night she feels the sensations more.  Not associated with chest pain or shortness of breath.  No recent medical illnesses or changes in medications except for the fact that she started on semaglutide in January and has had some mild GI effects.  Denies excessive caffeine intake, alcohol use or drug use.  Independent Historian contributed to assessment above: Husband at bedside corroborates information given above  External Medical Documents Reviewed: Outpatient visit earlier this month for palpitations, ordered for ZIO patch monitor      Physical Exam   Triage Vital Signs: ED Triage Vitals  Encounter Vitals Group     BP 12/26/22 2224 (!) 137/94     Systolic BP Percentile --      Diastolic BP Percentile --      Pulse Rate 12/26/22 2224 87     Resp 12/26/22 2224 19     Temp 12/26/22 2224 98.5 F (36.9 C)     Temp src --      SpO2 12/26/22 2224 97 %     Weight 12/26/22 2224 191 lb (86.6 kg)     Height 12/26/22 2224 5\' 3"  (1.6 m)     Head Circumference --      Peak Flow --      Pain Score 12/26/22 2224 0     Pain Loc --      Pain Education --      Exclude from Growth Chart --     Most recent vital signs: Vitals:   12/27/22 0300 12/27/22 0400  BP: 124/86 126/89  Pulse: 76 80  Resp: 13 15  Temp:    SpO2: 100% 94%    General: Awake, no distress.  CV:  Good peripheral perfusion.  Resp:  Normal  effort.  Abd:  No distention.  Other:  Awake alert comfortable appearing no acute distress with normal vital signs, euvolemic appearing, clear lungs, heart sounds normal rate and rhythm without murmurs.   ED Results / Procedures / Treatments   Labs (all labs ordered are listed, but only abnormal results are displayed) Labs Reviewed  BASIC METABOLIC PANEL - Abnormal; Notable for the following components:      Result Value   Potassium 2.5 (*)    Chloride 94 (*)    Glucose, Bld 158 (*)    Anion gap 17 (*)    All other components within normal limits  CBC - Abnormal; Notable for the following components:   HCT 34.7 (*)    MCHC 36.6 (*)    All other components within normal limits  POTASSIUM - Abnormal; Notable for the following components:   Potassium 3.0 (*)    All other components within normal limits  MAGNESIUM  TSH  T4, FREE  TROPONIN I (HIGH SENSITIVITY)  TROPONIN I (HIGH SENSITIVITY)     I ordered and reviewed the above labs they are notable for potassium is low  at 2.5.  EKG  ED ECG REPORT I, Pilar Jarvis, the attending physician, personally viewed and interpreted this ECG.   Date: 12/27/2022  EKG Time: 2222  Rate: 92  Rhythm: sinus  Axis: nl  Intervals: long qtc 490s  ST&T Change: no stemi    RADIOLOGY I independently reviewed and interpreted chest x-ray and I see no obvious focality or pneumothorax I also reviewed radiologist's formal read.   PROCEDURES:  Critical Care performed: No  Procedures   MEDICATIONS ORDERED IN ED: Medications  potassium chloride SA (KLOR-CON M) CR tablet 40 mEq (has no administration in time range)  potassium chloride 10 mEq in 100 mL IVPB (0 mEq Intravenous Stopped 12/27/22 0301)  potassium chloride SA (KLOR-CON M) CR tablet 40 mEq (40 mEq Oral Given 12/27/22 0051)     IMPRESSION / MDM / ASSESSMENT AND PLAN / ED COURSE  I reviewed the triage vital signs and the nursing notes.                                 Patient's presentation is most consistent with acute presentation with potential threat to life or bodily function.  Differential diagnosis includes, but is not limited to, dysrhythmia, electrolyte disturbance, ACS, PE  The patient is on the cardiac monitor to evaluate for evidence of arrhythmia and/or significant heart rate changes.  MDM:    Patient with sensation of palpitations intermittently with a normal-appearing EKG slightly prolonged QTc in the setting of hypokalemia.  Perhaps a result of her semaglutide GI symptoms decreased appetite, last labs obtained February showed a decrease in her potassium at that point was 3.0 compared to a normal prior to starting semaglutide, will check magnesium, check thyroid, potassium repletion keep on cardiac monitoring.  I offered and considered admission however patient appears well, no recurrence of symptoms, and our plan will be to replete potassium, recheck EKG intervals and if normalizing will be discharged with PMD follow-up Zio patch as planned, increase potassium intake, and recheck labs as outpatient.    -- Recheck potassium now 3.0 from 2.5, remainder of testing unremarkable patient on cardiac monitor without any further palpitations or dysrhythmias noted.  Given additional 40 mill equivalents p.o. prior to discharge, will increase K intake dietary, follow-up with PMD.        FINAL CLINICAL IMPRESSION(S) / ED DIAGNOSES   Final diagnoses:  Palpitations  Hypokalemia     Rx / DC Orders   ED Discharge Orders     None        Note:  This document was prepared using Dragon voice recognition software and may include unintentional dictation errors.    Pilar Jarvis, MD 12/27/22 Ivor Reining    Pilar Jarvis, MD 12/27/22 [phone]

## 2022-12-27 NOTE — Discharge Instructions (Signed)
Your potassium was low.  See the attached instructions on hypokalemia regarding dietary changes you can make to increase your intake of potassium and have your doctor recheck these levels within the next 1 to 2 weeks.  Follow-up with your primary doctor for the Zio patch heart monitoring as planned.  Thank you for choosing [place] for your health care today!  Please see your primary doctor this week for a follow up appointment.   If you have any new, worsening, or unexpected symptoms call your doctor right away or come back to the emergency department for reevaluation.  It was my pleasure to care for you today.   Daneil Dan Modesto Charon, MD

## 2022-12-30 ENCOUNTER — Telehealth: Payer: Self-pay | Admitting: Internal Medicine

## 2022-12-30 DIAGNOSIS — E876 Hypokalemia: Secondary | ICD-10-CM

## 2022-12-30 NOTE — Transitions of Care (Post Inpatient/ED Visit) (Signed)
Unable to reach patient by phone and left v/m requesting call back at [phone].       12/30/2022  Name: Christina Stafford MRN: [id number] DOB: [birth year]  Today's TOC FU Call Status: Today's TOC FU Call Status:: Unsuccessful Call (1st Attempt) Unsuccessful Call (1st Attempt) Date: 12/30/22  Attempted to reach the patient regarding the most recent Inpatient/ED visit.  Follow Up Plan: Additional outreach attempts will be made to reach the patient to complete the Transitions of Care (Post Inpatient/ED visit) call.   Signature  Lewanda Rife, LPN

## 2022-12-30 NOTE — Telephone Encounter (Signed)
Pt is scheduled for the lab appt on 01/01/2023.

## 2022-12-30 NOTE — Addendum Note (Signed)
Addended by: Sherlene Shams on: 12/30/2022 02:52 PM   Modules accepted: Orders

## 2022-12-30 NOTE — Telephone Encounter (Signed)
Spoke with pt and scheduled her for an ED follow up with you on Friday. Pt is scheduled for follow up labs on Wednesday. Pt stated that she is currently taking Potassium 650 mg 2 tablets daily. Is there anything else that pt needs to be doing or just continue and have labs rechecked on Wednesday.

## 2022-12-30 NOTE — Telephone Encounter (Signed)
Patient had a ER visit and they found out she was potassium deficit. She wanted to know if she can get on some type of supplement. The pharmacy she uses is [id number] [place], [place] - [address] AT [facility] OF [address] [place], [place] [address] Phone: [phone]  Fax: [phone]  Her number is [phone]

## 2022-12-31 DIAGNOSIS — I499 Cardiac arrhythmia, unspecified: Secondary | ICD-10-CM | POA: Diagnosis not present

## 2022-12-31 DIAGNOSIS — R001 Bradycardia, unspecified: Secondary | ICD-10-CM | POA: Diagnosis not present

## 2022-12-31 NOTE — Transitions of Care (Post Inpatient/ED Visit) (Signed)
Unable to reach patient by phone and left v/m requesting call back at [phone].         12/31/2022  Name: Christina Stafford MRN: [id number] DOB: [birth year]  Today's TOC FU Call Status: Today's TOC FU Call Status:: Unsuccessful Call (2nd Attempt) Unsuccessful Call (1st Attempt) Date: 12/30/22 Unsuccessful Call (2nd Attempt) Date: 12/31/22  Attempted to reach the patient regarding the most recent Inpatient/ED visit.  Follow Up Plan: Additional outreach attempts will be made to reach the patient to complete the Transitions of Care (Post Inpatient/ED visit) call.   Signature Lewanda Rife, LPN

## 2023-01-01 ENCOUNTER — Ambulatory Visit
Admission: RE | Admit: 2023-01-01 | Discharge: 2023-01-01 | Disposition: A | Discharge status: Home / Self Care | Payer: Commercial Managed Care - PPO | Source: Ambulatory Visit | Attending: General Surgery | Admitting: General Surgery

## 2023-01-01 ENCOUNTER — Other Ambulatory Visit (INDEPENDENT_AMBULATORY_CARE_PROVIDER_SITE_OTHER): Payer: Commercial Managed Care - PPO

## 2023-01-01 DIAGNOSIS — R002 Palpitations: Secondary | ICD-10-CM | POA: Diagnosis not present

## 2023-01-01 DIAGNOSIS — I499 Cardiac arrhythmia, unspecified: Secondary | ICD-10-CM

## 2023-01-01 DIAGNOSIS — I10 Essential (primary) hypertension: Secondary | ICD-10-CM

## 2023-01-01 DIAGNOSIS — E876 Hypokalemia: Secondary | ICD-10-CM | POA: Diagnosis not present

## 2023-01-01 DIAGNOSIS — R001 Bradycardia, unspecified: Secondary | ICD-10-CM | POA: Diagnosis not present

## 2023-01-01 DIAGNOSIS — R92343 Mammographic extreme density, bilateral breasts: Secondary | ICD-10-CM

## 2023-01-01 LAB — BASIC METABOLIC PANEL
BUN: 8 mg/dL (ref 6–23)
CO2: 32 meq/L (ref 19–32)
Calcium: 10.4 mg/dL (ref 8.4–10.5)
Chloride: 95 meq/L — ABNORMAL LOW (ref 96–112)
Creatinine, Ser: 0.68 mg/dL (ref 0.40–1.20)
GFR: 98.75 mL/min (ref >60.00)
Glucose, Bld: 102 mg/dL — ABNORMAL HIGH (ref 70–99)
Potassium: 3.3 meq/L — ABNORMAL LOW (ref 3.5–5.1)
Sodium: 136 meq/L (ref 135–145)

## 2023-01-01 LAB — COMPREHENSIVE METABOLIC PANEL
ALT: 42 U/L — ABNORMAL HIGH (ref 0–35)
AST: 35 U/L (ref 0–37)
Albumin: 4.7 g/dL (ref 3.5–5.2)
Alkaline Phosphatase: 92 U/L (ref 39–117)
BUN: 8 mg/dL (ref 6–23)
CO2: 32 meq/L (ref 19–32)
Calcium: 10.4 mg/dL (ref 8.4–10.5)
Chloride: 95 meq/L — ABNORMAL LOW (ref 96–112)
Creatinine, Ser: 0.68 mg/dL (ref 0.40–1.20)
GFR: 98.75 mL/min (ref >60.00)
Glucose, Bld: 102 mg/dL — ABNORMAL HIGH (ref 70–99)
Potassium: 3.3 meq/L — ABNORMAL LOW (ref 3.5–5.1)
Sodium: 136 meq/L (ref 135–145)
Total Bilirubin: 0.5 mg/dL (ref 0.2–1.2)
Total Protein: 7.8 g/dL (ref 6.0–8.3)

## 2023-01-01 LAB — MICROALBUMIN / CREATININE URINE RATIO
Creatinine,U: 271.1 mg/dL
Microalb Creat Ratio: 0.8 mg/g (ref 0.0–30.0)
Microalb, Ur: 2.3 mg/dL — ABNORMAL HIGH (ref 0.0–1.9)

## 2023-01-01 LAB — TSH: TSH: 1.47 u[IU]/mL (ref 0.35–5.50)

## 2023-01-01 LAB — MAGNESIUM: Magnesium: 1.7 mg/dL (ref 1.5–2.5)

## 2023-01-01 NOTE — Transitions of Care (Post Inpatient/ED Visit) (Signed)
Unable to reach pt by phone; mper mappt notes pt already has appt for labs 01/01/23 and appt with Dr Darrick Huntsman on 01/03/23. Sending note to Dr Darrick Huntsman.       01/01/2023  Name: Christina Stafford MRN: [id number] DOB: May 06, 1970  Today's TOC FU Call Status: Today's TOC FU Call Status:: Unsuccessful Call (3rd Attempt) Unsuccessful Call (1st Attempt) Date: 12/30/22 Unsuccessful Call (2nd Attempt) Date: 12/31/22 Unsuccessful Call (3rd Attempt) Date: 01/01/23  Attempted to reach the patient regarding the most recent Inpatient/ED visit.  Follow Up Plan: No further outreach attempts will be made at this time. We have been unable to contact the patient.  Signature Lewanda Rife, LPN

## 2023-01-03 ENCOUNTER — Ambulatory Visit: Payer: Commercial Managed Care - PPO | Admitting: Internal Medicine

## 2023-01-03 ENCOUNTER — Encounter: Payer: Self-pay | Admitting: Internal Medicine

## 2023-01-03 VITALS — BP 96/80 | HR 77 | Ht 63.0 in | Wt 192.6 lb

## 2023-01-03 DIAGNOSIS — I10 Essential (primary) hypertension: Secondary | ICD-10-CM

## 2023-01-03 DIAGNOSIS — E876 Hypokalemia: Secondary | ICD-10-CM | POA: Diagnosis not present

## 2023-01-03 DIAGNOSIS — I952 Hypotension due to drugs: Secondary | ICD-10-CM | POA: Diagnosis not present

## 2023-01-03 DIAGNOSIS — E1169 Type 2 diabetes mellitus with other specified complication: Secondary | ICD-10-CM | POA: Diagnosis not present

## 2023-01-03 DIAGNOSIS — Z7985 Long-term (current) use of injectable non-insulin antidiabetic drugs: Secondary | ICD-10-CM

## 2023-01-03 DIAGNOSIS — E669 Obesity, unspecified: Secondary | ICD-10-CM

## 2023-01-03 DIAGNOSIS — I152 Hypertension secondary to endocrine disorders: Secondary | ICD-10-CM

## 2023-01-03 DIAGNOSIS — I959 Hypotension, unspecified: Secondary | ICD-10-CM | POA: Insufficient documentation

## 2023-01-03 DIAGNOSIS — E1159 Type 2 diabetes mellitus with other circulatory complications: Secondary | ICD-10-CM

## 2023-01-03 DIAGNOSIS — E039 Hypothyroidism, unspecified: Secondary | ICD-10-CM

## 2023-01-03 LAB — BASIC METABOLIC PANEL
BUN: 11 mg/dL (ref 6–23)
CO2: 28 meq/L (ref 19–32)
Calcium: 9.9 mg/dL (ref 8.4–10.5)
Chloride: 97 meq/L (ref 96–112)
Creatinine, Ser: 0.61 mg/dL (ref 0.40–1.20)
GFR: 101.36 mL/min (ref >60.00)
Glucose, Bld: 98 mg/dL (ref 70–99)
Potassium: 3.4 meq/L — ABNORMAL LOW (ref 3.5–5.1)
Sodium: 136 meq/L (ref 135–145)

## 2023-01-03 NOTE — Assessment & Plan Note (Signed)
She has lost 25 lb with GLP 1 agonist,  stopping metformin today given loose stools and A1c of 6.1  Lab Results  Component Value Date   HGBA1C 6.1 (A) 12/23/2022   Lab Results  Component Value Date   MICROALBUR 2.3 (H) 01/01/2023   MICROALBUR 2.9 (H) 04/08/2022

## 2023-01-03 NOTE — Assessment & Plan Note (Addendum)
Stopping losartan/hct and metoprolol for now given bradycardia, hypokalemia .  Will resume losartan at a lower dose if BP improves to 130/80 or higher

## 2023-01-03 NOTE — Progress Notes (Unsigned)
Subjective:  Patient ID: [name], female    DOB: October 19, 1970  Age: 52 y.o. MRN: [id number]  CC: The primary encounter diagnosis was Hypokalemia. Diagnoses of Diabetes mellitus treated with injections of non-insulin medication (HCC), Hypotension due to drugs, and Obesity, diabetes, and hypertension syndrome (HCC) were also pertinent to this visit.   HPI Kyrin Stohlman Wolaver presents for  Chief Complaint  Patient presents with  . Hospitalization Follow-up    ED follow up    Seen in ER on Oct 10 after developing pounding in chest. Had been seen one week prior for follow up and reported arrhythmias detercted by [facility] (bradycardia) . She was set up for ZIO .  1) ER:  noted to be severely hypokalemic K 2.5   Given IV 40 and po 40 and increased K in diet.  Repeat K on oct 16 3.3.  still taking losartan/hct  2) HTN:  low BP today on losartan hct and metoprolol .  Feels a bit off balance at night   3) Type 2 DM:  taking semiglutide from mail order pharmacy.  No hypoglycemic events.  Still taking metformin    Lab Results  Component Value Date   HGBA1C 6.1 (A) 12/23/2022     Outpatient Medications Prior to Visit  Medication Sig Dispense Refill  . ALPRAZolam (XANAX) 0.5 MG tablet Take 1 tablet (0.5 mg total) by mouth daily as needed for anxiety (or panic attacks). 30 tablet 02  . blood glucose meter kit and supplies Dispense based on patient and insurance preference. Use to check blood sugars twice daily. 1 each 0  . fexofenadine (ALLEGRA) 180 MG tablet Take 180 mg by mouth daily.    Marland Kitchen glucose blood test strip Use to check blood sugars twice daily. 100 each 5  . levothyroxine (SYNTHROID) 88 MCG tablet TAKE 1 TABLET EVERY DAY ON EMPTY STOMACHWITH A GLASS OF WATER AT LEAST 30-60 MINBEFORE BREAKFAST 90 tablet 1  . OVER THE COUNTER MEDICATION Take 1 tablet by mouth at bedtime. 0.3% THC Gummy    . POTASSIUM PO Take 650 mg by mouth daily. Pt states she is taking 2 tablets  daily.    . rosuvastatin (CRESTOR) 20 MG tablet TAKE 1 TABLET BY MOUTH DAILY. 90 tablet 1  . Semaglutide,0.25 or 0.5MG /DOS, 2 MG/3ML SOPN Inject 0.5 mg into the skin once a week. 3 mL 1  . sertraline (ZOLOFT) 50 MG tablet TAKE ONE AND ONE-HALF TABLETS DAILY 135 tablet 1  . losartan-hydrochlorothiazide (HYZAAR) 100-25 MG tablet Take 1 tablet by mouth daily. 90 tablet 1  . metFORMIN (GLUCOPHAGE) 500 MG tablet TAKE ONE TABLET TWICE A DAY WITH MEALS 180 tablet 1  . metoprolol succinate (TOPROL-XL) 25 MG 24 hr tablet TAKE ONE TABLET (25 MG) BY MOUTH EVERY DAY 90 tablet 1  . naproxen (NAPROSYN) 500 MG tablet Take 1 tablet (500 mg total) by mouth 2 (two) times daily with a meal. (Patient not taking: Reported on 01/03/2023) 20 tablet 0   No facility-administered medications prior to visit.    Review of Systems;  Patient denies headache, fevers, malaise, unintentional weight loss, skin rash, eye pain, sinus congestion and sinus pain, sore throat, dysphagia,  hemoptysis , cough, dyspnea, wheezing, chest pain, palpitations, orthopnea, edema, abdominal pain, nausea, melena, diarrhea, constipation, flank pain, dysuria, hematuria, urinary  Frequency, nocturia, numbness, tingling, seizures,  Focal weakness, Loss of consciousness,  Tremor, insomnia, depression, anxiety, and suicidal ideation.      Objective:  BP 96/80  Pulse 77   Ht 5\' 3"  (1.6 m)   Wt 192 lb 9.6 oz (87.4 kg)   LMP 01/20/2017 (Approximate)   SpO2 97%   BMI 34.12 kg/m   BP Readings from Last 3 Encounters:  01/03/23 96/80  12/27/22 (!) 120/92  12/23/22 114/86    Wt Readings from Last 3 Encounters:  01/03/23 192 lb 9.6 oz (87.4 kg)  12/26/22 191 lb (86.6 kg)  12/23/22 195 lb 6.4 oz (88.6 kg)    Physical Exam Vitals reviewed.  Constitutional:      General: She is not in acute distress.    Appearance: Normal appearance. She is normal weight. She is not ill-appearing, toxic-appearing or diaphoretic.  HENT:     Head:  Normocephalic.  Eyes:     General: No scleral icterus.       Right eye: No discharge.        Left eye: No discharge.     Conjunctiva/sclera: Conjunctivae normal.  Cardiovascular:     Rate and Rhythm: Normal rate and regular rhythm.     Heart sounds: Normal heart sounds.  Pulmonary:     Effort: Pulmonary effort is normal. No respiratory distress.     Breath sounds: Normal breath sounds.  Musculoskeletal:        General: Normal range of motion.  Skin:    General: Skin is warm and dry.  Neurological:     General: No focal deficit present.     Mental Status: She is alert and oriented to person, place, and time. Mental status is at baseline.  Psychiatric:        Mood and Affect: Mood normal.        Behavior: Behavior normal.        Thought Content: Thought content normal.        Judgment: Judgment normal.   Lab Results  Component Value Date   HGBA1C 6.1 (A) 12/23/2022   HGBA1C 8.1 (H) 04/03/2022   HGBA1C 7.5 (H) 03/20/2022    Lab Results  Component Value Date   CREATININE 0.68 01/01/2023   CREATININE 0.68 01/01/2023   CREATININE 0.62 12/26/2022    Lab Results  Component Value Date   WBC 8.5 12/26/2022   HGB 12.7 12/26/2022   HCT 34.7 (L) 12/26/2022   PLT 323 12/26/2022   GLUCOSE 102 (H) 01/01/2023   GLUCOSE 102 (H) 01/01/2023   CHOL 186 04/03/2022   TRIG 253.0 (H) 04/03/2022   HDL 41.40 04/03/2022   LDLDIRECT 106.0 04/03/2022   LDLCALC 100 (H) 10/04/2021   ALT 42 (H) 01/01/2023   AST 35 01/01/2023   NA 136 01/01/2023   NA 136 01/01/2023   K 3.3 (L) 01/01/2023   K 3.3 (L) 01/01/2023   CL 95 (L) 01/01/2023   CL 95 (L) 01/01/2023   CREATININE 0.68 01/01/2023   CREATININE 0.68 01/01/2023   BUN 8 01/01/2023   BUN 8 01/01/2023   CO2 32 01/01/2023   CO2 32 01/01/2023   TSH 1.47 01/01/2023   INR 0.9 02/07/2020   HGBA1C 6.1 (A) 12/23/2022   MICROALBUR 2.3 (H) 01/01/2023    No results found.  Assessment & Plan:  .Hypokalemia -     Basic metabolic panel -      Aldosterone + renin activity w/ ratio  Diabetes mellitus treated with injections of non-insulin medication (HCC) Assessment & Plan: She has lost 25 lb with GLP 1 agonist,  stopping metformin today given loose stools and A1c of 6.1  Lab Results  Component Value Date   HGBA1C 6.1 (A) 12/23/2022   Lab Results  Component Value Date   MICROALBUR 2.3 (H) 01/01/2023   MICROALBUR 2.9 (H) 04/08/2022        Hypotension due to drugs Assessment & Plan: Stopping losartan/hct and metoprolol for now given bradycardia, hypokalemia .  Will resume losartan at a lower dose if BP improves to 130/80 or higher      Obesity, diabetes, and hypertension syndrome (HCC) Assessment & Plan: She is satsified with a 25 lb weight loss and DM has been well controlled.  Stopping BP meds and metformin toay       I provided 30 minutes of face-to-face time during this encounter reviewing patient's last visit with me, patient's  most recent visit with cardiology,  nephrology,  and neurology,  recent surgical and non surgical procedures, previous  labs and imaging studies, counseling on currently addressed issues,  and post visit ordering to diagnostics and therapeutics .   Follow-up: No follow-ups on file.   Sherlene Shams, MD

## 2023-01-03 NOTE — Patient Instructions (Addendum)
Stop metoprolol . This should improve the heart rate   Stop the losartan/hct as well.  Follow BP readings and send me  readings in 2 weeks   Continue the potassium supplement that you are doing now until  you hear from  me   STOP THE METFORMIN as  WELL TO SEE IF THE BOWELS IMPROVE in consistency

## 2023-01-03 NOTE — Assessment & Plan Note (Signed)
She is satsified with a 25 lb weight loss and DM has been well controlled.  Stopping BP meds and metformin today

## 2023-01-05 MED ORDER — POTASSIUM CHLORIDE CRYS ER 20 MEQ PO TBCR
20.0000 meq | EXTENDED_RELEASE_TABLET | Freq: Every day | ORAL | 0 refills | Status: AC
Start: 2023-01-05 — End: 2023-10-20

## 2023-01-05 NOTE — Assessment & Plan Note (Addendum)
Severe, noted in ER .  Still slightly low. Stopping diuretic,  checking aldosterone level  Lab Results  Component Value Date   NA 136 01/03/2023   K 3.4 (L) 01/03/2023   CL 97 01/03/2023   CO2 28 01/03/2023

## 2023-01-05 NOTE — Assessment & Plan Note (Signed)
Resolved with weight loss of over 10% body weight since Jan 2024.  All medications have been suspended

## 2023-01-05 NOTE — Assessment & Plan Note (Signed)
Thyroid function has been  is WNL on 88 mcg  Daily  dose.  No current changes needed.   Lab Results  Component Value Date   TSH 1.47 01/01/2023

## 2023-01-05 NOTE — Addendum Note (Signed)
Addended by: Sherlene Shams on: 01/05/2023 10:02 PM   Modules accepted: Orders

## 2023-01-06 ENCOUNTER — Telehealth: Payer: Self-pay

## 2023-01-06 DIAGNOSIS — E876 Hypokalemia: Secondary | ICD-10-CM

## 2023-01-06 NOTE — Telephone Encounter (Signed)
Lab order placed for lab appt.  °

## 2023-01-07 LAB — ALDOSTERONE + RENIN ACTIVITY W/ RATIO
ALDO / PRA Ratio: 1.5 {ratio} (ref 0.9–28.9)
Aldosterone: 19 ng/dL
Renin Activity: 12.47 ng/mL/h — ABNORMAL HIGH (ref 0.25–5.82)

## 2023-01-07 NOTE — Assessment & Plan Note (Signed)
SCREEN FOR HYPERALDOSTERONISM NEGATIVE OCT 2024

## 2023-01-08 LAB — ALDOSTERONE + RENIN ACTIVITY W/ RATIO
ALDO / PRA Ratio: 2 {ratio} (ref 0.9–28.9)
Aldosterone: 24 ng/dL
Renin Activity: 11.79 ng/mL/h — ABNORMAL HIGH (ref 0.25–5.82)

## 2023-01-10 ENCOUNTER — Telehealth: Payer: Self-pay | Admitting: Internal Medicine

## 2023-01-10 NOTE — Telephone Encounter (Signed)
Patient called and stated provider wanted a report on bp reading in two weeks. Patient felt this week they have been high. Readings are below  Morning of 01/10/23 at 11:45am 135/100 Night of 01/09/23 at 6pm 128/98  Monday at 2pm 125/88  She has seen an increase and would like to know what provider thinks.

## 2023-01-14 NOTE — Telephone Encounter (Signed)
Patient understands and is agreeable to take and send 5 BP readings total.

## 2023-01-20 ENCOUNTER — Other Ambulatory Visit (INDEPENDENT_AMBULATORY_CARE_PROVIDER_SITE_OTHER): Payer: Commercial Managed Care - PPO

## 2023-01-20 DIAGNOSIS — E876 Hypokalemia: Secondary | ICD-10-CM

## 2023-01-21 LAB — BASIC METABOLIC PANEL
BUN: 11 mg/dL (ref 6–23)
CO2: 28 meq/L (ref 19–32)
Calcium: 10.2 mg/dL (ref 8.4–10.5)
Chloride: 99 meq/L (ref 96–112)
Creatinine, Ser: 0.73 mg/dL (ref 0.40–1.20)
GFR: 93.21 mL/min (ref >60.00)
Glucose, Bld: 119 mg/dL — ABNORMAL HIGH (ref 70–99)
Potassium: 3.9 meq/L (ref 3.5–5.1)
Sodium: 137 meq/L (ref 135–145)

## 2023-01-23 ENCOUNTER — Ambulatory Visit
Admission: RE | Admit: 2023-01-23 | Discharge: 2023-01-23 | Disposition: A | Discharge status: Home / Self Care | Payer: Commercial Managed Care - PPO | Source: Ambulatory Visit | Attending: General Surgery | Admitting: General Surgery

## 2023-01-29 ENCOUNTER — Other Ambulatory Visit: Payer: Self-pay | Admitting: Internal Medicine

## 2023-01-29 DIAGNOSIS — E782 Mixed hyperlipidemia: Secondary | ICD-10-CM

## 2023-04-01 ENCOUNTER — Other Ambulatory Visit: Payer: Self-pay | Admitting: General Surgery

## 2023-04-01 DIAGNOSIS — R92343 Mammographic extreme density, bilateral breasts: Secondary | ICD-10-CM

## 2023-04-01 DIAGNOSIS — Z809 Family history of malignant neoplasm, unspecified: Secondary | ICD-10-CM

## 2023-04-01 DIAGNOSIS — N6489 Other specified disorders of breast: Secondary | ICD-10-CM

## 2023-04-22 ENCOUNTER — Other Ambulatory Visit: Payer: Self-pay | Admitting: Internal Medicine

## 2023-05-17 ENCOUNTER — Ambulatory Visit
Admission: RE | Admit: 2023-05-17 | Discharge: 2023-05-17 | Disposition: A | Discharge status: Home / Self Care | Payer: Commercial Managed Care - PPO | Source: Ambulatory Visit | Attending: General Surgery | Admitting: General Surgery

## 2023-05-17 DIAGNOSIS — Z809 Family history of malignant neoplasm, unspecified: Secondary | ICD-10-CM

## 2023-05-17 DIAGNOSIS — N6489 Other specified disorders of breast: Secondary | ICD-10-CM

## 2023-05-17 DIAGNOSIS — R92343 Mammographic extreme density, bilateral breasts: Secondary | ICD-10-CM

## 2023-05-17 MED ORDER — GADOPICLENOL 0.5 MMOL/ML IV SOLN
7.5000 mL | Freq: Once | INTRAVENOUS | Status: AC | PRN
  Administered 2023-05-17: 7 mL via INTRAVENOUS

## 2023-05-29 LAB — OPHTHALMOLOGY REPORT-SCANNED

## 2023-07-21 ENCOUNTER — Other Ambulatory Visit: Payer: Self-pay | Admitting: Internal Medicine

## 2023-09-09 ENCOUNTER — Telehealth: Payer: Self-pay | Admitting: Internal Medicine

## 2023-09-09 ENCOUNTER — Other Ambulatory Visit

## 2023-09-09 DIAGNOSIS — E1151 Type 2 diabetes mellitus with diabetic peripheral angiopathy without gangrene: Secondary | ICD-10-CM

## 2023-09-09 DIAGNOSIS — I10 Essential (primary) hypertension: Secondary | ICD-10-CM

## 2023-09-09 NOTE — Telephone Encounter (Signed)
 Patient need labs ordered

## 2023-09-11 ENCOUNTER — Other Ambulatory Visit (INDEPENDENT_AMBULATORY_CARE_PROVIDER_SITE_OTHER)

## 2023-09-11 DIAGNOSIS — I10 Essential (primary) hypertension: Secondary | ICD-10-CM | POA: Diagnosis not present

## 2023-09-11 DIAGNOSIS — E1151 Type 2 diabetes mellitus with diabetic peripheral angiopathy without gangrene: Secondary | ICD-10-CM | POA: Diagnosis not present

## 2023-09-11 LAB — LIPID PANEL W/REFLEX DIRECT LDL
Cholesterol: 200 mg/dL — ABNORMAL HIGH (ref <200)
HDL: 59 mg/dL (ref >=50)
LDL Cholesterol (Calc): 111 mg/dL — ABNORMAL HIGH
Non-HDL Cholesterol (Calc): 141 mg/dL — ABNORMAL HIGH (ref <130)
Total CHOL/HDL Ratio: 3.4 (calc) (ref <5.0)
Triglycerides: 187 mg/dL — ABNORMAL HIGH (ref <150)

## 2023-09-11 LAB — COMPREHENSIVE METABOLIC PANEL WITH GFR
ALT: 17 U/L (ref 0–35)
AST: 18 U/L (ref 0–37)
Albumin: 4.6 g/dL (ref 3.5–5.2)
Alkaline Phosphatase: 85 U/L (ref 39–117)
BUN: 15 mg/dL (ref 6–23)
CO2: 28 meq/L (ref 19–32)
Calcium: 10 mg/dL (ref 8.4–10.5)
Chloride: 99 meq/L (ref 96–112)
Creatinine, Ser: 0.68 mg/dL (ref 0.40–1.20)
GFR: 98.27 mL/min (ref >60.00)
Glucose, Bld: 113 mg/dL — ABNORMAL HIGH (ref 70–99)
Potassium: 3.9 meq/L (ref 3.5–5.1)
Sodium: 137 meq/L (ref 135–145)
Total Bilirubin: 0.5 mg/dL (ref 0.2–1.2)
Total Protein: 7.5 g/dL (ref 6.0–8.3)

## 2023-09-11 LAB — MICROALBUMIN / CREATININE URINE RATIO
Creatinine,U: 128.9 mg/dL
Microalb Creat Ratio: 6 mg/g (ref 0.0–30.0)
Microalb, Ur: 0.8 mg/dL (ref 0.0–1.9)

## 2023-09-11 LAB — HEMOGLOBIN A1C: Hgb A1c MFr Bld: 6.1 % (ref 4.6–6.5)

## 2023-09-14 ENCOUNTER — Ambulatory Visit: Payer: Self-pay | Admitting: Internal Medicine

## 2023-09-29 ENCOUNTER — Other Ambulatory Visit: Payer: Self-pay | Admitting: Internal Medicine

## 2023-09-29 ENCOUNTER — Telehealth: Payer: Self-pay

## 2023-09-29 MED ORDER — LOSARTAN POTASSIUM-HCTZ 100-25 MG PO TABS: 1.0000 | ORAL_TABLET | Freq: Every day | ORAL | 0 refills | Status: DC

## 2023-09-29 NOTE — Telephone Encounter (Signed)
 Copied from CRM [id number]. Topic: Clinical - Prescription Issue >> Sep 29, 2023  2:10 PM Christina Stafford wrote: Reason for CRM: Patient is calling to get the Refused Losartan  Potassium-HCTZ 1 clarified for denial, I see it's not on her medication lists.  She would just like a call to see if she is still supposed to be taking it or not

## 2023-09-29 NOTE — Addendum Note (Signed)
 Addended by: Jesusita Jocelyn on: 09/29/2023 05:31 PM   Modules accepted: Orders

## 2023-09-29 NOTE — Telephone Encounter (Signed)
 Spoke with pt to let her know that per Dr. Marylynn she is to continue the losartan /hydrochlorothiazide . I have d/c'd the metoprolol  and plain hydrochlorothiazide . Pt gave a verbal understanding.

## 2023-09-29 NOTE — Telephone Encounter (Signed)
 Spoke with pt to let her know that per her last office note in October 2024 she was having low bp readings so the losartan /hydrochlorothiazide  and metoprolol  were stopped and was advised that if the bp went back up to 130/80 or higher then you would prescribe her a plain losartan . However pt never stopped the losartan /hydrochlorothiazide  but did stop the metoprolol . Pt stated that she is not having any low bp readings and she has not had any feelings of being off balance like she was at her last appt. Pt is wanting to know if she should continue taking the losartan /hydrochlorothiazide  or stop it because the refill request was denied.   Per pt's medication list she is taking plain hydrochlorothiazide , however pt is not and is only taking the losartan /hydrochlorothiazide .

## 2023-10-18 ENCOUNTER — Other Ambulatory Visit: Payer: Self-pay | Admitting: Internal Medicine

## 2023-10-20 ENCOUNTER — Ambulatory Visit: Admitting: Internal Medicine

## 2023-10-20 ENCOUNTER — Encounter: Payer: Self-pay | Admitting: Internal Medicine

## 2023-10-20 VITALS — BP 106/64 | HR 84 | Ht 63.0 in | Wt 193.4 lb

## 2023-10-20 DIAGNOSIS — I152 Hypertension secondary to endocrine disorders: Secondary | ICD-10-CM

## 2023-10-20 DIAGNOSIS — E1159 Type 2 diabetes mellitus with other circulatory complications: Secondary | ICD-10-CM

## 2023-10-20 DIAGNOSIS — E782 Mixed hyperlipidemia: Secondary | ICD-10-CM

## 2023-10-20 DIAGNOSIS — E669 Obesity, unspecified: Secondary | ICD-10-CM

## 2023-10-20 DIAGNOSIS — E039 Hypothyroidism, unspecified: Secondary | ICD-10-CM | POA: Diagnosis not present

## 2023-10-20 DIAGNOSIS — Z6834 Body mass index (BMI) 34.0-34.9, adult: Secondary | ICD-10-CM

## 2023-10-20 DIAGNOSIS — Z7984 Long term (current) use of oral hypoglycemic drugs: Secondary | ICD-10-CM | POA: Diagnosis not present

## 2023-10-20 DIAGNOSIS — E1169 Type 2 diabetes mellitus with other specified complication: Secondary | ICD-10-CM | POA: Diagnosis not present

## 2023-10-20 MED ORDER — SERTRALINE HCL 50 MG PO TABS: ORAL_TABLET | ORAL | 1 refills | Status: AC

## 2023-10-20 MED ORDER — METFORMIN HCL 500 MG PO TABS: ORAL_TABLET | ORAL | 1 refills | Status: AC

## 2023-10-20 MED ORDER — ROSUVASTATIN CALCIUM 40 MG PO TABS: 40.0000 mg | ORAL_TABLET | Freq: Every day | ORAL | 1 refills | Status: DC

## 2023-10-20 MED ORDER — BLOOD GLUCOSE TEST VI STRP: 1.0000 | ORAL_STRIP | Freq: Three times a day (TID) | 3 refills | Status: AC

## 2023-10-20 MED ORDER — LANCETS MISC. MISC: 1.0000 | Freq: Three times a day (TID) | 3 refills | Status: AC

## 2023-10-20 MED ORDER — BLOOD GLUCOSE MONITORING SUPPL DEVI: 1.0000 | Freq: Three times a day (TID) | 0 refills | Status: AC

## 2023-10-20 MED ORDER — LEVOTHYROXINE SODIUM 88 MCG PO TABS: ORAL_TABLET | ORAL | 1 refills | Status: AC

## 2023-10-20 MED ORDER — LOSARTAN POTASSIUM-HCTZ 50-12.5 MG PO TABS: 1.0000 | ORAL_TABLET | Freq: Every day | ORAL | 1 refills | Status: AC

## 2023-10-20 NOTE — Patient Instructions (Addendum)
 I have Reduced your dose of losartan  today to 50/12.5 mg .  Please monitor home BP readings (goal is to keep BP  < 130/80)  Increase your crestor  dose to 40 mg for goal LDL of 70  Repeat labs in 3 months,  OV in 6   I will refill the ozempic  at whatever dose you tell me to ( 1 mg vs 2 mg )

## 2023-10-20 NOTE — Progress Notes (Unsigned)
 Subjective:  Patient ID: Christina Stafford, female    DOB: 1970/09/17  Age: 53 y.o. MRN: [id number]  CC: The primary encounter diagnosis was Acquired hypothyroidism. Diagnoses of Mixed hyperlipidemia and Obesity, diabetes, and hypertension syndrome (HCC) were also pertinent to this visit.   HPI Christina Stafford presents for  Chief Complaint  Patient presents with   Medical Management of Chronic Issues   1) TYPE 2 DM/OBESITY:  She  feels generally well, is exercising several times per week and checking blood sugars once daily at variable times.  BS have been under 130 fasting and < 150 post prandially.  Denies any recent hypoglyemic events.  Taking metformin  and ozempic  as directed, currently on 1 mg weekly . Following a carbohydrate modified diet 6 days per week. Denies numbness, burning and tingling of extremities. Appetite is reduced using mounjaro  .     Reviewed recent labs .  LDL is > 70, on crestor  20 mg daily      2) Hypertension: patient checks blood pressure twice weekly at home.  Readings have been for the most part <110/70 at rest . Patient is following a reduced salt diet most days and is taking medications as prescribed  (losartan  100/25)   3) infrequent  EPISODES OF HEART FLUTTERING   APPLE WATCH RECORDED A RATE OF 50 WHILE VACATIONING IN [place] .  NO CHEST PAIN,  NO shortness of breath ,  has not recurred since    Outpatient Medications Prior to Visit  Medication Sig Dispense Refill   ALPRAZolam  (XANAX ) 0.5 MG tablet Take 1 tablet (0.5 mg total) by mouth daily as needed for anxiety (or panic attacks). 30 tablet 02   blood glucose meter kit and supplies Dispense based on patient and insurance preference. Use to check blood sugars twice daily. 1 each 0   fexofenadine (ALLEGRA) 180 MG tablet Take 180 mg by mouth daily.     OVER THE COUNTER MEDICATION Take 1 tablet by mouth at bedtime. 0.3% THC Gummy     potassium chloride  SA (KLOR-CON  M) 20 MEQ tablet Take 1 tablet  (20 mEq total) by mouth daily for 5 days. 5 tablet 0   POTASSIUM PO Take 650 mg by mouth daily. Pt states she is taking 2 tablets daily.     Semaglutide ,0.25 or 0.5MG /DOS, 2 MG/3ML SOPN Inject 0.5 mg into the skin once a week. 3 mL 1   glucose blood test strip Use to check blood sugars twice daily. 100 each 5   levothyroxine  (SYNTHROID ) 88 MCG tablet TAKE 1 TABLET BY MOUTH ONCE DAILY. TAKE ON EMPTY STOMACH WITH A GLASS OF WATER AT LEAST 30-60 MINUTES BEFORE BREAKFAST 90 tablet 1   losartan -hydrochlorothiazide  (HYZAAR) 100-25 MG tablet Take 1 tablet by mouth daily. 90 tablet 0   metFORMIN  (GLUCOPHAGE ) 500 MG tablet TAKE ONE TABLET TWICE A DAY WITH MEALS 180 tablet 1   rosuvastatin  (CRESTOR ) 20 MG tablet TAKE 1 TABLET BY MOUTH DAILY. 90 tablet 1   sertraline  (ZOLOFT ) 50 MG tablet TAKE ONE AND ONE-HALF TABLETS DAILY 135 tablet 1   No facility-administered medications prior to visit.    Review of Systems;  Patient denies headache, fevers, malaise, unintentional weight loss, skin rash, eye pain, sinus congestion and sinus pain, sore throat, dysphagia,  hemoptysis , cough, dyspnea, wheezing, chest pain, palpitations, orthopnea, edema, abdominal pain, nausea, melena, diarrhea, constipation, flank pain, dysuria, hematuria, urinary  Frequency, nocturia, numbness, tingling, seizures,  Focal weakness, Loss of consciousness,  Tremor, insomnia, depression, anxiety, and  suicidal ideation.      Objective:  BP 106/64   Pulse 84   Ht 5' 3 (1.6 m)   Wt 193 lb 6.4 oz (87.7 kg)   LMP 01/20/2017 (Approximate)   SpO2 97%   BMI 34.26 kg/m   BP Readings from Last 3 Encounters:  10/20/23 106/64  01/03/23 96/80  12/27/22 (!) 120/92    Wt Readings from Last 3 Encounters:  10/20/23 193 lb 6.4 oz (87.7 kg)  01/03/23 192 lb 9.6 oz (87.4 kg)  12/26/22 191 lb (86.6 kg)    Physical Exam Vitals reviewed.  Constitutional:      General: She is not in acute distress.    Appearance: Normal appearance. She is  normal weight. She is not ill-appearing, toxic-appearing or diaphoretic.  HENT:     Head: Normocephalic.  Eyes:     General: No scleral icterus.       Right eye: No discharge.        Left eye: No discharge.     Conjunctiva/sclera: Conjunctivae normal.  Cardiovascular:     Rate and Rhythm: Normal rate and regular rhythm.     Heart sounds: Normal heart sounds.  Pulmonary:     Effort: Pulmonary effort is normal. No respiratory distress.     Breath sounds: Normal breath sounds.  Musculoskeletal:        General: Normal range of motion.  Skin:    General: Skin is warm and dry.  Neurological:     General: No focal deficit present.     Mental Status: She is alert and oriented to person, place, and time. Mental status is at baseline.  Psychiatric:        Mood and Affect: Mood normal.        Behavior: Behavior normal.        Thought Content: Thought content normal.        Judgment: Judgment normal.     Lab Results  Component Value Date   HGBA1C 6.1 09/11/2023   HGBA1C 6.1 (A) 12/23/2022   HGBA1C 8.1 (H) 04/03/2022    Lab Results  Component Value Date   CREATININE 0.68 09/11/2023   CREATININE 0.73 01/20/2023   CREATININE 0.61 01/03/2023    Lab Results  Component Value Date   WBC 8.5 12/26/2022   HGB 12.7 12/26/2022   HCT 34.7 (L) 12/26/2022   PLT 323 12/26/2022   GLUCOSE 113 (H) 09/11/2023   CHOL 200 (H) 09/11/2023   TRIG 187 (H) 09/11/2023   HDL 59 09/11/2023   LDLDIRECT 106.0 04/03/2022   LDLCALC 111 (H) 09/11/2023   ALT 17 09/11/2023   AST 18 09/11/2023   NA 137 09/11/2023   K 3.9 09/11/2023   CL 99 09/11/2023   CREATININE 0.68 09/11/2023   BUN 15 09/11/2023   CO2 28 09/11/2023   TSH 1.47 01/01/2023   INR 0.9 02/07/2020   HGBA1C 6.1 09/11/2023   MICROALBUR 0.8 09/11/2023    MR BREAST BILATERAL W WO CONTRAST INC CAD Result Date: 05/19/2023 CLINICAL DATA:  53 year old female presents for screening breast MRI. Family history of breast cancer and dense breast  tissue. History of LEFT breast radial scar with excision in 2024. EXAM: BILATERAL BREAST MRI WITH AND WITHOUT CONTRAST TECHNIQUE: Multiplanar, multisequence MR images of both breasts were obtained prior to and following the intravenous administration of 7 ml of Vueway  Three-dimensional MR images were rendered by post-processing of the original MR data on an independent workstation. The three-dimensional MR images were interpreted, and  findings are reported in the following complete MRI report for this study. Three dimensional images were evaluated at the independent interpreting workstation using the DynaCAD thin client. COMPARISON:  02/12/2022 MR. Prior mammograms. FINDINGS: Breast composition: d. Extreme fibroglandular tissue. Background parenchymal enhancement: Moderate Right breast: No suspicious mass or worrisome enhancement. Left breast: No suspicious mass or worrisome enhancement. Surgical changes noted. Lymph nodes: No abnormal appearing lymph nodes. Ancillary findings:  None. IMPRESSION: No MR evidence of breast malignancy. RECOMMENDATION: Bilateral screening mammogram in 8 months to resume annual mammogram schedule. Bilateral screening breast MRI in 1 year if this patient is at high lifetime risk for developing breast cancer. BI-RADS CATEGORY  2: Benign. Electronically Signed   By: Reyes Phi M.D.   On: 05/19/2023 13:38    Assessment & Plan:  .Acquired hypothyroidism -     TSH  Mixed hyperlipidemia  Obesity, diabetes, and hypertension syndrome (HCC) Assessment & Plan: Continue ozempic  and metformin . Reduce losartan  dose and increase statin for goal ldl 70 or less  Lab Results  Component Value Date   HGBA1C 6.1 09/11/2023   Lab Results  Component Value Date   MICROALBUR 0.8 09/11/2023     Lab Results  Component Value Date   CHOL 200 (H) 09/11/2023   HDL 59 09/11/2023   LDLCALC 111 (H) 09/11/2023   LDLDIRECT 106.0 04/03/2022   TRIG 187 (H) 09/11/2023   CHOLHDL 3.4 09/11/2023       Other orders -     Levothyroxine  Sodium; TAKE 1 TABLET BY MOUTH ONCE DAILY. TAKE ON EMPTY STOMACH WITH A GLASS OF WATER AT LEAST 30-60 MINUTES BEFORE BREAKFAST  Dispense: 90 tablet; Refill: 1 -     metFORMIN  HCl; TAKE ONE TABLET TWICE A DAY WITH MEALS  Dispense: 180 tablet; Refill: 1 -     Sertraline  HCl; TAKE ONE AND ONE-HALF TABLETS DAILY  Dispense: 135 tablet; Refill: 1 -     Blood Glucose Monitoring Suppl; 1 each by Does not apply route in the morning, at noon, and at bedtime. May substitute to any manufacturer covered by patient's insurance.  Dispense: 1 each; Refill: 0 -     Blood Glucose Test; 1 each by In Vitro route in the morning, at noon, and at bedtime. May substitute to any manufacturer covered by patient's insurance.  Dispense: 300 each; Refill: 3 -     Lancets Misc.; 1 each by Does not apply route in the morning, at noon, and at bedtime. May substitute to any manufacturer covered by patient's insurance.  Dispense: 300 each; Refill: 3 -     Losartan  Potassium-HCTZ; Take 1 tablet by mouth daily.  Dispense: 90 tablet; Refill: 1 -     Rosuvastatin  Calcium ; Take 1 tablet (40 mg total) by mouth daily.  Dispense: 90 tablet; Refill: 1     I spent 34 minutes on the day of this face to face encounter reviewing patient's  most recent visit with cardiology,  nephrology,  and neurology,  prior relevant surgical and non surgical procedures, recent  labs and imaging studies, counseling on weight management,  reviewing the assessment and plan with patient, and post visit ordering and reviewing of  diagnostics and therapeutics with patient  .   Follow-up: Return in about 6 months (around 04/21/2024) for follow up diabetes.   Verneita LITTIE Kettering, MD

## 2023-10-21 NOTE — Assessment & Plan Note (Signed)
 Continue ozempic  and metformin . Reduce losartan  dose and increase statin for goal ldl 70 or less  Lab Results  Component Value Date   HGBA1C 6.1 09/11/2023   Lab Results  Component Value Date   MICROALBUR 0.8 09/11/2023     Lab Results  Component Value Date   CHOL 200 (H) 09/11/2023   HDL 59 09/11/2023   LDLCALC 111 (H) 09/11/2023   LDLDIRECT 106.0 04/03/2022   TRIG 187 (H) 09/11/2023   CHOLHDL 3.4 09/11/2023

## 2023-11-19 ENCOUNTER — Encounter: Payer: Self-pay | Admitting: Internal Medicine

## 2023-11-20 ENCOUNTER — Other Ambulatory Visit: Payer: Self-pay | Admitting: Family

## 2023-11-20 MED ORDER — SEMAGLUTIDE (2 MG/DOSE) 8 MG/3ML ~~LOC~~ SOPN: 2.0000 mg | PEN_INJECTOR | SUBCUTANEOUS | 1 refills | Status: DC

## 2023-11-20 NOTE — Telephone Encounter (Signed)
 Pt would like to increase Ozempic  to the 2 mg dose.

## 2024-01-29 ENCOUNTER — Other Ambulatory Visit: Payer: Self-pay | Admitting: Internal Medicine

## 2024-01-29 NOTE — Telephone Encounter (Unsigned)
 Copied from CRM #[id number]. Topic: Clinical - Medication Refill >> Jan 29, 2024  3:45 PM Suzen RAMAN wrote: Medication: Semaglutide , 2 MG/DOSE, 8 MG/3ML SOPN   Has the patient contacted their pharmacy? Yes   This is the patient's preferred pharmacy:  [facility] [address] [facility] - [place], [place] - [address] [facility] [address] [place] [address] Phone: [phone] Fax: [phone]  Is this the correct pharmacy for this prescription? Yes If no, delete pharmacy and type the correct one.   Has the prescription been filled recently? No  Is the patient out of the medication? Yes  Has the patient been seen for an appointment in the last year OR does the patient have an upcoming appointment? Yes  Can we respond through MyChart? Yes  Agent: Please be advised that Rx refills may take up to 3 business days. We ask that you follow-up with your pharmacy.

## 2024-02-02 MED ORDER — SEMAGLUTIDE (2 MG/DOSE) 8 MG/3ML ~~LOC~~ SOPN: 2.0000 mg | PEN_INJECTOR | SUBCUTANEOUS | 1 refills | Status: DC

## 2024-02-23 ENCOUNTER — Other Ambulatory Visit: Payer: Self-pay | Admitting: General Surgery

## 2024-02-23 DIAGNOSIS — Z1231 Encounter for screening mammogram for malignant neoplasm of breast: Secondary | ICD-10-CM

## 2024-03-22 ENCOUNTER — Ambulatory Visit
Admission: RE | Admit: 2024-03-22 | Discharge: 2024-03-22 | Disposition: A | Discharge status: Home / Self Care | Source: Ambulatory Visit | Attending: General Surgery | Admitting: General Surgery

## 2024-03-22 DIAGNOSIS — Z1231 Encounter for screening mammogram for malignant neoplasm of breast: Secondary | ICD-10-CM

## 2024-04-08 ENCOUNTER — Telehealth: Payer: Self-pay

## 2024-04-08 DIAGNOSIS — E039 Hypothyroidism, unspecified: Secondary | ICD-10-CM

## 2024-04-08 DIAGNOSIS — E785 Hyperlipidemia, unspecified: Secondary | ICD-10-CM

## 2024-04-08 DIAGNOSIS — E119 Type 2 diabetes mellitus without complications: Secondary | ICD-10-CM

## 2024-04-08 DIAGNOSIS — R5383 Other fatigue: Secondary | ICD-10-CM

## 2024-04-08 NOTE — Telephone Encounter (Signed)
 Copied from CRM #[id number]. Topic: Clinical - Request for Lab/Test Order >> Apr 08, 2024 11:41 AM Christina Stafford wrote: Reason for CRM: Patient wanted to get labs done prior to her coming to see Dr. Marylynn. Patient requested Dr. Marylynn order the labs and then have someone call her to let her know its okay to schedule. I informed patient I could not schedule her unless there were active labs in the chart and she understood. Patient can be reached at [phone]. She also states that her pharmacy sent over a couple of medication refills, and that she does have an appt scheduled so she's hoping they would get approved as well.

## 2024-04-09 ENCOUNTER — Other Ambulatory Visit: Payer: Self-pay | Admitting: Internal Medicine

## 2024-04-09 ENCOUNTER — Other Ambulatory Visit: Payer: Self-pay | Admitting: Family

## 2024-04-12 NOTE — Addendum Note (Signed)
 Addended by: MARYLYNN VERNEITA CROME on: 04/12/2024 04:16 PM   Modules accepted: Orders

## 2024-04-12 NOTE — Telephone Encounter (Signed)
 I have pended labs for your approval. If needed will schedule pt for a lab appt.

## 2024-04-12 NOTE — Addendum Note (Signed)
 Addended by: HARRIETTE RAISIN on: 04/12/2024 02:26 PM   Modules accepted: Orders

## 2024-04-12 NOTE — Telephone Encounter (Signed)
 Please scheduled pt a lab appt before her next appt.

## 2024-05-06 ENCOUNTER — Other Ambulatory Visit

## 2024-05-11 ENCOUNTER — Ambulatory Visit: Admitting: Internal Medicine
